# Patient Record
Sex: Female | Born: 1950 | Race: White | Hispanic: No | Smoking: Current every day smoker
Health system: Southern US, Community
[De-identification: ages and names within clinical notes are randomized; demographics above are authoritative.]

## PROBLEM LIST (undated history)

## (undated) DIAGNOSIS — H269 Unspecified cataract: Secondary | ICD-10-CM

## (undated) DIAGNOSIS — I10 Essential (primary) hypertension: Secondary | ICD-10-CM

## (undated) DIAGNOSIS — J45909 Unspecified asthma, uncomplicated: Secondary | ICD-10-CM

## (undated) DIAGNOSIS — E079 Disorder of thyroid, unspecified: Secondary | ICD-10-CM

## (undated) DIAGNOSIS — E785 Hyperlipidemia, unspecified: Secondary | ICD-10-CM

## (undated) DIAGNOSIS — M8580 Other specified disorders of bone density and structure, unspecified site: Secondary | ICD-10-CM

## (undated) DIAGNOSIS — M1990 Unspecified osteoarthritis, unspecified site: Secondary | ICD-10-CM

## (undated) DIAGNOSIS — J449 Chronic obstructive pulmonary disease, unspecified: Secondary | ICD-10-CM

## (undated) DIAGNOSIS — K219 Gastro-esophageal reflux disease without esophagitis: Secondary | ICD-10-CM

## (undated) DIAGNOSIS — C801 Malignant (primary) neoplasm, unspecified: Secondary | ICD-10-CM

## (undated) HISTORY — PX: POLYPECTOMY: SHX149

## (undated) HISTORY — PX: BREAST RECONSTRUCTION: SHX9

## (undated) HISTORY — DX: Unspecified osteoarthritis, unspecified site: M19.90

## (undated) HISTORY — PX: COLONOSCOPY: SHX174

## (undated) HISTORY — DX: Unspecified cataract: H26.9

## (undated) HISTORY — DX: Hyperlipidemia, unspecified: E78.5

## (undated) HISTORY — PX: BILATERAL TOTAL MASTECTOMY WITH AXILLARY LYMPH NODE DISSECTION: SHX6364

## (undated) HISTORY — DX: Other specified disorders of bone density and structure, unspecified site: M85.80

## (undated) HISTORY — DX: Essential (primary) hypertension: I10

## (undated) HISTORY — DX: Gastro-esophageal reflux disease without esophagitis: K21.9

## (undated) HISTORY — DX: Disorder of thyroid, unspecified: E07.9

## (undated) HISTORY — DX: Chronic obstructive pulmonary disease, unspecified: J44.9

## (undated) HISTORY — DX: Malignant (primary) neoplasm, unspecified: C80.1

---

## 1999-07-16 ENCOUNTER — Encounter: Admission: RE | Admit: 1999-07-16 | Discharge: 1999-10-14 | Payer: Self-pay | Admitting: Neurology

## 1999-09-14 ENCOUNTER — Other Ambulatory Visit: Admission: RE | Admit: 1999-09-14 | Discharge: 1999-09-14 | Payer: Self-pay | Admitting: *Deleted

## 1999-11-27 ENCOUNTER — Ambulatory Visit (HOSPITAL_BASED_OUTPATIENT_CLINIC_OR_DEPARTMENT_OTHER): Admission: RE | Admit: 1999-11-27 | Discharge: 1999-11-27 | Payer: Self-pay | Admitting: *Deleted

## 2000-04-01 ENCOUNTER — Ambulatory Visit (HOSPITAL_COMMUNITY): Admission: RE | Admit: 2000-04-01 | Discharge: 2000-04-01 | Payer: Self-pay | Admitting: *Deleted

## 2001-05-25 ENCOUNTER — Other Ambulatory Visit: Admission: RE | Admit: 2001-05-25 | Discharge: 2001-05-25 | Payer: Self-pay | Admitting: Family Medicine

## 2002-10-01 ENCOUNTER — Other Ambulatory Visit: Admission: RE | Admit: 2002-10-01 | Discharge: 2002-10-01 | Payer: Self-pay | Admitting: Family Medicine

## 2005-01-29 ENCOUNTER — Other Ambulatory Visit: Admission: RE | Admit: 2005-01-29 | Discharge: 2005-01-29 | Payer: Self-pay | Admitting: Family Medicine

## 2006-10-07 ENCOUNTER — Other Ambulatory Visit: Admission: RE | Admit: 2006-10-07 | Discharge: 2006-10-07 | Payer: Self-pay | Admitting: Family Medicine

## 2007-03-19 ENCOUNTER — Ambulatory Visit (HOSPITAL_COMMUNITY): Admission: RE | Admit: 2007-03-19 | Discharge: 2007-03-19 | Payer: Self-pay | Admitting: Family Medicine

## 2007-07-06 ENCOUNTER — Encounter: Admission: RE | Admit: 2007-07-06 | Discharge: 2007-08-03 | Payer: Self-pay | Admitting: Orthopedic Surgery

## 2011-02-23 ENCOUNTER — Emergency Department (HOSPITAL_COMMUNITY)
Admission: EM | Admit: 2011-02-23 | Discharge: 2011-02-23 | Disposition: A | Discharge status: Home / Self Care | Payer: Worker's Compensation | Attending: Emergency Medicine | Admitting: Emergency Medicine

## 2011-02-23 DIAGNOSIS — W268XXA Contact with other sharp object(s), not elsewhere classified, initial encounter: Secondary | ICD-10-CM | POA: Insufficient documentation

## 2011-02-23 DIAGNOSIS — S61209A Unspecified open wound of unspecified finger without damage to nail, initial encounter: Secondary | ICD-10-CM | POA: Insufficient documentation

## 2011-02-23 DIAGNOSIS — M79609 Pain in unspecified limb: Secondary | ICD-10-CM | POA: Insufficient documentation

## 2011-02-23 DIAGNOSIS — Y9289 Other specified places as the place of occurrence of the external cause: Secondary | ICD-10-CM | POA: Insufficient documentation

## 2011-12-13 ENCOUNTER — Encounter (HOSPITAL_COMMUNITY): Payer: Self-pay | Admitting: Emergency Medicine

## 2011-12-13 DIAGNOSIS — J45909 Unspecified asthma, uncomplicated: Secondary | ICD-10-CM | POA: Insufficient documentation

## 2011-12-13 DIAGNOSIS — M26629 Arthralgia of temporomandibular joint, unspecified side: Secondary | ICD-10-CM | POA: Insufficient documentation

## 2011-12-13 DIAGNOSIS — F17200 Nicotine dependence, unspecified, uncomplicated: Secondary | ICD-10-CM | POA: Insufficient documentation

## 2011-12-13 DIAGNOSIS — M542 Cervicalgia: Secondary | ICD-10-CM | POA: Insufficient documentation

## 2011-12-13 NOTE — ED Notes (Signed)
Patient states that all of a sudden today her neck started swelling on the left side. Complaining of pain on neck. Also states she has pain with swallowing and is beginning to get a sore throat. Denies shortness of breath.

## 2011-12-14 ENCOUNTER — Emergency Department (HOSPITAL_COMMUNITY)
Admission: EM | Admit: 2011-12-14 | Discharge: 2011-12-14 | Disposition: A | Discharge status: Home / Self Care | Payer: Managed Care, Other (non HMO) | Attending: Emergency Medicine | Admitting: Emergency Medicine

## 2011-12-14 ENCOUNTER — Emergency Department (HOSPITAL_COMMUNITY): Payer: Managed Care, Other (non HMO)

## 2011-12-14 DIAGNOSIS — R6884 Jaw pain: Secondary | ICD-10-CM

## 2011-12-14 HISTORY — DX: Unspecified asthma, uncomplicated: J45.909

## 2011-12-14 LAB — CBC WITH DIFFERENTIAL/PLATELET
Basophils Absolute: 0.1 10*3/uL (ref 0.0–0.1)
Basophils Relative: 0 % (ref 0–1)
Eosinophils Absolute: 0.2 10*3/uL (ref 0.0–0.7)
Eosinophils Relative: 2 % (ref 0–5)
HCT: 42.5 % (ref 36.0–46.0)
Hemoglobin: 14.5 g/dL (ref 12.0–15.0)
Lymphocytes Relative: 25 % (ref 12–46)
Lymphs Abs: 2.8 10*3/uL (ref 0.7–4.0)
MCH: 33.1 pg (ref 26.0–34.0)
MCHC: 34.1 g/dL (ref 30.0–36.0)
MCV: 97 fL (ref 78.0–100.0)
Monocytes Absolute: 1 10*3/uL (ref 0.1–1.0)
Monocytes Relative: 9 % (ref 3–12)
Neutro Abs: 7.4 10*3/uL (ref 1.7–7.7)
Neutrophils Relative %: 64 % (ref 43–77)
Platelets: 253 10*3/uL (ref 150–400)
RBC: 4.38 MIL/uL (ref 3.87–5.11)
RDW: 12.7 % (ref 11.5–15.5)
WBC: 11.5 10*3/uL — ABNORMAL HIGH (ref 4.0–10.5)

## 2011-12-14 LAB — BASIC METABOLIC PANEL
Chloride: 101 mEq/L (ref 96–112)
GFR calc Af Amer: >90 mL/min (ref >90)
GFR calc non Af Amer: >90 mL/min (ref >90)
Potassium: 4 mEq/L (ref 3.5–5.1)
Sodium: 138 mEq/L (ref 135–145)

## 2011-12-14 MED ORDER — ONDANSETRON HCL 4 MG/2ML IJ SOLN
4.0000 mg | Freq: Once | INTRAMUSCULAR | Status: AC
  Administered 2011-12-14: 4 mg via INTRAVENOUS
  Filled 2011-12-14: qty 2

## 2011-12-14 MED ORDER — HYDROMORPHONE HCL PF 1 MG/ML IJ SOLN
1.0000 mg | Freq: Once | INTRAMUSCULAR | Status: AC
  Administered 2011-12-14: 1 mg via INTRAVENOUS
  Filled 2011-12-14: qty 1

## 2011-12-14 MED ORDER — SODIUM CHLORIDE 0.9 % IV SOLN
INTRAVENOUS | Status: DC
  Administered 2011-12-14: 02:00:00 via INTRAVENOUS

## 2011-12-14 MED ORDER — IOHEXOL 300 MG/ML  SOLN
80.0000 mL | Freq: Once | INTRAMUSCULAR | Status: AC | PRN
  Administered 2011-12-14: 80 mL via INTRAVENOUS

## 2011-12-14 MED ORDER — PENICILLIN V POTASSIUM 500 MG PO TABS: 500.0000 mg | ORAL_TABLET | Freq: Three times a day (TID) | ORAL | Status: AC

## 2011-12-14 MED ORDER — HYDROCODONE-ACETAMINOPHEN 5-325 MG PO TABS: 1.0000 | ORAL_TABLET | Freq: Four times a day (QID) | ORAL | Status: AC | PRN

## 2011-12-14 NOTE — ED Notes (Signed)
Pt discharged. Pt stable at time of discharge. Medications reviewed pt has no questions regarding discharge at this time. Pt voiced understanding of discharge instructions.  

## 2011-12-14 NOTE — ED Notes (Signed)
States that she started having pain and swelling in her left neck yesterday at 530 pm.

## 2011-12-14 NOTE — ED Provider Notes (Signed)
History     CSN: [id number]  Arrival date & time 12/13/11  2253   First MD Initiated Contact with Patient 12/14/11 [phone]      Chief Complaint  Patient presents with  . Neck Pain    (Consider location/radiation/quality/duration/timing/severity/associated sxs/prior treatment) Patient is a 61 y.o. female presenting with neck pain. The history is provided by the patient (pt complains of left facial pan and swelling). No language interpreter was used.  Neck Pain  This is a new problem. The current episode started 12 to 24 hours ago. The problem occurs constantly. The problem has not changed since onset.The pain is associated with nothing. There has been no fever. Pain location: left tmj. The quality of the pain is described as aching. The pain is at a severity of 4/10. The pain is moderate. The symptoms are aggravated by position. Pertinent negatives include no chest pain and no headaches.    Past Medical History  Diagnosis Date  . Asthma     History reviewed. No pertinent past surgical history.  History reviewed. No pertinent family history.  History  Substance Use Topics  . Smoking status: Current Everyday Smoker  . Smokeless tobacco: Not on file  . Alcohol Use: No    OB History    Grav Para Term Preterm Abortions TAB SAB Ect Mult Living                  Review of Systems  Constitutional: Negative for fatigue.  HENT: Positive for neck pain. Negative for congestion, sinus pressure and ear discharge.   Eyes: Negative for discharge.  Respiratory: Negative for cough.   Cardiovascular: Negative for chest pain.  Gastrointestinal: Negative for abdominal pain and diarrhea.  Genitourinary: Negative for frequency and hematuria.  Musculoskeletal: Negative for back pain.  Skin: Negative for rash.  Neurological: Negative for seizures and headaches.  Hematological: Negative.   Psychiatric/Behavioral: Negative for hallucinations.    Allergies  Aspirin  Home Medications    Current Outpatient Rx  Name Route Sig Dispense Refill  . HYDROCODONE-ACETAMINOPHEN 5-325 MG PO TABS Oral Take 1 tablet by mouth every 6 (six) hours as needed for pain. 20 tablet 0  . PENICILLIN V POTASSIUM 500 MG PO TABS Oral Take 1 tablet (500 mg total) by mouth 3 (three) times daily. 30 tablet 0    BP 139/66  Pulse 70  Temp 97.6 F (36.4 C) (Oral)  Resp 14  Ht 5\' 2"  (1.575 m)  Wt 165 lb (74.844 kg)  BMI 30.18 kg/m2  SpO2 100%  Physical Exam  Constitutional: She is oriented to person, place, and time. She appears well-developed.  HENT:  Head: Normocephalic and atraumatic.       Tender left tmj  Eyes: Conjunctivae and EOM are normal. No scleral icterus.  Neck: Neck supple. No thyromegaly present.  Cardiovascular: Normal rate and regular rhythm.  Exam reveals no gallop and no friction rub.   No murmur heard. Pulmonary/Chest: No stridor. She has no wheezes. She has no rales. She exhibits no tenderness.  Abdominal: She exhibits no distension. There is no tenderness. There is no rebound.  Musculoskeletal: Normal range of motion. She exhibits no edema.  Lymphadenopathy:    She has no cervical adenopathy.  Neurological: She is oriented to person, place, and time. Coordination normal.  Skin: No rash noted. No erythema.  Psychiatric: She has a normal mood and affect. Her behavior is normal.    ED Course  Procedures (including critical care time)  Labs Reviewed  CBC WITH DIFFERENTIAL - Abnormal; Notable for the following:    WBC 11.5 (*)     All other components within normal limits  BASIC METABOLIC PANEL   Ct Soft Tissue Neck W Contrast  12/14/2011  *RADIOLOGY REPORT*  Clinical Data: Left neck pain and swelling.  CT NECK WITH CONTRAST  Technique:  Multidetector CT imaging of the neck was performed with intravenous contrast.  Contrast: 80mL OMNIPAQUE IOHEXOL 300 MG/ML  SOLN  Comparison: None.  Findings: Visualized intracranial contents are within normal limits.  Visualized  sinuses are clear.  Lung apices are clear.  There is a significant amount of streak artifact from the patient's dental amalgam, resulting in limited visualization of the oral cavity and oropharynx.  Unremarkable nasopharynx and nasal cavity. No definite peritonsillar abscess.  There is mild left palatine tonsillar fullness however the airway remains patent.  Unremarkable epiglottis and hypopharynx.  Unremarkable larynx.  No dominant thyroid nodule.  Patent vasculature. The left internal carotid artery appears to course posteriorly at the upper cervical level. Scattered atherosclerosis.  Symmetric parotid and submandibular glands.  No lymphadenopathy.  No acute osseous finding.  IMPRESSION: Streak artifact from dental amalgam limits oropharyngeal evaluation.  Within this limitation, there is mild left tonsillar fullness however no peritonsillar abscess or significant airway narrowing identified. Consider correlation with direct inspection.  Original Report Authenticated By: Waneta Martins, M.D.     1. Jaw pain       MDM          Benny Lennert, MD 12/14/11 [phone]

## 2012-03-04 DIAGNOSIS — E785 Hyperlipidemia, unspecified: Secondary | ICD-10-CM | POA: Insufficient documentation

## 2012-08-10 ENCOUNTER — Other Ambulatory Visit: Payer: Self-pay | Admitting: *Deleted

## 2012-08-10 ENCOUNTER — Other Ambulatory Visit: Payer: Self-pay

## 2012-08-10 MED ORDER — MELOXICAM 15 MG PO TABS: 15.0000 mg | ORAL_TABLET | Freq: Every day | ORAL | Status: DC

## 2012-08-10 NOTE — Telephone Encounter (Signed)
Not on med list

## 2012-08-10 NOTE — Telephone Encounter (Signed)
Opened in error

## 2012-10-09 ENCOUNTER — Ambulatory Visit: Payer: Self-pay | Admitting: Physician Assistant

## 2012-10-18 ENCOUNTER — Other Ambulatory Visit: Payer: Self-pay | Admitting: Nurse Practitioner

## 2012-10-22 ENCOUNTER — Telehealth: Payer: Self-pay | Admitting: Nurse Practitioner

## 2012-10-22 NOTE — Telephone Encounter (Signed)
appt given  

## 2012-10-23 ENCOUNTER — Ambulatory Visit (INDEPENDENT_AMBULATORY_CARE_PROVIDER_SITE_OTHER): Payer: 59 | Admitting: Family Medicine

## 2012-10-23 ENCOUNTER — Encounter: Payer: Self-pay | Admitting: Family Medicine

## 2012-10-23 VITALS — BP 123/69 | HR 74 | Temp 98.2°F | Wt 169.2 lb

## 2012-10-23 DIAGNOSIS — D492 Neoplasm of unspecified behavior of bone, soft tissue, and skin: Secondary | ICD-10-CM | POA: Insufficient documentation

## 2012-10-23 DIAGNOSIS — M1990 Unspecified osteoarthritis, unspecified site: Secondary | ICD-10-CM

## 2012-10-23 DIAGNOSIS — E0500 Thyrotoxicosis with diffuse goiter without thyrotoxic crisis or storm: Secondary | ICD-10-CM | POA: Insufficient documentation

## 2012-10-23 DIAGNOSIS — G43909 Migraine, unspecified, not intractable, without status migrainosus: Secondary | ICD-10-CM

## 2012-10-23 DIAGNOSIS — F4389 Other reactions to severe stress: Secondary | ICD-10-CM

## 2012-10-23 DIAGNOSIS — M129 Arthropathy, unspecified: Secondary | ICD-10-CM

## 2012-10-23 DIAGNOSIS — J449 Chronic obstructive pulmonary disease, unspecified: Secondary | ICD-10-CM | POA: Insufficient documentation

## 2012-10-23 DIAGNOSIS — F438 Other reactions to severe stress: Secondary | ICD-10-CM

## 2012-10-23 DIAGNOSIS — F4329 Adjustment disorder with other symptoms: Secondary | ICD-10-CM

## 2012-10-23 DIAGNOSIS — J4489 Other specified chronic obstructive pulmonary disease: Secondary | ICD-10-CM | POA: Insufficient documentation

## 2012-10-23 DIAGNOSIS — J441 Chronic obstructive pulmonary disease with (acute) exacerbation: Secondary | ICD-10-CM

## 2012-10-23 DIAGNOSIS — R059 Cough, unspecified: Secondary | ICD-10-CM

## 2012-10-23 DIAGNOSIS — E785 Hyperlipidemia, unspecified: Secondary | ICD-10-CM

## 2012-10-23 DIAGNOSIS — R05 Cough: Secondary | ICD-10-CM

## 2012-10-23 MED ORDER — METHIMAZOLE 5 MG PO TABS: ORAL_TABLET | ORAL | Status: DC

## 2012-10-23 MED ORDER — SUMATRIPTAN SUCCINATE 100 MG PO TABS: 100.0000 mg | ORAL_TABLET | ORAL | Status: DC | PRN

## 2012-10-23 MED ORDER — ESCITALOPRAM OXALATE 10 MG PO TABS: 10.0000 mg | ORAL_TABLET | Freq: Every day | ORAL | Status: DC

## 2012-10-23 MED ORDER — SIMVASTATIN 40 MG PO TABS: 20.0000 mg | ORAL_TABLET | Freq: Every day | ORAL | Status: DC

## 2012-10-23 MED ORDER — FLUTICASONE-SALMETEROL 250-50 MCG/DOSE IN AEPB: 1.0000 | INHALATION_SPRAY | Freq: Two times a day (BID) | RESPIRATORY_TRACT | Status: DC

## 2012-10-23 MED ORDER — MELOXICAM 15 MG PO TABS: 15.0000 mg | ORAL_TABLET | Freq: Every day | ORAL | Status: DC

## 2012-10-23 MED ORDER — TIOTROPIUM BROMIDE MONOHYDRATE 18 MCG IN CAPS: 18.0000 ug | ORAL_CAPSULE | Freq: Every day | RESPIRATORY_TRACT | Status: DC

## 2012-10-23 NOTE — Progress Notes (Signed)
Patient ID: Barbara Boyd, female   DOB: [birth year], 62 y.o.   MRN: [id number] SUBJECTIVE: Chief Complaint  Patient presents with  . Follow-up    refill meds wants labs ck place on rt hand   HPI: Patient is here for follow up of hyperlipidemia:  denies Headache;denies Chest Pain;denies weakness;denies Shortness of Breath and orthopnea;denies Visual changes;denies palpitations;denies cough;denies pedal edema;denies symptoms of TIA or stroke;deniesClaudication symptoms. admits to Compliance with medications; denies Problems with medications.  Has history of migraines , which has been stable.  Has a skin lesion on the back of the right hand. Has  A history of skin cancer.  History of Grave's disease. Stable on present medication.  Copd stable with inhalers.    PMH/PSH: reviewed/updated in Epic  SH/FH: reviewed/updated in Epic  Allergies: reviewed/updated in Epic  Medications: reviewed/updated in Epic  Immunizations: reviewed/updated in Epic  ROS: As above in the HPI. All other systems are stable or negative.  OBJECTIVE: APPEARANCE:  Patient in no acute distress.The patient appeared well nourished and normally developed. Acyanotic. Waist: VITAL SIGNS:BP 123/69  Pulse 74  Temp(Src) 98.2 F (36.8 C)  Wt 169 lb 3.2 oz (76.749 kg)  BMI 30.94 kg/m2  WF NAD overweight  SKIN: warm and  Dry without overt rashes, tattoos and scars  HEAD and Neck: without JVD, Head and scalp: normal Eyes:No scleral icterus. Fundi normal, eye movements normal. Ears: Auricle normal, canal normal, Tympanic membranes normal, insufflation normal. Nose: normal Throat: normal Neck & thyroid: normal  CHEST & LUNGS: Chest wall: normal Lungs: Coarse bs.  CVS: Reveals the PMI to be normally located. Regular rhythm, First and Second Heart sounds are normal,  absence of murmurs, rubs or gallops. Peripheral vasculature: Radial pulses: normal Dorsal pedis pulses: normal Posterior pulses:  normal  ABDOMEN:  Appearance: normal Benign, no organomegaly, no masses, no Abdominal Aortic enlargement. No Guarding , no rebound. No Bruits. Bowel sounds: normal  RECTAL: N/A GU: N/A  EXTREMETIES: nonedematous. Both Femoral and Pedal pulses are normal.  MUSCULOSKELETAL:  Spine: normal Joints: intact  NEUROLOGIC: oriented to time,place and person; nonfocal. Strength is normal Sensory is normal Reflexes are normal Cranial Nerves are normal.  ASSESSMENT: Graves disease - Plan: Thyroid Panel With TSH, COMPLETE METABOLIC PANEL WITH GFR, methimazole (TAPAZOLE) 5 MG tablet  HLD (hyperlipidemia) - Plan: COMPLETE METABOLIC PANEL WITH GFR, NMR Lipoprofile with Lipids, simvastatin (ZOCOR) 40 MG tablet  Neoplasm of skin of hand - right  COPD exacerbation - Plan: tiotropium (SPIRIVA HANDIHALER) 18 MCG inhalation capsule, Fluticasone-Salmeterol (ADVAIR) 250-50 MCG/DOSE AEPB  Migraine - Plan: SUMAtriptan (IMITREX) 100 MG tablet  Cough - Plan: DG Chest 2 View  Arthritis - Plan: meloxicam (MOBIC) 15 MG tablet  Stress and adjustment reaction - Plan: escitalopram (LEXAPRO) 10 MG tablet tobacco use PLAN: [facility] reading (PRIMARY) by  Dr.Fergie Sherbert:film not seen. Await official reading.  Orders Placed This Encounter  Procedures  . DG Chest 2 View    Standing Status: Future     Number of Occurrences:      Standing Expiration Date: 12/23/2013    Order Specific Question:  Reason for Exam (SYMPTOM  OR DIAGNOSIS REQUIRED)    Answer:  smoker, cough    Order Specific Question:  Preferred imaging location?    Answer:  Internal  . Thyroid Panel With TSH  . COMPLETE METABOLIC PANEL WITH GFR  . NMR Lipoprofile with Lipids   Meds ordered this encounter  Medications  . DISCONTD: diclofenac (VOLTAREN) 75 MG EC tablet  Sig: Take 75 mg by mouth 2 (two) times daily.   Marland Kitchen DISCONTD: escitalopram (LEXAPRO) 10 MG tablet    Sig: Take 10 mg by mouth daily.   Marland Kitchen DISCONTD: simvastatin (ZOCOR) 40 MG tablet     Sig: Take 20 mg by mouth at bedtime.   Marland Kitchen DISCONTD: SUMAtriptan (IMITREX) 100 MG tablet    Sig: Take 100 mg by mouth every 2 (two) hours as needed.   Marland Kitchen DISCONTD: SPIRIVA HANDIHALER 18 MCG inhalation capsule    Sig: Place 18 mcg into inhaler and inhale daily.   Marland Kitchen DISCONTD: Fluticasone-Salmeterol (ADVAIR) 250-50 MCG/DOSE AEPB    Sig: Inhale 1 puff into the lungs every 12 (twelve) hours.  . methimazole (TAPAZOLE) 5 MG tablet    Sig: TAKE 1/2 TABLET BY MOUTH DAILY    Dispense:  30 tablet    Refill:  5    Must be seen before next refill  . meloxicam (MOBIC) 15 MG tablet    Sig: Take 1 tablet (15 mg total) by mouth daily.    Dispense:  30 tablet    Refill:  2  . escitalopram (LEXAPRO) 10 MG tablet    Sig: Take 1 tablet (10 mg total) by mouth daily.    Dispense:  30 tablet    Refill:  5  . simvastatin (ZOCOR) 40 MG tablet    Sig: Take 0.5 tablets (20 mg total) by mouth at bedtime.    Dispense:  30 tablet    Refill:  5  . SUMAtriptan (IMITREX) 100 MG tablet    Sig: Take 1 tablet (100 mg total) by mouth every 2 (two) hours as needed. Only take 2 doses    Dispense:  10 tablet    Refill:  3  . tiotropium (SPIRIVA HANDIHALER) 18 MCG inhalation capsule    Sig: Place 1 capsule (18 mcg total) into inhaler and inhale daily.    Dispense:  30 capsule    Refill:  5  . Fluticasone-Salmeterol (ADVAIR) 250-50 MCG/DOSE AEPB    Sig: Inhale 1 puff into the lungs every 12 (twelve) hours.    Dispense:  60 each    Refill:  5  smoking cessation counseling and handout in the [facility].      Dr Woodroe Mode Recommendations  Diet and Exercise discussed with patient.  For nutrition information, I recommend books:  1).Eat to Live by Dr Monico Hoar. 2).Prevent and Reverse Heart Disease by Dr Suzzette Righter. 3) Dr Katherina Right Book: Reversing Diabetes  Exercise recommendations are:  If unable to walk, then the patient can exercise in a chair 3 times a day. By flapping arms like a bird gently and  raising legs outwards to the front.  If ambulatory, the patient can go for walks for 30 minutes 3 times a week. Then increase the intensity and duration as tolerated.  Goal is to try to attain exercise frequency to 5 times a week.  If applicable: Best to perform resistance exercises (machines or weights) 2 days a week and cardio type exercises 3 days per week.  Patient to self  Refer to her Dermatologist in [place] Patient to self  Refer to her endocrinologist at [facility]. She will let us know if she needs new referrals. espeially since she is not sure if her specialists are stil in the area practicing.  Return in about 3 months (around 01/23/2013) for Recheck medical problems.   Alessa Mazur P. [place] Charon, M.D.

## 2012-10-23 NOTE — Patient Instructions (Addendum)
Dr Woodroe Mode Recommendations  Diet and Exercise discussed with patient.  For nutrition information, I recommend books:  1).Eat to Live by Dr Monico Hoar. 2).Prevent and Reverse Heart Disease by Dr Suzzette Righter. 3) Dr Katherina Right Book: Reversing Diabetes  Exercise recommendations are:  If unable to walk, then the patient can exercise in a chair 3 times a day. By flapping arms like a bird gently and raising legs outwards to the front.  If ambulatory, the patient can go for walks for 30 minutes 3 times a week. Then increase the intensity and duration as tolerated.  Goal is to try to attain exercise frequency to 5 times a week.  If applicable: Best to perform resistance exercises (machines or weights) 2 days a week and cardio type exercises 3 days per week.   Smoking Cessation Quitting smoking is important to your health and has many advantages. However, it is not always easy to quit since nicotine is a very addictive drug. Often times, people try 3 times or more before being able to quit. This document explains the best ways for you to prepare to quit smoking. Quitting takes hard work and a lot of effort, but you can do it. ADVANTAGES OF QUITTING SMOKING  You will live longer, feel better, and live better.  Your body will feel the impact of quitting smoking almost immediately.  Within 20 minutes, blood pressure decreases. Your pulse returns to its normal level.  After 8 hours, carbon monoxide levels in the blood return to normal. Your oxygen level increases.  After 24 hours, the chance of having a heart attack starts to decrease. Your breath, hair, and body stop smelling like smoke.  After 48 hours, damaged nerve endings begin to recover. Your sense of taste and smell improve.  After 72 hours, the body is virtually free of nicotine. Your bronchial tubes relax and breathing becomes easier.  After 2 to 12 weeks, lungs can hold more air. Exercise becomes easier  and circulation improves.  The risk of having a heart attack, stroke, cancer, or lung disease is greatly reduced.  After 1 year, the risk of coronary heart disease is cut in half.  After 5 years, the risk of stroke falls to the same as a nonsmoker.  After 10 years, the risk of lung cancer is cut in half and the risk of other cancers decreases significantly.  After 15 years, the risk of coronary heart disease drops, usually to the level of a nonsmoker.  If you are pregnant, quitting smoking will improve your chances of having a healthy baby.  The people you live with, especially any children, will be healthier.  You will have extra money to spend on things other than cigarettes. QUESTIONS TO THINK ABOUT BEFORE ATTEMPTING TO QUIT You may want to talk about your answers with your caregiver.  Why do you want to quit?  If you tried to quit in the past, what helped and what did not?  What will be the most difficult situations for you after you quit? How will you plan to handle them?  Who can help you through the tough times? Your family? Friends? A caregiver?  What pleasures do you get from smoking? What ways can you still get pleasure if you quit? Here are some questions to ask your caregiver:  How can you help me to be successful at quitting?  What medicine do you think would be best for me and how should I take it?  What should I do if I need more help?  What is smoking withdrawal like? How can I get information on withdrawal? GET READY  Set a quit date.  Change your environment by getting rid of all cigarettes, ashtrays, matches, and lighters in your home, car, or work. Do not let people smoke in your home.  Review your past attempts to quit. Think about what worked and what did not. GET SUPPORT AND ENCOURAGEMENT You have a better chance of being successful if you have help. You can get support in many ways.  Tell your family, friends, and co-workers that you are going  to quit and need their support. Ask them not to smoke around you.  Get individual, group, or telephone counseling and support. Programs are available at [employer] and health centers. Call your local health department for information about programs in your area.  Spiritual beliefs and practices may help some smokers quit.  Download a "quit meter" on your computer to keep track of quit statistics, such as how long you have gone without smoking, cigarettes not smoked, and money saved.  Get a self-help book about quitting smoking and staying off of tobacco. LEARN NEW SKILLS AND BEHAVIORS  Distract yourself from urges to smoke. Talk to someone, go for a walk, or occupy your time with a task.  Change your normal routine. Take a different route to work. Drink tea instead of coffee. Eat breakfast in a different place.  Reduce your stress. Take a hot bath, exercise, or read a book.  Plan something enjoyable to do every day. Reward yourself for not smoking.  Explore interactive web-based programs that specialize in helping you quit. GET MEDICINE AND USE IT CORRECTLY Medicines can help you stop smoking and decrease the urge to smoke. Combining medicine with the above behavioral methods and support can greatly increase your chances of successfully quitting smoking.  Nicotine replacement therapy helps deliver nicotine to your body without the negative effects and risks of smoking. Nicotine replacement therapy includes nicotine gum, lozenges, inhalers, nasal sprays, and skin patches. Some may be available over-the-counter and others require a prescription.  Antidepressant medicine helps people abstain from smoking, but how this works is unknown. This medicine is available by prescription.  Nicotinic receptor partial agonist medicine simulates the effect of nicotine in your brain. This medicine is available by prescription. Ask your caregiver for advice about which medicines to use and how to use  them based on your health history. Your caregiver will tell you what side effects to look out for if you choose to be on a medicine or therapy. Carefully read the information on the package. Do not use any other product containing nicotine while using a nicotine replacement product.  RELAPSE OR DIFFICULT SITUATIONS Most relapses occur within the first 3 months after quitting. Do not be discouraged if you start smoking again. Remember, most people try several times before finally quitting. You may have symptoms of withdrawal because your body is used to nicotine. You may crave cigarettes, be irritable, feel very hungry, cough often, get headaches, or have difficulty concentrating. The withdrawal symptoms are only temporary. They are strongest when you first quit, but they will go away within 10 14 days. To reduce the chances of relapse, try to:  Avoid drinking alcohol. Drinking lowers your chances of successfully quitting.  Reduce the amount of caffeine you consume. Once you quit smoking, the amount of caffeine in your body increases and can give you symptoms, such as a  rapid heartbeat, sweating, and anxiety.  Avoid smokers because they can make you want to smoke.  Do not let weight gain distract you. Many smokers will gain weight when they quit, usually less than 10 pounds. Eat a healthy diet and stay active. You can always lose the weight gained after you quit.  Find ways to improve your mood other than smoking. FOR MORE INFORMATION  www.smokefree.gov  Document Released: 04/30/2001 Document Revised: 11/05/2011 Document Reviewed: 08/15/2011 [facility] Patient Information 2014 [place], [place].   See your dermatologist.

## 2012-10-24 LAB — COMPLETE METABOLIC PANEL WITH GFR
ALT: 15 U/L (ref 0–35)
AST: 18 U/L (ref 0–37)
Albumin: 3.8 g/dL (ref 3.5–5.2)
Alkaline Phosphatase: 66 U/L (ref 39–117)
BUN: 11 mg/dL (ref 6–23)
CO2: 28 mEq/L (ref 19–32)
Calcium: 9.3 mg/dL (ref 8.4–10.5)
Chloride: 102 mEq/L (ref 96–112)
Creat: 0.65 mg/dL (ref 0.50–1.10)
GFR, Est African American: >89 mL/min
GFR, Est Non African American: >89 mL/min
Glucose, Bld: 83 mg/dL (ref 70–99)
Potassium: 4 mEq/L (ref 3.5–5.3)
Sodium: 138 mEq/L (ref 135–145)
Total Bilirubin: 0.3 mg/dL (ref 0.3–1.2)
Total Protein: 6.5 g/dL (ref 6.0–8.3)

## 2012-10-24 LAB — THYROID PANEL WITH TSH
Free Thyroxine Index: 3.2 (ref 1.0–3.9)
T3 Uptake: 34.2 % (ref 22.5–37.0)
T4, Total: 9.3 ug/dL (ref 5.0–12.5)
TSH: 1.037 u[IU]/mL (ref 0.350–4.500)

## 2012-10-26 ENCOUNTER — Ambulatory Visit: Payer: Self-pay | Admitting: Nurse Practitioner

## 2012-10-27 LAB — NMR LIPOPROFILE WITH LIPIDS
Cholesterol, Total: 152 mg/dL (ref <200)
HDL Particle Number: 41.6 umol/L (ref >=30.5)
HDL Size: 9.9 nm (ref >=9.2)
HDL-C: 59 mg/dL (ref >=40)
LDL (calc): 80 mg/dL (ref <100)
LDL Particle Number: 1074 nmol/L — ABNORMAL HIGH (ref <1000)
LDL Size: 20.9 nm (ref >20.5)
LP-IR Score: 29 (ref <=45)
Large HDL-P: 9.3 umol/L (ref >=4.8)
Large VLDL-P: 1.5 nmol/L (ref <=2.7)
Small LDL Particle Number: 527 nmol/L (ref <=527)
Triglycerides: 67 mg/dL (ref <150)
VLDL Size: 46.7 nm — ABNORMAL HIGH (ref <=46.6)

## 2012-10-27 NOTE — Progress Notes (Signed)
Quick Note:  Lab result at goal or close to goal No change in Medications for now. No Change in plans and follow up. ______

## 2012-10-28 ENCOUNTER — Other Ambulatory Visit (INDEPENDENT_AMBULATORY_CARE_PROVIDER_SITE_OTHER): Payer: 59

## 2012-10-28 DIAGNOSIS — R059 Cough, unspecified: Secondary | ICD-10-CM

## 2012-10-28 DIAGNOSIS — R05 Cough: Secondary | ICD-10-CM

## 2012-10-28 NOTE — Progress Notes (Signed)
Quick Note:  Call patient. Chest Xray normal No change in plan. ______

## 2012-10-28 NOTE — Progress Notes (Signed)
Patient ID: Barbara Boyd, female   DOB: [birth year], 62 y.o.   MRN: [id number] [facility] reading (PRIMARY) by  Dr.Halyn Flaugher Preliminary reading: No acute findings. Await over read.           [name] P. [place] Charon, M.D.

## 2012-11-16 ENCOUNTER — Telehealth: Payer: Self-pay | Admitting: Nurse Practitioner

## 2012-11-16 NOTE — Telephone Encounter (Signed)
Sore has been there for a couple of weeks offered patient appt for Tues and she said she had another appt and would call us back tomorrow to try and set something up

## 2012-12-07 ENCOUNTER — Encounter: Payer: Self-pay | Admitting: Gastroenterology

## 2013-01-28 ENCOUNTER — Encounter: Payer: Self-pay | Admitting: Family Medicine

## 2013-01-28 ENCOUNTER — Ambulatory Visit (INDEPENDENT_AMBULATORY_CARE_PROVIDER_SITE_OTHER): Payer: 59 | Admitting: Family Medicine

## 2013-01-28 VITALS — BP 120/67 | HR 71 | Temp 97.3°F | Ht 63.0 in | Wt 172.6 lb

## 2013-01-28 DIAGNOSIS — E785 Hyperlipidemia, unspecified: Secondary | ICD-10-CM

## 2013-01-28 DIAGNOSIS — M1990 Unspecified osteoarthritis, unspecified site: Secondary | ICD-10-CM | POA: Insufficient documentation

## 2013-01-28 DIAGNOSIS — Z720 Tobacco use: Secondary | ICD-10-CM

## 2013-01-28 DIAGNOSIS — E0500 Thyrotoxicosis with diffuse goiter without thyrotoxic crisis or storm: Secondary | ICD-10-CM

## 2013-01-28 DIAGNOSIS — M129 Arthropathy, unspecified: Secondary | ICD-10-CM

## 2013-01-28 DIAGNOSIS — G43909 Migraine, unspecified, not intractable, without status migrainosus: Secondary | ICD-10-CM

## 2013-01-28 DIAGNOSIS — F17200 Nicotine dependence, unspecified, uncomplicated: Secondary | ICD-10-CM

## 2013-01-28 DIAGNOSIS — J4489 Other specified chronic obstructive pulmonary disease: Secondary | ICD-10-CM

## 2013-01-28 DIAGNOSIS — J45909 Unspecified asthma, uncomplicated: Secondary | ICD-10-CM

## 2013-01-28 DIAGNOSIS — J449 Chronic obstructive pulmonary disease, unspecified: Secondary | ICD-10-CM

## 2013-01-28 MED ORDER — SIMVASTATIN 20 MG PO TABS: 20.0000 mg | ORAL_TABLET | Freq: Every day | ORAL | Status: DC

## 2013-01-28 NOTE — Progress Notes (Signed)
Patient ID: Barbara Boyd, female   DOB: 1950/10/15, 62 y.o.   MRN: [id number] SUBJECTIVE: CC: Chief Complaint  Patient presents with  . Follow-up    3 month follow uip had coffee with added lmilk. ck spot on left uppper thigh has appt in noiv with dermatalogist    HPI: Patient is here for follow up of hyperlipidemia/copd/Graves/: denies Headache;denies Chest Pain;denies weakness;denies Shortness of Breath and orthopnea;denies Visual changes;denies palpitations;denies cough;denies pedal edema;denies symptoms of TIA or stroke;deniesClaudication symptoms. admits to Compliance with medications; denies Problems with medications.  Sees an endocrinologist in [place] He told her a year ago everything was fine(per patient)  Saw Dermatology in [place].lesion on the left thigh is a chronic skin problem and the cream Rx did not work.  Continues to smoke due to stress. Smokes 2 PPD. Smoked 1PPD for 40 years.  Past Medical History  Diagnosis Date  . Arthritis   . Asthma   . COPD (chronic obstructive pulmonary disease)   . Hyperlipidemia    No past surgical history on file. History   Social History  . Marital Status: Married    Spouse Name: N/A    Number of Children: N/A  . Years of Education: N/A   Occupational History  . Not on file.   Social History Main Topics  . Smoking status: Current Every Day Smoker -- 1.00 packs/day    Types: Cigarettes    Start date: 01/28/1973  . Smokeless tobacco: Not on file  . Alcohol Use: No  . Drug Use: Not on file  . Sexual Activity: Not on file   Other Topics Concern  . Not on file   Social History Narrative  . No narrative on file   No family history on file. Current Outpatient Prescriptions on File Prior to Visit  Medication Sig Dispense Refill  . escitalopram (LEXAPRO) 10 MG tablet Take 1 tablet (10 mg total) by mouth daily.  30 tablet  5  . Fluticasone-Salmeterol (ADVAIR) 250-50 MCG/DOSE AEPB Inhale 1 puff into the lungs every 12 (twelve)  hours.  60 each  5  . meloxicam (MOBIC) 15 MG tablet Take 1 tablet (15 mg total) by mouth daily.  30 tablet  2  . methimazole (TAPAZOLE) 5 MG tablet TAKE 1/2 TABLET BY MOUTH DAILY  30 tablet  5  . simvastatin (ZOCOR) 40 MG tablet Take 0.5 tablets (20 mg total) by mouth at bedtime.  30 tablet  5  . SUMAtriptan (IMITREX) 100 MG tablet Take 1 tablet (100 mg total) by mouth every 2 (two) hours as needed. Only take 2 doses  10 tablet  3  . tiotropium (SPIRIVA HANDIHALER) 18 MCG inhalation capsule Place 1 capsule (18 mcg total) into inhaler and inhale daily.  30 capsule  5   No current facility-administered medications on file prior to visit.   Allergies  Allergen Reactions  . Aspirin Swelling and Rash    There is no immunization history on file for this patient. Prior to Admission medications   Medication Sig Start Date End Date Taking? Authorizing Provider  escitalopram (LEXAPRO) 10 MG tablet Take 1 tablet (10 mg total) by mouth daily. 10/23/12  Yes Ileana Ladd, MD  Fluticasone-Salmeterol (ADVAIR) 250-50 MCG/DOSE AEPB Inhale 1 puff into the lungs every 12 (twelve) hours. 10/23/12  Yes Ileana Ladd, MD  meloxicam (MOBIC) 15 MG tablet Take 1 tablet (15 mg total) by mouth daily. 10/23/12  Yes Ileana Ladd, MD  methimazole (TAPAZOLE) 5 MG tablet TAKE 1/2  TABLET BY MOUTH DAILY 10/23/12  Yes Ileana Ladd, MD  simvastatin (ZOCOR) 40 MG tablet Take 0.5 tablets (20 mg total) by mouth at bedtime. 10/23/12  Yes Ileana Ladd, MD  SUMAtriptan (IMITREX) 100 MG tablet Take 1 tablet (100 mg total) by mouth every 2 (two) hours as needed. Only take 2 doses 10/23/12  Yes Ileana Ladd, MD  tiotropium (SPIRIVA HANDIHALER) 18 MCG inhalation capsule Place 1 capsule (18 mcg total) into inhaler and inhale daily. 10/23/12  Yes Ileana Ladd, MD     ROS: As above in the HPI. All other systems are stable or negative.  OBJECTIVE: APPEARANCE:  Patient in no acute distress.The patient appeared well nourished and  normally developed. Acyanotic. Waist: VITAL SIGNS:BP 120/67  Pulse 71  Temp(Src) 97.3 F (36.3 C) (Oral)  Ht 5\' 3"  (1.6 m)  Wt 172 lb 9.6 oz (78.291 kg)  BMI 30.58 kg/m2 WF Smells of tobacco  SKIN: warm and  Dry without overt rashes, tattoos and scars.left red papule 7 mm diameter with superficial scale on the left thigh  HEAD and Neck: without JVD, Head and scalp: normal Eyes:No scleral icterus. Fundi normal, eye movements normal. Ears: Auricle normal, canal normal, Tympanic membranes normal, insufflation normal. Nose: normal Throat: normal Neck & thyroid: normal  CHEST & LUNGS: Chest wall: normal Lungs: Clear  CVS: Reveals the PMI to be normally located. Regular rhythm, First and Second Heart sounds are normal,  absence of murmurs, rubs or gallops. Peripheral vasculature: Radial pulses: normal Dorsal pedis pulses: normal Posterior pulses: normal  ABDOMEN:  Appearance: obese Benign, no organomegaly, no masses, no Abdominal Aortic enlargement. No Guarding , no rebound. No Bruits. Bowel sounds: normal  RECTAL: N/A GU: N/A  EXTREMETIES: nonedematous.  MUSCULOSKELETAL:  Spine: normal Joints: intact  NEUROLOGIC: oriented to time,place and person; nonfocal. Strength is normal Sensory is normal Reflexes are normal Cranial Nerves are normal.  Results for orders placed in visit on 10/23/12  THYROID PANEL WITH TSH      Result Value Range   T4, Total 9.3  5.0 - 12.5 ug/dL   T3 Uptake 16.1  09.6 - 37.0 %   Free Thyroxine Index 3.2  1.0 - 3.9   TSH 1.037  0.350 - 4.500 uIU/mL  COMPLETE METABOLIC PANEL WITH GFR      Result Value Range   Sodium 138  135 - 145 mEq/L   Potassium 4.0  3.5 - 5.3 mEq/L   Chloride 102  96 - 112 mEq/L   CO2 28  19 - 32 mEq/L   Glucose, Bld 83  70 - 99 mg/dL   BUN 11  6 - 23 mg/dL   Creat 0.45  4.09 - 8.11 mg/dL   Total Bilirubin 0.3  0.3 - 1.2 mg/dL   Alkaline Phosphatase 66  39 - 117 U/L   AST 18  0 - 37 U/L   ALT 15  0 - 35 U/L    Total Protein 6.5  6.0 - 8.3 g/dL   Albumin 3.8  3.5 - 5.2 g/dL   Calcium 9.3  8.4 - 91.4 mg/dL   GFR, Est African American >89     GFR, Est Non African American >89    NMR LIPOPROFILE WITH LIPIDS      Result Value Range   LDL Particle Number [id number] (*) <1000 nmol/L   LDL (calc) 80  <100 mg/dL   HDL-C 59  >=78 mg/dL   Triglycerides 67  <295 mg/dL   Cholesterol,  Total 152  <200 mg/dL   HDL Particle Number 45.4  >=09.8 umol/L   Large HDL-P 9.3  >=4.8 umol/L   Large VLDL-P 1.5  <=2.7 nmol/L   Small LDL Particle Number [id number]  <=527 nmol/L   LDL Size 20.9  >20.5 nm   HDL Size 9.9  >=9.2 nm   VLDL Size 46.7 (*) <=46.6 nm   LP-IR Score 29  <=45    ASSESSMENT: Hyperlipidemia - Plan: CMP14+EGFR, NMR, lipoprofile  COPD (chronic obstructive pulmonary disease)  Graves disease - Plan: Thyroid Panel With TSH  Migraine  Asthma  Arthritis  HLD (hyperlipidemia) - Plan: simvastatin (ZOCOR) 20 MG tablet  Tobacco user - Plan: CT Chest Wo Contrast   PLAN:      Dr Woodroe Mode Recommendations  For nutrition information, I recommend books:  1).Eat to Live by Dr Monico Hoar. 2).Prevent and Reverse Heart Disease by Dr Suzzette Righter. 3) Dr Katherina Right Book:  Program to Reverse Diabetes  Exercise recommendations are:  If unable to walk, then the patient can exercise in a chair 3 times a day. By flapping arms like a bird gently and raising legs outwards to the front.  If ambulatory, the patient can go for walks for 30 minutes 3 times a week. Then increase the intensity and duration as tolerated.  Goal is to try to attain exercise frequency to 5 times a week.  If applicable: Best to perform resistance exercises (machines or weights) 2 days a week and cardio type exercises 3 days per week.   Smoking cessation counseled. Stress management.. Handout in AVS.  Patient to follow up with endocrinologist and Dermatology.  Orders Placed This Encounter  Procedures  . CT Chest Wo  Contrast    Standing Status: Future     Number of Occurrences:      Standing Expiration Date: 03/30/2014    Order Specific Question:  Reason for Exam (SYMPTOM  OR DIAGNOSIS REQUIRED)    Answer:  40-50 pack year smoker for LD CT chest    Order Specific Question:  Preferred imaging location?    Answer:  [facility]  . CMP14+EGFR  . NMR, lipoprofile  . Thyroid Panel With TSH    Meds ordered this encounter  Medications  . simvastatin (ZOCOR) 20 MG tablet    Sig: Take 1 tablet (20 mg total) by mouth at bedtime.    Dispense:  30 tablet    Refill:  11    Return in about 3 months (around 04/29/2013) for Recheck medical problems.  Oralee Rapaport P. Modesto Charon, M.D.

## 2013-01-28 NOTE — Patient Instructions (Addendum)
Smoking Cessation Quitting smoking is important to your health and has many advantages. However, it is not always easy to quit since nicotine is a very addictive drug. Often times, people try 3 times or more before being able to quit. This document explains the best ways for you to prepare to quit smoking. Quitting takes hard work and a lot of effort, but you can do it. ADVANTAGES OF QUITTING SMOKING  You will live longer, feel better, and live better.  Your body will feel the impact of quitting smoking almost immediately.  Within 20 minutes, blood pressure decreases. Your pulse returns to its normal level.  After 8 hours, carbon monoxide levels in the blood return to normal. Your oxygen level increases.  After 24 hours, the chance of having a heart attack starts to decrease. Your breath, hair, and body stop smelling like smoke.  After 48 hours, damaged nerve endings begin to recover. Your sense of taste and smell improve.  After 72 hours, the body is virtually free of nicotine. Your bronchial tubes relax and breathing becomes easier.  After 2 to 12 weeks, lungs can hold more air. Exercise becomes easier and circulation improves.  The risk of having a heart attack, stroke, cancer, or lung disease is greatly reduced.  After 1 year, the risk of coronary heart disease is cut in half.  After 5 years, the risk of stroke falls to the same as a nonsmoker.  After 10 years, the risk of lung cancer is cut in half and the risk of other cancers decreases significantly.  After 15 years, the risk of coronary heart disease drops, usually to the level of a nonsmoker.  If you are pregnant, quitting smoking will improve your chances of having a healthy baby.  The people you live with, especially any children, will be healthier.  You will have extra money to spend on things other than cigarettes. QUESTIONS TO THINK ABOUT BEFORE ATTEMPTING TO QUIT You may want to talk about your answers with your  caregiver.  Why do you want to quit?  If you tried to quit in the past, what helped and what did not?  What will be the most difficult situations for you after you quit? How will you plan to handle them?  Who can help you through the tough times? Your family? Friends? A caregiver?  What pleasures do you get from smoking? What ways can you still get pleasure if you quit? Here are some questions to ask your caregiver:  How can you help me to be successful at quitting?  What medicine do you think would be best for me and how should I take it?  What should I do if I need more help?  What is smoking withdrawal like? How can I get information on withdrawal? GET READY  Set a quit date.  Change your environment by getting rid of all cigarettes, ashtrays, matches, and lighters in your home, car, or work. Do not let people smoke in your home.  Review your past attempts to quit. Think about what worked and what did not. GET SUPPORT AND ENCOURAGEMENT You have a better chance of being successful if you have help. You can get support in many ways.  Tell your family, friends, and co-workers that you are going to quit and need their support. Ask them not to smoke around you.  Get individual, group, or telephone counseling and support. Programs are available at local hospitals and health centers. Call your local health department for   information about programs in your area.  Spiritual beliefs and practices may help some smokers quit.  Download a "quit meter" on your computer to keep track of quit statistics, such as how long you have gone without smoking, cigarettes not smoked, and money saved.  Get a self-help book about quitting smoking and staying off of tobacco. LEARN NEW SKILLS AND BEHAVIORS  Distract yourself from urges to smoke. Talk to someone, go for a walk, or occupy your time with a task.  Change your normal routine. Take a different route to work. Drink tea instead of coffee.  Eat breakfast in a different place.  Reduce your stress. Take a hot bath, exercise, or read a book.  Plan something enjoyable to do every day. Reward yourself for not smoking.  Explore interactive web-based programs that specialize in helping you quit. GET MEDICINE AND USE IT CORRECTLY Medicines can help you stop smoking and decrease the urge to smoke. Combining medicine with the above behavioral methods and support can greatly increase your chances of successfully quitting smoking.  Nicotine replacement therapy helps deliver nicotine to your body without the negative effects and risks of smoking. Nicotine replacement therapy includes nicotine gum, lozenges, inhalers, nasal sprays, and skin patches. Some may be available over-the-counter and others require a prescription.  Antidepressant medicine helps people abstain from smoking, but how this works is unknown. This medicine is available by prescription.  Nicotinic receptor partial agonist medicine simulates the effect of nicotine in your brain. This medicine is available by prescription. Ask your caregiver for advice about which medicines to use and how to use them based on your health history. Your caregiver will tell you what side effects to look out for if you choose to be on a medicine or therapy. Carefully read the information on the package. Do not use any other product containing nicotine while using a nicotine replacement product.  RELAPSE OR DIFFICULT SITUATIONS Most relapses occur within the first 3 months after quitting. Do not be discouraged if you start smoking again. Remember, most people try several times before finally quitting. You may have symptoms of withdrawal because your body is used to nicotine. You may crave cigarettes, be irritable, feel very hungry, cough often, get headaches, or have difficulty concentrating. The withdrawal symptoms are only temporary. They are strongest when you first quit, but they will go away within  10 14 days. To reduce the chances of relapse, try to:  Avoid drinking alcohol. Drinking lowers your chances of successfully quitting.  Reduce the amount of caffeine you consume. Once you quit smoking, the amount of caffeine in your body increases and can give you symptoms, such as a rapid heartbeat, sweating, and anxiety.  Avoid smokers because they can make you want to smoke.  Do not let weight gain distract you. Many smokers will gain weight when they quit, usually less than 10 pounds. Eat a healthy diet and stay active. You can always lose the weight gained after you quit.  Find ways to improve your mood other than smoking. FOR MORE INFORMATION  www.smokefree.gov  Document Released: 04/30/2001 Document Revised: 11/05/2011 Document Reviewed: 08/15/2011 [employer] Patient Information 2014 [employer].        Dr Buck Mcaffee's Recommendations  For nutrition information, I recommend books:  1).Eat to Live by Dr Joel Fuhrman. 2).Prevent and Reverse Heart Disease by Dr Caldwell Esselstyn. 3) Dr Neal Barnard's Book:  Program to Reverse Diabetes  Exercise recommendations are:  If unable to walk, then the patient can   exercise in a chair 3 times a day. By flapping arms like a bird gently and raising legs outwards to the front.  If ambulatory, the patient can go for walks for 30 minutes 3 times a week. Then increase the intensity and duration as tolerated.  Goal is to try to attain exercise frequency to 5 times a week.  If applicable: Best to perform resistance exercises (machines or weights) 2 days a week and cardio type exercises 3 days per week.   Stress Management Stress is a state of physical or mental tension that often results from changes in your life or normal routine. Some common causes of stress are:  Death of a loved one.  Injuries or severe illnesses.  Getting fired or changing jobs.  Moving into a new home. Other causes may be:  Sexual problems.  Business or  financial losses.  Taking on a large debt.  Regular conflict with someone at home or at work.  Constant tiredness from lack of sleep. It is not just bad things that are stressful. It may be stressful to:  Win the lottery.  Get married.  Buy a new car. The amount of stress that can be easily tolerated varies from person to person. Changes generally cause stress, regardless of the types of change. Too much stress can affect your health. It may lead to physical or emotional problems. Too little stress (boredom) may also become stressful. SUGGESTIONS TO REDUCE STRESS:  Talk things over with your family and friends. It often is helpful to share your concerns and worries. If you feel your problem is serious, you may want to get help from a professional counselor.  Consider your problems one at a time instead of lumping them all together. Trying to take care of everything at once may seem impossible. List all the things you need to do and then start with the most important one. Set a goal to accomplish 2 or 3 things each day. If you expect to do too many in a single day you will naturally fail, causing you to feel even more stressed.  Do not use alcohol or drugs to relieve stress. Although you may feel better for a short time, they do not remove the problems that caused the stress. They can also be habit forming.  Exercise regularly - at least 3 times per week. Physical exercise can help to relieve that "uptight" feeling and will relax you.  The shortest distance between despair and hope is often a good night's sleep.  Go to bed and get up on time allowing yourself time for appointments without being rushed.  Take a short "time-out" period from any stressful situation that occurs during the day. Close your eyes and take some deep breaths. Starting with the muscles in your face, tense them, hold it for a few seconds, then relax. Repeat this with the muscles in your neck, shoulders, hand,  stomach, back and legs.  Take good care of yourself. Eat a balanced diet and get plenty of rest.  Schedule time for having fun. Take a break from your daily routine to relax. HOME CARE INSTRUCTIONS   Call if you feel overwhelmed by your problems and feel you can no longer manage them on your own.  Return immediately if you feel like hurting yourself or someone else. Document Released: 10/30/2000 Document Revised: 07/29/2011 Document Reviewed: 06/22/2007 [facility] Patient Information 2014 [place], [place].

## 2013-01-30 LAB — CMP14+EGFR
ALT: 11 IU/L (ref 0–32)
AST: 16 IU/L (ref 0–40)
Albumin/Globulin Ratio: 2 (ref 1.1–2.5)
Albumin: 4.2 g/dL (ref 3.6–4.8)
Alkaline Phosphatase: 70 IU/L (ref 39–117)
BUN/Creatinine Ratio: 14 (ref 11–26)
BUN: 9 mg/dL (ref 8–27)
CO2: 25 mmol/L (ref 18–29)
Calcium: 9.2 mg/dL (ref 8.6–10.2)
Chloride: 98 mmol/L (ref 97–108)
Creatinine, Ser: 0.63 mg/dL (ref 0.57–1.00)
GFR calc Af Amer: 111 mL/min/{1.73_m2} (ref >59)
GFR calc non Af Amer: 96 mL/min/{1.73_m2} (ref >59)
Globulin, Total: 2.1 g/dL (ref 1.5–4.5)
Glucose: 98 mg/dL (ref 65–99)
Potassium: 4.7 mmol/L (ref 3.5–5.2)
Sodium: 137 mmol/L (ref 134–144)
Total Bilirubin: 0.3 mg/dL (ref 0.0–1.2)
Total Protein: 6.3 g/dL (ref 6.0–8.5)

## 2013-01-30 LAB — NMR, LIPOPROFILE
Cholesterol: 180 mg/dL (ref <200)
HDL Cholesterol by NMR: 61 mg/dL (ref >=40)
HDL Particle Number: 42.7 umol/L (ref >=30.5)
LDL Particle Number: 1511 nmol/L — ABNORMAL HIGH (ref <1000)
LDL Size: 21 nm (ref >20.5)
LDLC SERPL CALC-MCNC: 105 mg/dL — ABNORMAL HIGH (ref <100)
LP-IR Score: 29 (ref <=45)
Small LDL Particle Number: 535 nmol/L — ABNORMAL HIGH (ref <=527)
Triglycerides by NMR: 68 mg/dL (ref <150)

## 2013-01-30 LAB — THYROID PANEL WITH TSH
Free Thyroxine Index: 2.1 (ref 1.2–4.9)
T3 Uptake Ratio: 27 % (ref 24–39)
T4, Total: 7.8 ug/dL (ref 4.5–12.0)
TSH: 1.41 u[IU]/mL (ref 0.450–4.500)

## 2013-02-08 ENCOUNTER — Other Ambulatory Visit (HOSPITAL_COMMUNITY): Payer: Self-pay

## 2013-02-09 ENCOUNTER — Ambulatory Visit (HOSPITAL_COMMUNITY)
Admission: RE | Admit: 2013-02-09 | Discharge: 2013-02-09 | Disposition: A | Discharge status: Home / Self Care | Payer: 59 | Source: Ambulatory Visit | Attending: Family Medicine | Admitting: Family Medicine

## 2013-02-09 DIAGNOSIS — Z720 Tobacco use: Secondary | ICD-10-CM

## 2013-02-09 DIAGNOSIS — F17200 Nicotine dependence, unspecified, uncomplicated: Secondary | ICD-10-CM | POA: Insufficient documentation

## 2013-02-09 DIAGNOSIS — Z1389 Encounter for screening for other disorder: Secondary | ICD-10-CM | POA: Insufficient documentation

## 2013-02-10 ENCOUNTER — Other Ambulatory Visit: Payer: Self-pay | Admitting: Family Medicine

## 2013-02-10 DIAGNOSIS — Z720 Tobacco use: Secondary | ICD-10-CM

## 2013-02-19 ENCOUNTER — Other Ambulatory Visit: Payer: Self-pay

## 2013-02-19 DIAGNOSIS — F17200 Nicotine dependence, unspecified, uncomplicated: Secondary | ICD-10-CM

## 2013-02-25 ENCOUNTER — Ambulatory Visit (HOSPITAL_COMMUNITY): Payer: 59

## 2013-04-06 ENCOUNTER — Other Ambulatory Visit: Payer: Self-pay | Admitting: Nurse Practitioner

## 2013-04-08 ENCOUNTER — Other Ambulatory Visit: Payer: Self-pay | Admitting: Family Medicine

## 2013-06-02 ENCOUNTER — Other Ambulatory Visit: Payer: Self-pay | Admitting: Family Medicine

## 2013-06-19 ENCOUNTER — Encounter: Payer: Self-pay | Admitting: Nurse Practitioner

## 2013-06-19 ENCOUNTER — Ambulatory Visit (INDEPENDENT_AMBULATORY_CARE_PROVIDER_SITE_OTHER): Payer: 59 | Admitting: Nurse Practitioner

## 2013-06-19 VITALS — BP 105/60 | HR 80 | Temp 98.1°F | Ht 63.0 in | Wt 174.0 lb

## 2013-06-19 DIAGNOSIS — K5732 Diverticulitis of large intestine without perforation or abscess without bleeding: Secondary | ICD-10-CM

## 2013-06-19 MED ORDER — CIPROFLOXACIN HCL 500 MG PO TABS: 500.0000 mg | ORAL_TABLET | Freq: Two times a day (BID) | ORAL | Status: DC

## 2013-06-19 MED ORDER — METRONIDAZOLE 500 MG PO TABS: 500.0000 mg | ORAL_TABLET | Freq: Three times a day (TID) | ORAL | Status: DC

## 2013-06-19 NOTE — Patient Instructions (Signed)
Diverticulitis °A diverticulum is a small pouch or sac on the colon. Diverticulosis is the presence of these diverticula on the colon. Diverticulitis is the irritation (inflammation) or infection of diverticula. °CAUSES  °The colon and its diverticula contain bacteria. If food particles block the tiny opening to a diverticulum, the bacteria inside can grow and cause an increase in pressure. This leads to infection and inflammation and is called diverticulitis. °SYMPTOMS  °· Abdominal pain and tenderness. Usually, the pain is located on the left side of your abdomen. However, it could be located elsewhere. °· Fever. °· Bloating. °· Feeling sick to your stomach (nausea). °· Throwing up (vomiting). °· Abnormal stools. °DIAGNOSIS  °Your caregiver will take a history and perform a physical exam. Since many things can cause abdominal pain, other tests may be necessary. Tests may include: °· Blood tests. °· Urine tests. °· X-ray of the abdomen. °· CT scan of the abdomen. °Sometimes, surgery is needed to determine if diverticulitis or other conditions are causing your symptoms. °TREATMENT  °Most of the time, you can be treated without surgery. Treatment includes: °· Resting the bowels by only having liquids for a few days. As you improve, you will need to eat a low-fiber diet. °· Intravenous (IV) fluids if you are losing body fluids (dehydrated). °· Antibiotic medicines that treat infections may be given. °· Pain and nausea medicine, if needed. °· Surgery if the inflamed diverticulum has burst. °HOME CARE INSTRUCTIONS  °· Try a clear liquid diet (broth, tea, or water for as long as directed by your caregiver). You may then gradually begin a low-fiber diet as tolerated.  °A low-fiber diet is a diet with less than 10 grams of fiber. Choose the foods below to reduce fiber in the diet: °· White breads, cereals, rice, and pasta. °· Cooked fruits and vegetables or soft fresh fruits and vegetables without the skin. °· Ground or  well-cooked tender beef, ham, veal, lamb, pork, or poultry. °· Eggs and seafood. °· After your diverticulitis symptoms have improved, your caregiver may put you on a high-fiber diet. A high-fiber diet includes 14 grams of fiber for every 1000 calories consumed. For a standard 2000 calorie diet, you would need 28 grams of fiber. Follow these diet guidelines to help you increase the fiber in your diet. It is important to slowly increase the amount fiber in your diet to avoid gas, constipation, and bloating. °· Choose whole-grain breads, cereals, pasta, and brown rice. °· Choose fresh fruits and vegetables with the skin on. Do not overcook vegetables because the more vegetables are cooked, the more fiber is lost. °· Choose more nuts, seeds, legumes, dried peas, beans, and lentils. °· Look for food products that have greater than 3 grams of fiber per serving on the Nutrition Facts label. °· Take all medicine as directed by your caregiver. °· If your caregiver has given you a follow-up appointment, it is very important that you go. Not going could result in lasting (chronic) or permanent injury, pain, and disability. If there is any problem keeping the appointment, call to reschedule. °SEEK MEDICAL CARE IF:  °· Your pain does not improve. °· You have a hard time advancing your diet beyond clear liquids. °· Your bowel movements do not return to normal. °SEEK IMMEDIATE MEDICAL CARE IF:  °· Your pain becomes worse. °· You have an oral temperature above 102° F (38.9° C), not controlled by medicine. °· You have repeated vomiting. °· You have bloody or black, tarry stools. °·   Symptoms that brought you to your caregiver become worse or are not getting better. °MAKE SURE YOU:  °· Understand these instructions. °· Will watch your condition. °· Will get help right away if you are not doing well or get worse. °Document Released: 02/13/2005 Document Revised: 07/29/2011 Document Reviewed: 06/11/2010 °ExitCare® Patient Information  ©2014 [employer]. ° °

## 2013-06-19 NOTE — Progress Notes (Signed)
   Subjective:    Patient ID: Barbara Boyd, female    DOB: [birth year], 63 y.o.   MRN: [id number]  HPI Patient in c/o abdominal pain- had diarrhea that started late Sunday night- the diarrhea resolved by Wednesday bt patient has been having severs abdominal pain , feels like a spasm- lasts for several minutes and gradually eases up. Has had diverticulitis in the past but says that was years ago and she doesn't remember how she felt.    Review of Systems  Constitutional: Negative.   HENT: Negative.   Respiratory: Negative.   Gastrointestinal: Positive for abdominal pain.  Genitourinary: Negative.   All other systems reviewed and are negative.       Objective:   Physical Exam  Constitutional: She is oriented to person, place, and time. She appears well-developed and well-nourished.  Cardiovascular: Normal rate, regular rhythm and normal heart sounds.   Pulmonary/Chest: Effort normal and breath sounds normal.  Abdominal: Soft. Bowel sounds are normal. Tenderness: left upper and lower quadrant.   Neurological: She is alert and oriented to person, place, and time.  Skin: Skin is warm and dry.  Psychiatric: She has a normal mood and affect. Her behavior is normal. Judgment and thought content normal.    BP 105/60  Pulse 80  Temp(Src) 98.1 F (36.7 C) (Oral)  Ht 5\' 3"  (1.6 m)  Wt 174 lb (78.926 kg)  BMI 30.83 kg/m2       Assessment & Plan:  Diverticulitis of colon (without mention of hemorrhage) - Plan: ciprofloxacin (CIPRO) 500 MG tablet, metroNIDAZOLE (FLAGYL) 500 MG tablet watch diet- nothing with small seeds Force fluids rto prn  [name], FNP

## 2013-08-04 ENCOUNTER — Other Ambulatory Visit: Payer: Self-pay | Admitting: Family Medicine

## 2013-08-05 ENCOUNTER — Other Ambulatory Visit: Payer: Self-pay | Admitting: Family Medicine

## 2013-09-17 DIAGNOSIS — C801 Malignant (primary) neoplasm, unspecified: Secondary | ICD-10-CM

## 2013-09-17 HISTORY — DX: Malignant (primary) neoplasm, unspecified: C80.1

## 2013-09-24 DIAGNOSIS — C50412 Malignant neoplasm of upper-outer quadrant of left female breast: Secondary | ICD-10-CM | POA: Insufficient documentation

## 2013-09-30 ENCOUNTER — Other Ambulatory Visit: Payer: Self-pay

## 2013-09-30 MED ORDER — ESCITALOPRAM OXALATE 10 MG PO TABS: ORAL_TABLET | ORAL | Status: DC

## 2013-09-30 NOTE — Telephone Encounter (Signed)
Last seen 06/19/13 MMM 

## 2013-10-28 ENCOUNTER — Other Ambulatory Visit: Payer: Self-pay | Admitting: Nurse Practitioner

## 2013-12-16 ENCOUNTER — Encounter: Payer: Self-pay | Admitting: Gastroenterology

## 2014-01-13 ENCOUNTER — Other Ambulatory Visit: Payer: Self-pay | Admitting: Nurse Practitioner

## 2014-01-14 NOTE — Telephone Encounter (Signed)
Last seen 06/19/13 MMM

## 2014-01-17 NOTE — Telephone Encounter (Signed)
Patient NTBS for follow up and lab work  

## 2014-01-31 ENCOUNTER — Encounter: Payer: Self-pay | Admitting: Nurse Practitioner

## 2014-01-31 ENCOUNTER — Ambulatory Visit (INDEPENDENT_AMBULATORY_CARE_PROVIDER_SITE_OTHER): Payer: 59 | Admitting: Nurse Practitioner

## 2014-01-31 VITALS — BP 135/86 | HR 80 | Temp 98.1°F | Ht 63.0 in | Wt 174.2 lb

## 2014-01-31 DIAGNOSIS — Z713 Dietary counseling and surveillance: Secondary | ICD-10-CM

## 2014-01-31 DIAGNOSIS — Z0000 Encounter for general adult medical examination without abnormal findings: Secondary | ICD-10-CM

## 2014-01-31 DIAGNOSIS — M1990 Unspecified osteoarthritis, unspecified site: Secondary | ICD-10-CM

## 2014-01-31 DIAGNOSIS — C50912 Malignant neoplasm of unspecified site of left female breast: Secondary | ICD-10-CM

## 2014-01-31 DIAGNOSIS — E785 Hyperlipidemia, unspecified: Secondary | ICD-10-CM

## 2014-01-31 DIAGNOSIS — Z13820 Encounter for screening for osteoporosis: Secondary | ICD-10-CM

## 2014-01-31 DIAGNOSIS — E0500 Thyrotoxicosis with diffuse goiter without thyrotoxic crisis or storm: Secondary | ICD-10-CM

## 2014-01-31 DIAGNOSIS — J441 Chronic obstructive pulmonary disease with (acute) exacerbation: Secondary | ICD-10-CM

## 2014-01-31 DIAGNOSIS — F329 Major depressive disorder, single episode, unspecified: Secondary | ICD-10-CM

## 2014-01-31 DIAGNOSIS — Z124 Encounter for screening for malignant neoplasm of cervix: Secondary | ICD-10-CM

## 2014-01-31 DIAGNOSIS — Z6825 Body mass index (BMI) 25.0-25.9, adult: Secondary | ICD-10-CM

## 2014-01-31 DIAGNOSIS — J410 Simple chronic bronchitis: Secondary | ICD-10-CM

## 2014-01-31 DIAGNOSIS — Z01419 Encounter for gynecological examination (general) (routine) without abnormal findings: Secondary | ICD-10-CM

## 2014-01-31 DIAGNOSIS — C50919 Malignant neoplasm of unspecified site of unspecified female breast: Secondary | ICD-10-CM | POA: Insufficient documentation

## 2014-01-31 DIAGNOSIS — F32A Depression, unspecified: Secondary | ICD-10-CM

## 2014-01-31 LAB — POCT CBC
GRANULOCYTE PERCENT: 56.4 % (ref 37–80)
HCT, POC: 42.3 % (ref 37.7–47.9)
Hemoglobin: 14.5 g/dL (ref 12.2–16.2)
LYMPH, POC: 2.1 (ref 0.6–3.4)
MCH, POC: 33.3 pg — AB (ref 27–31.2)
MCHC: 34.3 g/dL (ref 31.8–35.4)
MCV: 97.1 fL — AB (ref 80–97)
MPV: 7.3 fL (ref 0–99.8)
PLATELET COUNT, POC: 289 10*3/uL (ref 142–424)
POC GRANULOCYTE: 3.4 (ref 2–6.9)
POC LYMPH %: 35.2 % (ref 10–50)
RBC: 4.4 M/uL (ref 4.04–5.48)
RDW, POC: 13.4 %
WBC: 6 10*3/uL (ref 4.6–10.2)

## 2014-01-31 LAB — POCT URINALYSIS DIPSTICK
BILIRUBIN UA: NEGATIVE
GLUCOSE UA: NEGATIVE
Ketones, UA: NEGATIVE
Leukocytes, UA: NEGATIVE
NITRITE UA: NEGATIVE
Protein, UA: NEGATIVE
Spec Grav, UA: <=1.005
Urobilinogen, UA: NEGATIVE
pH, UA: 8

## 2014-01-31 LAB — POCT UA - MICROSCOPIC ONLY
Bacteria, U Microscopic: NEGATIVE
Casts, Ur, LPF, POC: NEGATIVE
Crystals, Ur, HPF, POC: NEGATIVE
Mucus, UA: NEGATIVE
WBC, UR, HPF, POC: NEGATIVE
Yeast, UA: NEGATIVE

## 2014-01-31 MED ORDER — METHIMAZOLE 5 MG PO TABS: ORAL_TABLET | ORAL | Status: DC

## 2014-01-31 MED ORDER — SIMVASTATIN 20 MG PO TABS: 20.0000 mg | ORAL_TABLET | Freq: Every day | ORAL | Status: DC

## 2014-01-31 MED ORDER — TIOTROPIUM BROMIDE MONOHYDRATE 18 MCG IN CAPS: 18.0000 ug | ORAL_CAPSULE | Freq: Every day | RESPIRATORY_TRACT | Status: DC

## 2014-01-31 MED ORDER — ESCITALOPRAM OXALATE 10 MG PO TABS: ORAL_TABLET | ORAL | Status: DC

## 2014-01-31 MED ORDER — FLUTICASONE-SALMETEROL 250-50 MCG/DOSE IN AEPB: 1.0000 | INHALATION_SPRAY | Freq: Two times a day (BID) | RESPIRATORY_TRACT | Status: DC

## 2014-01-31 NOTE — Patient Instructions (Signed)

## 2014-01-31 NOTE — Progress Notes (Signed)
Subjective:    Patient ID: Barbara Boyd, female    DOB: 31-Oct-1950, 63 y.o.   MRN: [id number]\  Patient here today for annnual physical exam and PAP- She is doing well without compalints.  Hyperlipidemia This is a chronic problem. The current episode started more than 1 year ago. The problem is controlled. Recent lipid tests were reviewed and are normal. Current antihyperlipidemic treatment includes statins. The current treatment provides moderate improvement of lipids. Compliance problems include adherence to diet and adherence to exercise.  Risk factors for coronary artery disease include obesity and stress.  Breast cancer/ bil masectomy Still oin pain meds from surgery but os doing well- had real implants put in 2 weeks ago. Graves disease Currently on methimazole- only sees [place] now for this. COPd advair bid and spirivia and is doing well- no need for albuterol in quite sometime- denies SOB and /or wheezing. Depression Lexapro- working well considering the stress she is under form cancer.   Review of Systems  Constitutional: Negative.   HENT: Negative.   Respiratory: Negative.   Cardiovascular: Negative.   Genitourinary: Negative.   Neurological: Negative.   Psychiatric/Behavioral: Negative.   All other systems reviewed and are negative.      Objective:   Physical Exam  Constitutional: She is oriented to person, place, and time. She appears well-developed and well-nourished.  HENT:  Head: Normocephalic.  Right Ear: Hearing, tympanic membrane, external ear and ear canal normal.  Left Ear: Hearing, tympanic membrane, external ear and ear canal normal.  Nose: Nose normal.  Mouth/Throat: Uvula is midline and oropharynx is clear and moist.  Eyes: Conjunctivae and EOM are normal. Pupils are equal, round, and reactive to light.  Neck: Normal range of motion and full passive range of motion without pain. Neck supple. No JVD present. Carotid bruit is not present. No mass and no  thyromegaly present.  Cardiovascular: Normal rate, normal heart sounds and intact distal pulses.   No murmur heard. Pulmonary/Chest: Effort normal and breath sounds normal.  Bilateral breast implants- no breast exam done today.  Abdominal: Soft. Bowel sounds are normal. She exhibits no mass. There is no tenderness.  Genitourinary: Vagina normal and uterus normal. No breast swelling, tenderness, discharge or bleeding.  bimanual exam-No adnexal masses or tenderness Cervix parous and pink no discharge.  Musculoskeletal: Normal range of motion.  Lymphadenopathy:    She has no cervical adenopathy.  Neurological: She is alert and oriented to person, place, and time.  Skin: Skin is warm and dry.  Psychiatric: She has a normal mood and affect. Her behavior is normal. Judgment and thought content normal.  BP 135/86  Pulse 80  Temp(Src) 98.1 F (36.7 C) (Oral)  Ht _0  (1.6 m)  Wt 174 lb 3.2 oz (79.017 kg)  BMI 30.87 kg/m2  Results for orders placed in visit on 01/31/14  POCT URINALYSIS DIPSTICK      Result Value Ref Range   Color, UA straw     Clarity, UA clear     Glucose, UA neg     Bilirubin, UA neg     Ketones, UA neg     Spec Grav, UA <=1.005     Blood, UA mod     pH, UA 8.0     Protein, UA neg     Urobilinogen, UA negative     Nitrite, UA neg     Leukocytes, UA Negative    POCT UA - MICROSCOPIC ONLY      Result  Value Ref Range   WBC, Ur, HPF, POC neg     RBC, urine, microscopic 5-10     Bacteria, U Microscopic neg     Mucus, UA neg     Epithelial cells, urine per micros occ     Crystals, Ur, HPF, POC neg     Casts, Ur, LPF, POC neg     Yeast, UA neg            Assessment & Plan:   1. Encounter for routine gynecological examination   2. Hyperlipidemia   3. Graves disease   4. Simple chronic bronchitis   5. Arthritis   6. Invasive ductal carcinoma of breast, stage 1, left   7. Annual physical exam   8. COPD exacerbation   9. HLD (hyperlipidemia)   10.  Depression   11. BMI 25.0-25.9,adult   12. Weight loss counseling, encounter for    Orders Placed This Encounter  Procedures  . CMP14+EGFR  . NMR, lipoprofile  . Thyroid Panel With TSH  . Ambulatory referral to Gastroenterology    Referral Priority:  Routine    Referral Type:  Consultation    Referral Reason:  Specialty Services Required    Requested Specialty:  Gastroenterology    Number of Visits Requested:  1  . POCT urinalysis dipstick  . POCT UA - Microscopic Only  . POCT CBC   Meds ordered this encounter  Medications  . diazepam (VALIUM) 5 MG tablet    Sig: Take 5 mg by mouth.  Marland Kitchen HYDROcodone-acetaminophen (NORCO/VICODIN) 5-325 MG per tablet    Sig: Take 1 tablet by mouth.  . tiotropium (SPIRIVA HANDIHALER) 18 MCG inhalation capsule    Sig: Place 1 capsule (18 mcg total) into inhaler and inhale daily.    Dispense:  30 capsule    Refill:  5    Order Specific Question:  Supervising Provider    Answer:  Chipper Herb [[id number]]  . Fluticasone-Salmeterol (ADVAIR) 250-50 MCG/DOSE AEPB    Sig: Inhale 1 puff into the lungs every 12 (twelve) hours.    Dispense:  60 each    Refill:  5    Order Specific Question:  Supervising Provider    Answer:  Chipper Herb [[id number]]  . simvastatin (ZOCOR) 20 MG tablet    Sig: Take 1 tablet (20 mg total) by mouth at bedtime.    Dispense:  30 tablet    Refill:  5    Order Specific Question:  Supervising Provider    Answer:  Chipper Herb [[id number]]  . methimazole (TAPAZOLE) 5 MG tablet    Sig: TAKE 1/2 TABLET BY MOUTH DAILY    Dispense:  30 tablet    Refill:  5    Order Specific Question:  Supervising Provider    Answer:  Chipper Herb [[id number]]  . escitalopram (LEXAPRO) 10 MG tablet    Sig: TAKE 1 TABLET (10 MG TOTAL) BY MOUTH DAILY.    Dispense:  30 tablet    Refill:  5    Order Specific Question:  Supervising Provider    Answer:  Chipper Herb [[id number]]   Referral made to GI for colonoscopy No zostavax today- due to recovering fro  breast cancer treatment Discussed weight management for patient with BMI> 25 Labs pending Health maintenance reviewed Diet and exercise encouraged Continue all meds Follow up  In 6 months   [place], FNP

## 2014-02-01 LAB — NMR, LIPOPROFILE
Cholesterol: 191 mg/dL (ref 100–199)
HDL Cholesterol by NMR: 61 mg/dL (ref >39)
HDL PARTICLE NUMBER: 39.8 umol/L (ref >=30.5)
LDL PARTICLE NUMBER: 1488 nmol/L — AB (ref <1000)
LDL Size: 21 nm (ref >20.5)
LDLC SERPL CALC-MCNC: 114 mg/dL — ABNORMAL HIGH (ref 0–99)
LP-IR SCORE: 27 (ref <=45)
SMALL LDL PARTICLE NUMBER: 749 nmol/L — AB (ref <=527)
TRIGLYCERIDES BY NMR: 81 mg/dL (ref 0–149)

## 2014-02-01 LAB — CMP14+EGFR
A/G RATIO: 1.8 (ref 1.1–2.5)
ALBUMIN: 4.1 g/dL (ref 3.6–4.8)
ALK PHOS: 77 IU/L (ref 39–117)
ALT: 18 IU/L (ref 0–32)
AST: 20 IU/L (ref 0–40)
BILIRUBIN TOTAL: 0.4 mg/dL (ref 0.0–1.2)
BUN / CREAT RATIO: 12 (ref 11–26)
BUN: 8 mg/dL (ref 8–27)
CHLORIDE: 96 mmol/L — AB (ref 97–108)
CO2: 24 mmol/L (ref 18–29)
Calcium: 9.3 mg/dL (ref 8.7–10.3)
Creatinine, Ser: 0.68 mg/dL (ref 0.57–1.00)
GFR, EST AFRICAN AMERICAN: 108 mL/min/{1.73_m2} (ref >59)
GFR, EST NON AFRICAN AMERICAN: 93 mL/min/{1.73_m2} (ref >59)
GLOBULIN, TOTAL: 2.3 g/dL (ref 1.5–4.5)
Glucose: 87 mg/dL (ref 65–99)
Potassium: 4.3 mmol/L (ref 3.5–5.2)
Sodium: 139 mmol/L (ref 134–144)
TOTAL PROTEIN: 6.4 g/dL (ref 6.0–8.5)

## 2014-02-01 LAB — PAP IG W/ RFLX HPV ASCU: PAP SMEAR COMMENT: 0

## 2014-02-01 LAB — THYROID PANEL WITH TSH
Free Thyroxine Index: 2.2 (ref 1.2–4.9)
T3 Uptake Ratio: 27 % (ref 24–39)
T4, Total: 8 ug/dL (ref 4.5–12.0)
TSH: 1.89 u[IU]/mL (ref 0.450–4.500)

## 2014-02-02 ENCOUNTER — Other Ambulatory Visit: Payer: Self-pay | Admitting: Nurse Practitioner

## 2014-02-02 MED ORDER — SIMVASTATIN 40 MG PO TABS: 40.0000 mg | ORAL_TABLET | Freq: Every day | ORAL | Status: DC

## 2014-02-11 ENCOUNTER — Encounter: Payer: Self-pay | Admitting: Gastroenterology

## 2014-02-13 ENCOUNTER — Other Ambulatory Visit: Payer: Self-pay | Admitting: Nurse Practitioner

## 2014-04-18 ENCOUNTER — Ambulatory Visit (AMBULATORY_SURGERY_CENTER): Payer: Self-pay | Admitting: *Deleted

## 2014-04-18 VITALS — Ht 62.0 in | Wt 170.6 lb

## 2014-04-18 DIAGNOSIS — Z1211 Encounter for screening for malignant neoplasm of colon: Secondary | ICD-10-CM

## 2014-04-18 MED ORDER — NA SULFATE-K SULFATE-MG SULF 17.5-3.13-1.6 GM/177ML PO SOLN: 1.0000 | Freq: Once | ORAL | Status: DC

## 2014-04-18 NOTE — Progress Notes (Signed)
No egg or soy sllergy. ewm No issues with past sedation. ewm No diet pills. ewm No home o2 use. ewm

## 2014-04-26 ENCOUNTER — Encounter: Payer: Self-pay | Admitting: Gastroenterology

## 2014-04-27 ENCOUNTER — Encounter: Payer: Self-pay | Admitting: Pharmacist

## 2014-04-27 ENCOUNTER — Ambulatory Visit (INDEPENDENT_AMBULATORY_CARE_PROVIDER_SITE_OTHER): Payer: 59 | Admitting: Pharmacist

## 2014-04-27 ENCOUNTER — Ambulatory Visit (INDEPENDENT_AMBULATORY_CARE_PROVIDER_SITE_OTHER): Payer: 59

## 2014-04-27 VITALS — Ht 62.0 in | Wt 170.0 lb

## 2014-04-27 DIAGNOSIS — M8580 Other specified disorders of bone density and structure, unspecified site: Secondary | ICD-10-CM

## 2014-04-27 DIAGNOSIS — E559 Vitamin D deficiency, unspecified: Secondary | ICD-10-CM

## 2014-04-27 DIAGNOSIS — Z13820 Encounter for screening for osteoporosis: Secondary | ICD-10-CM

## 2014-04-27 LAB — HM DEXA SCAN

## 2014-04-27 NOTE — Patient Instructions (Signed)

## 2014-04-27 NOTE — Progress Notes (Signed)
Patient ID: [name], female   DOB: 05-15-1951, 63 y.o.   MRN: [id number]  [facility] Current Height: Height: 5\' 2"  (157.5 cm)      Max Lifetime Height:  5' 2.5" Current Weight: Weight: 170 lb (77.111 kg)       Ethnicity:Caucasian    HPI: Does pt already have a diagnosis of:  Osteopenia?  Yes Osteoporosis?  No  Back Pain?  Yes       Kyphosis?  No Prior fracture?  No Med(s) for Osteoporosis/Osteopenia:  none Med(s) previously tried for Osteoporosis/Osteopenia:  Fosamax and boniva tried in past - patient stopped on own due to concerns about side effects                                                             PMH: Age at menopause:  63yo Hysterectomy?  No Oophorectomy?  No HRT? No Steroid Use?  No Thyroid med?  Yes History of cancer?  Yes - breast cancer - double masectomy early 2015 - no radiation or chemo History of digestive disorders (ie Crohn's)?  No Current or previous eating disorders?  No Last Vitamin D Result:  24 (2011) Last GFR Result:  93 (01/31/2014)   FH/SH: Family history of osteoporosis?  Yes  - mother and maternal grandmother Parent with history of hip fracture?  No Family history of breast cancer?  Yes - 2 maternal aunts and 1 paternal aunts Exercise?  No Smoking?  Yes   Alcohol?  No    Calcium Assessment Calcium Intake  # of servings/day  Calcium mg  Milk (8 oz) 0  x  300  = 0  Yogurt (4 oz) 0.5 x  200 = 100mg   Cheese (1 oz) 1 x  200 = 200mg   Other Calcium sources   250mg   Ca supplement 0 = 0   Estimated calcium intake per day 550mg     DEXA Results Date of Test T-Score for AP Spine L1-L4 T-Score for Total Left Hip T-Score for Total Right Hip  04/27/2014 -1.3 -1.5 -1.5  10/05/2008 -1.2 -1.2 -1.5  09/06/2002 -1.3 -1.6 --  07/15/2000 -1.2 -1.2 --   FRAX 10 year estimate: Total FX risk:  9.1%  (consider medication if >/= 20%) Hip FX risk:  1.7%  (consider medication if >/= 3%)  Assessment: Osteopenia Low serum vitamin  D  Recommendations: 1.  Discussed fracture risk and BMD results 2.  recommend calcium 1200mg  daily through supplementation or diet.  3.  recommend weight bearing exercise - 30 minutes at least 4 days per week.   4.  Counseled and educated about fall risk and prevention. 5.  Discussed benefits of smoking ceaastion on bone and overall health patient declined at this time 6.  Checking vitamin D level today - pending  Recheck DEXA:  2 years  Time spent counseling patient:  30 minutes   Cherre Robins, PharmD, CPP

## 2014-04-28 LAB — VITAMIN D 25 HYDROXY (VIT D DEFICIENCY, FRACTURES): Vit D, 25-Hydroxy: 16.7 ng/mL — ABNORMAL LOW (ref 30.0–100.0)

## 2014-04-29 ENCOUNTER — Telehealth: Payer: Self-pay | Admitting: Pharmacist

## 2014-04-29 ENCOUNTER — Other Ambulatory Visit: Payer: Self-pay | Admitting: Pharmacist

## 2014-04-29 ENCOUNTER — Ambulatory Visit (AMBULATORY_SURGERY_CENTER): Payer: 59 | Admitting: Gastroenterology

## 2014-04-29 ENCOUNTER — Encounter: Payer: Self-pay | Admitting: Gastroenterology

## 2014-04-29 VITALS — BP 106/61 | HR 63 | Temp 97.6°F | Resp 20 | Ht 62.0 in | Wt 171.0 lb

## 2014-04-29 DIAGNOSIS — R7989 Other specified abnormal findings of blood chemistry: Secondary | ICD-10-CM

## 2014-04-29 DIAGNOSIS — Z1211 Encounter for screening for malignant neoplasm of colon: Secondary | ICD-10-CM

## 2014-04-29 DIAGNOSIS — D120 Benign neoplasm of cecum: Secondary | ICD-10-CM

## 2014-04-29 DIAGNOSIS — K5730 Diverticulosis of large intestine without perforation or abscess without bleeding: Secondary | ICD-10-CM

## 2014-04-29 MED ORDER — SODIUM CHLORIDE 0.9 % IV SOLN: 500.0000 mL | INTRAVENOUS | Status: DC

## 2014-04-29 MED ORDER — VITAMIN D (ERGOCALCIFEROL) 1.25 MG (50000 UNIT) PO CAPS
50000.0000 [IU] | ORAL_CAPSULE | ORAL | Status: DC
Start: 2014-04-29 — End: 2014-07-17

## 2014-04-29 NOTE — Op Note (Signed)
[facility]  [address] [place], [address]   COLONOSCOPY PROCEDURE REPORT  PATIENT: Barbara, Boyd  MR#: [id number] BIRTHDATE: 10/13/1950 , 63  yrs. old GENDER: female ENDOSCOPIST: Inda Castle, MD REFERRED XK:GYJEHU Laurance Flatten, M.D. PROCEDURE DATE:  04/29/2014 PROCEDURE:   Colonoscopy with snare polypectomy and Colonoscopy with cold biopsy polypectomy First Screening Colonoscopy - Avg.  risk and is 50 yrs.  old or older - No.  Prior Negative Screening - Now for repeat screening. 10 or more years since last screening  History of Adenoma - Now for follow-up colonoscopy & has been > or = to 3 yrs.  N/A  Polyps Removed Today? Yes. ASA CLASS:   Class II INDICATIONS:average risk for colon cancer. MEDICATIONS: Monitored anesthesia care and Propofol 160 mg IV  DESCRIPTION OF PROCEDURE:   After the risks benefits and alternatives of the procedure were thoroughly explained, informed consent was obtained.  The digital rectal exam revealed no abnormalities of the rectum.   The [id number]  endoscope was introduced through the anus and advanced to the cecum, which was identified by both the appendix and ileocecal valve. No adverse events experienced.   The quality of the prep was excellent using Suprep  The instrument was then slowly withdrawn as the colon was fully examined.      COLON FINDINGS: A flat polyp measuring 4 mm in size was found at the cecum.  A polypectomy was performed using snare cautery.   There was mild diverticulosis noted in the transverse colon, descending colon, and sigmoid colon.   The examination was otherwise normal. Retroflexed views revealed no abnormalities. The time to cecum=2 minutes 12 seconds.  Withdrawal time=11 minutes 52 seconds.  The scope was withdrawn and the procedure completed. COMPLICATIONS: There were no immediate complications.  ENDOSCOPIC IMPRESSION: 1.   Flat polyp was found at the cecum; polypectomy was  performed using snare cautery and cold biopsy forceps 2.   There was mild diverticulosis noted in the transverse colon, descending colon, and sigmoid colon 3.   The examination was otherwise normal  RECOMMENDATIONS: If the polyp(s) removed today are proven to be adenomatous (pre-cancerous) polyps, you will need a repeat colonoscopy in 5 years.  Otherwise you should continue to follow colorectal cancer screening guidelines for "routine risk" patients with colonoscopy in 10 years.  You will receive a letter within 1-2 weeks with the results of your biopsy as well as final recommendations.  Please call my office if you have not received a letter after 3 weeks.  eSigned:  Inda Castle, MD 04/29/2014 9:40 AM   cc:   PATIENT NAME:  Barbara, Boyd MR#: [id number]

## 2014-04-29 NOTE — Telephone Encounter (Signed)
Patient notified of low vitamin D results - start vitamin D 50000IU weekly - Rx sent to pharmacy

## 2014-04-29 NOTE — Patient Instructions (Signed)

## 2014-04-29 NOTE — Progress Notes (Signed)
Called to room to assist during endoscopic procedure.  Patient ID and intended procedure confirmed with present staff. Received instructions for my participation in the procedure from the performing physician.  

## 2014-04-29 NOTE — Progress Notes (Signed)
Procedure ends, to recovery, report given and VSS. 

## 2014-04-30 ENCOUNTER — Other Ambulatory Visit: Payer: Self-pay | Admitting: Nurse Practitioner

## 2014-05-02 ENCOUNTER — Telehealth: Payer: Self-pay

## 2014-05-02 NOTE — Telephone Encounter (Signed)
  Follow up Call-  Call back number 04/29/2014  Post procedure Call Back phone  # [phone]  Permission to leave phone message Yes     Patient questions:  Do you have a fever, pain , or abdominal swelling? No. Pain Score  0 *  Have you tolerated food without any problems? Yes.    Have you been able to return to your normal activities? Yes.    Do you have any questions about your discharge instructions: Diet   No. Medications  No. Follow up visit  No.  Do you have questions or concerns about your Care? No.  Actions: * If pain score is 4 or above: No action needed, pain <4.

## 2014-05-03 ENCOUNTER — Encounter: Payer: Self-pay | Admitting: Gastroenterology

## 2014-07-17 ENCOUNTER — Other Ambulatory Visit: Payer: Self-pay | Admitting: Pharmacist

## 2014-07-18 NOTE — Telephone Encounter (Signed)
Last seen 01/31/14  MMM  Last Vit D 04/27/14  16.7 low

## 2014-07-20 ENCOUNTER — Other Ambulatory Visit: Payer: Self-pay | Admitting: Pharmacist

## 2014-08-01 ENCOUNTER — Ambulatory Visit (INDEPENDENT_AMBULATORY_CARE_PROVIDER_SITE_OTHER): Payer: 59 | Admitting: Nurse Practitioner

## 2014-08-01 ENCOUNTER — Encounter: Payer: Self-pay | Admitting: Nurse Practitioner

## 2014-08-01 VITALS — BP 109/64 | HR 81 | Temp 97.4°F | Ht 62.0 in | Wt 170.0 lb

## 2014-08-01 DIAGNOSIS — C50912 Malignant neoplasm of unspecified site of left female breast: Secondary | ICD-10-CM | POA: Diagnosis not present

## 2014-08-01 DIAGNOSIS — E785 Hyperlipidemia, unspecified: Secondary | ICD-10-CM

## 2014-08-01 DIAGNOSIS — E0500 Thyrotoxicosis with diffuse goiter without thyrotoxic crisis or storm: Secondary | ICD-10-CM | POA: Diagnosis not present

## 2014-08-01 DIAGNOSIS — F329 Major depressive disorder, single episode, unspecified: Secondary | ICD-10-CM

## 2014-08-01 DIAGNOSIS — J441 Chronic obstructive pulmonary disease with (acute) exacerbation: Secondary | ICD-10-CM | POA: Diagnosis not present

## 2014-08-01 DIAGNOSIS — F32A Depression, unspecified: Secondary | ICD-10-CM

## 2014-08-01 DIAGNOSIS — E559 Vitamin D deficiency, unspecified: Secondary | ICD-10-CM

## 2014-08-01 MED ORDER — SIMVASTATIN 40 MG PO TABS: 40.0000 mg | ORAL_TABLET | Freq: Every day | ORAL | Status: DC

## 2014-08-01 MED ORDER — TIOTROPIUM BROMIDE MONOHYDRATE 18 MCG IN CAPS: 18.0000 ug | ORAL_CAPSULE | Freq: Every day | RESPIRATORY_TRACT | Status: DC

## 2014-08-01 MED ORDER — METHIMAZOLE 5 MG PO TABS: ORAL_TABLET | ORAL | Status: DC

## 2014-08-01 MED ORDER — FLUTICASONE-SALMETEROL 250-50 MCG/DOSE IN AEPB
1.0000 | INHALATION_SPRAY | Freq: Two times a day (BID) | RESPIRATORY_TRACT | Status: DC
Start: 2014-08-01 — End: 2014-11-18

## 2014-08-01 MED ORDER — ESCITALOPRAM OXALATE 10 MG PO TABS: ORAL_TABLET | ORAL | Status: DC

## 2014-08-01 NOTE — Patient Instructions (Signed)
Exercise to Stay Healthy Exercise helps you become and stay healthy. EXERCISE IDEAS AND TIPS Choose exercises that:  You enjoy.  Fit into your day. You do not need to exercise really hard to be healthy. You can do exercises at a slow or medium level and stay healthy. You can:  Stretch before and after working out.  Try yoga, Pilates, or tai chi.  Lift weights.  Walk fast, swim, jog, run, climb stairs, bicycle, dance, or rollerskate.  Take aerobic classes. Exercises that burn about 150 calories:  Running 1  miles in 15 minutes.  Playing volleyball for 45 to 60 minutes.  Washing and waxing a car for 45 to 60 minutes.  Playing touch football for 45 minutes.  Walking 1  miles in 35 minutes.  Pushing a stroller 1  miles in 30 minutes.  Playing basketball for 30 minutes.  Raking leaves for 30 minutes.  Bicycling 5 miles in 30 minutes.  Walking 2 miles in 30 minutes.  Dancing for 30 minutes.  Shoveling snow for 15 minutes.  Swimming laps for 20 minutes.  Walking up stairs for 15 minutes.  Bicycling 4 miles in 15 minutes.  Gardening for 30 to 45 minutes.  Jumping rope for 15 minutes.  Washing windows or floors for 45 to 60 minutes. Document Released: 06/08/2010 Document Revised: 07/29/2011 Document Reviewed: 06/08/2010 Exit[employer] Patient Information 2015 [employer]. This information is not intended to replace advice given to you by your health care provider. Make sure you discuss any questions you have with your health care provider.  

## 2014-08-01 NOTE — Progress Notes (Addendum)
Subjective:    Patient ID: Barbara Boyd, female    DOB: Jan 01, 1951, 64 y.o.   MRN: [id number]\  Patient here today for follow up of chronic medical problems.   Hyperlipidemia This is a chronic problem. The current episode started more than 1 year ago. The problem is controlled. Recent lipid tests were reviewed and are normal. Exacerbating diseases include obesity. She has no history of diabetes or hypothyroidism. Current antihyperlipidemic treatment includes statins. The current treatment provides moderate improvement of lipids. Compliance problems include adherence to exercise and adherence to diet.   Breast cancer/ bil masectomy Still oin pain meds from surgery but os doing well- had real implants put in 2 weeks ago. Graves disease Currently on methimazole- only sees [place] now for this. COPd advair bid and spirivia and is doing well- no need for albuterol in quite sometime- denies SOB and /or wheezing. Depression Lexapro- working well considering the stress she is under form cancer.   Review of Systems  Constitutional: Negative.   HENT: Negative.   Respiratory: Negative.   Cardiovascular: Negative.   Genitourinary: Negative.   Neurological: Negative.   Psychiatric/Behavioral: Negative.   All other systems reviewed and are negative.      Objective:   Physical Exam  Constitutional: She is oriented to person, place, and time. She appears well-developed and well-nourished.  HENT:  Head: Normocephalic.  Right Ear: Hearing, tympanic membrane, external ear and ear canal normal.  Left Ear: Hearing, tympanic membrane, external ear and ear canal normal.  Nose: Nose normal.  Mouth/Throat: Uvula is midline and oropharynx is clear and moist.  Eyes: Conjunctivae and EOM are normal. Pupils are equal, round, and reactive to light.  Neck: Normal range of motion and full passive range of motion without pain. Neck supple. No JVD present. Carotid bruit is not present. No thyroid mass and no  thyromegaly present.  Cardiovascular: Normal rate, normal heart sounds and intact distal pulses.   No murmur heard. Pulmonary/Chest: Effort normal and breath sounds normal.  Bilateral breast implants- no breast exam done today.  Abdominal: Soft. Bowel sounds are normal. She exhibits no mass. There is no tenderness.  Genitourinary: No breast swelling, tenderness, discharge or bleeding.  bimanual exam-No adnexal masses or tenderness Cervix parous and pink no discharge.  Musculoskeletal: Normal range of motion.  Lymphadenopathy:    She has no cervical adenopathy.  Neurological: She is alert and oriented to person, place, and time.  Skin: Skin is warm and dry.  Psychiatric: She has a normal mood and affect. Her behavior is normal. Judgment and thought content normal.    BP 109/64 mmHg  Pulse 81  Temp(Src) 97.4 F (36.3 C) (Oral)  Ht $R'5\' 2"'FV$  (1.575 m)  Wt 170 lb (77.111 kg)  BMI 31.09 kg/m2         Assessment & Plan:   1. COPD exacerbation Avoid allergens - Fluticasone-Salmeterol (ADVAIR) 250-50 MCG/DOSE AEPB; Inhale 1 puff into the lungs every 12 (twelve) hours.  Dispense: 60 each; Refill: 5 - tiotropium (SPIRIVA HANDIHALER) 18 MCG inhalation capsule; Place 1 capsule (18 mcg total) into inhaler and inhale daily.  Dispense: 30 capsule; Refill: 5  2. Depression Stress management - escitalopram (LEXAPRO) 10 MG tablet; TAKE 1 TABLET (10 MG TOTAL) BY MOUTH DAILY.  Dispense: 30 tablet; Refill: 5  3. Graves disease - Thyroid Panel With TSH - methimazole (TAPAZOLE) 5 MG tablet; TAKE 1/2 TABLET BY MOUTH DAILY  Dispense: 30 tablet; Refill: 5  4. Vitamin D deficiency Continue  vitamin d daily  5. Hyperlipidemia Low fat diet - CMP14+EGFR - NMR, lipoprofile - simvastatin (ZOCOR) 40 MG tablet; Take 1 tablet (40 mg total) by mouth daily.  Dispense: 30 tablet; Refill: 5  6. Invasive ductal carcinoma of breast, stage 1, left Completed all treatment   Labs pending Health maintenance  reviewed Diet and exercise encouraged Continue all meds Follow up  In 3 month   [place], FNP

## 2014-08-02 LAB — CMP14+EGFR
ALK PHOS: 78 IU/L (ref 39–117)
ALT: 12 IU/L (ref 0–32)
AST: 15 IU/L (ref 0–40)
Albumin/Globulin Ratio: 1.8 (ref 1.1–2.5)
Albumin: 4.1 g/dL (ref 3.6–4.8)
BUN/Creatinine Ratio: 16 (ref 11–26)
BUN: 10 mg/dL (ref 8–27)
Bilirubin Total: <0.2 mg/dL (ref 0.0–1.2)
CALCIUM: 9.4 mg/dL (ref 8.7–10.3)
CO2: 26 mmol/L (ref 18–29)
CREATININE: 0.62 mg/dL (ref 0.57–1.00)
Chloride: 101 mmol/L (ref 97–108)
GFR calc Af Amer: 111 mL/min/{1.73_m2} (ref >59)
GFR, EST NON AFRICAN AMERICAN: 96 mL/min/{1.73_m2} (ref >59)
GLUCOSE: 93 mg/dL (ref 65–99)
Globulin, Total: 2.3 g/dL (ref 1.5–4.5)
Potassium: 4.6 mmol/L (ref 3.5–5.2)
Sodium: 140 mmol/L (ref 134–144)
TOTAL PROTEIN: 6.4 g/dL (ref 6.0–8.5)

## 2014-08-02 LAB — NMR, LIPOPROFILE
Cholesterol: 197 mg/dL (ref 100–199)
HDL CHOLESTEROL BY NMR: 66 mg/dL (ref >39)
HDL Particle Number: 39.5 umol/L (ref >=30.5)
LDL Particle Number: 1380 nmol/L — ABNORMAL HIGH (ref <1000)
LDL Size: 21.5 nm (ref >20.5)
LDL-C: 120 mg/dL — ABNORMAL HIGH (ref 0–99)
LP-IR Score: <25 (ref <=45)
SMALL LDL PARTICLE NUMBER: 348 nmol/L (ref <=527)
Triglycerides by NMR: 57 mg/dL (ref 0–149)

## 2014-08-02 LAB — THYROID PANEL WITH TSH
Free Thyroxine Index: 2.5 (ref 1.2–4.9)
T3 Uptake Ratio: 27 % (ref 24–39)
T4 TOTAL: 9.4 ug/dL (ref 4.5–12.0)
TSH: 2 u[IU]/mL (ref 0.450–4.500)

## 2014-08-11 ENCOUNTER — Telehealth: Payer: Self-pay | Admitting: Nurse Practitioner

## 2014-08-15 NOTE — Telephone Encounter (Signed)
Pt aware of lab results from 3/14. Vit D was not done, she had been on medication for this, explained that we would have to redraw her blood since the original was done 2wks ago.

## 2014-09-21 ENCOUNTER — Other Ambulatory Visit (INDEPENDENT_AMBULATORY_CARE_PROVIDER_SITE_OTHER): Payer: 59

## 2014-09-21 DIAGNOSIS — R7989 Other specified abnormal findings of blood chemistry: Secondary | ICD-10-CM | POA: Diagnosis not present

## 2014-09-22 LAB — VITAMIN D 25 HYDROXY (VIT D DEFICIENCY, FRACTURES): Vit D, 25-Hydroxy: 30 ng/mL (ref 30.0–100.0)

## 2014-09-26 ENCOUNTER — Telehealth: Payer: Self-pay | Admitting: *Deleted

## 2014-09-26 MED ORDER — VITAMIN D (ERGOCALCIFEROL) 1.25 MG (50000 UNIT) PO CAPS: 50000.0000 [IU] | ORAL_CAPSULE | ORAL | Status: DC

## 2014-09-26 NOTE — Telephone Encounter (Signed)
Left message for patient to return call. Vit D script sent to pharmacy for patient to continue 50,000 once weekly for 3 months.

## 2014-09-26 NOTE — Telephone Encounter (Signed)
-----   Message from [place], [place] sent at 09/25/2014  9:25 PM EDT ----- Vitamin D is not normal of 3 months of once weekly vitamin D 50,000IU.  Recommend patient may not switch to OTC vitamin D 1000IU daily.   Recheck vitamin D level in 3 to 6 months.  Please notify patient.

## 2014-09-26 NOTE — Telephone Encounter (Signed)
Patient aware.

## 2014-10-12 ENCOUNTER — Other Ambulatory Visit: Payer: Self-pay | Admitting: Nurse Practitioner

## 2014-11-18 ENCOUNTER — Ambulatory Visit (INDEPENDENT_AMBULATORY_CARE_PROVIDER_SITE_OTHER): Payer: 59

## 2014-11-18 ENCOUNTER — Ambulatory Visit (INDEPENDENT_AMBULATORY_CARE_PROVIDER_SITE_OTHER): Payer: 59 | Admitting: Nurse Practitioner

## 2014-11-18 ENCOUNTER — Encounter: Payer: Self-pay | Admitting: Nurse Practitioner

## 2014-11-18 VITALS — BP 116/72 | HR 71 | Temp 97.3°F | Ht 62.0 in | Wt 167.6 lb

## 2014-11-18 DIAGNOSIS — E785 Hyperlipidemia, unspecified: Secondary | ICD-10-CM | POA: Diagnosis not present

## 2014-11-18 DIAGNOSIS — F329 Major depressive disorder, single episode, unspecified: Secondary | ICD-10-CM | POA: Insufficient documentation

## 2014-11-18 DIAGNOSIS — J441 Chronic obstructive pulmonary disease with (acute) exacerbation: Secondary | ICD-10-CM | POA: Insufficient documentation

## 2014-11-18 DIAGNOSIS — F17200 Nicotine dependence, unspecified, uncomplicated: Secondary | ICD-10-CM

## 2014-11-18 DIAGNOSIS — G4700 Insomnia, unspecified: Secondary | ICD-10-CM | POA: Insufficient documentation

## 2014-11-18 DIAGNOSIS — M8580 Other specified disorders of bone density and structure, unspecified site: Secondary | ICD-10-CM | POA: Diagnosis not present

## 2014-11-18 DIAGNOSIS — Z720 Tobacco use: Secondary | ICD-10-CM

## 2014-11-18 DIAGNOSIS — E559 Vitamin D deficiency, unspecified: Secondary | ICD-10-CM

## 2014-11-18 DIAGNOSIS — J410 Simple chronic bronchitis: Secondary | ICD-10-CM | POA: Diagnosis not present

## 2014-11-18 DIAGNOSIS — G43001 Migraine without aura, not intractable, with status migrainosus: Secondary | ICD-10-CM | POA: Diagnosis not present

## 2014-11-18 DIAGNOSIS — E0500 Thyrotoxicosis with diffuse goiter without thyrotoxic crisis or storm: Secondary | ICD-10-CM

## 2014-11-18 DIAGNOSIS — F32A Depression, unspecified: Secondary | ICD-10-CM

## 2014-11-18 MED ORDER — ESCITALOPRAM OXALATE 10 MG PO TABS: ORAL_TABLET | ORAL | Status: DC

## 2014-11-18 MED ORDER — METHIMAZOLE 5 MG PO TABS: ORAL_TABLET | ORAL | Status: DC

## 2014-11-18 MED ORDER — SIMVASTATIN 40 MG PO TABS
40.0000 mg | ORAL_TABLET | Freq: Every day | ORAL | Status: DC
Start: 2014-11-18 — End: 2015-03-07

## 2014-11-18 MED ORDER — FLUTICASONE-SALMETEROL 250-50 MCG/DOSE IN AEPB: 1.0000 | INHALATION_SPRAY | Freq: Two times a day (BID) | RESPIRATORY_TRACT | Status: DC

## 2014-11-18 MED ORDER — ZOLPIDEM TARTRATE 5 MG PO TABS: 5.0000 mg | ORAL_TABLET | Freq: Every evening | ORAL | Status: DC | PRN

## 2014-11-18 MED ORDER — TIOTROPIUM BROMIDE MONOHYDRATE 18 MCG IN CAPS: 18.0000 ug | ORAL_CAPSULE | Freq: Every day | RESPIRATORY_TRACT | Status: DC

## 2014-11-18 MED ORDER — SUMATRIPTAN SUCCINATE 100 MG PO TABS: 100.0000 mg | ORAL_TABLET | ORAL | Status: DC | PRN

## 2014-11-18 NOTE — Progress Notes (Signed)
Subjective:    Patient ID: Barbara Boyd, female    DOB: 05-27-1950, 64 y.o.   MRN: [id number]   Patient here today for follow up of chronic medical problems.   * her only complaint today is trouble sleeping- trouble falling asleep and staying asleep  Hyperlipidemia This is a chronic problem. Recent lipid tests were reviewed and are variable. Current antihyperlipidemic treatment includes statins. The current treatment provides moderate improvement of lipids. Compliance problems include adherence to diet and adherence to exercise.  Risk factors for coronary artery disease include dyslipidemia and post-menopausal.  COPD Currently on spiriva daily- no recent flare ups- no c/o SOB migraines Uses imitrex as needed- has about 1-2 a month. Imitrex works well. Graves disease  No longer sees endocrinologist- is currently on methimazole- no complaints depression lexapro daily- working well- no side effects  Review of Systems  Constitutional: Negative.   HENT: Negative.   Respiratory: Negative.   Cardiovascular: Negative.   Genitourinary: Negative.   Neurological: Negative.   Psychiatric/Behavioral: Negative.   All other systems reviewed and are negative.      Objective:   Physical Exam  Constitutional: She is oriented to person, place, and time. She appears well-developed and well-nourished.  HENT:  Nose: Nose normal.  Mouth/Throat: Oropharynx is clear and moist.  Eyes: EOM are normal.  Neck: Trachea normal, normal range of motion and full passive range of motion without pain. Neck supple. No JVD present. Carotid bruit is not present. No thyromegaly present.  Cardiovascular: Normal rate, regular rhythm, normal heart sounds and intact distal pulses.  Exam reveals no gallop and no friction rub.   No murmur heard. Pulmonary/Chest: Effort normal and breath sounds normal.  Abdominal: Soft. Bowel sounds are normal. She exhibits no distension and no mass. There is no tenderness.    Musculoskeletal: Normal range of motion.  Lymphadenopathy:    She has no cervical adenopathy.  Neurological: She is alert and oriented to person, place, and time. She has normal reflexes.  Skin: Skin is warm and dry.  Psychiatric: She has a normal mood and affect. Her behavior is normal. Judgment and thought content normal.    BP 116/72 mmHg  Pulse 71  Temp(Src) 97.3 F (36.3 C) (Oral)  Ht 5' 2"  (1.575 m)  Wt 167 lb 9.6 oz (76.023 kg)  BMI 30.65 kg/m2  Chest x ray- mild bronchitic changes-Preliminary reading by Ronnald Collum, FNP  [facility]   EKG-NSR- [name], FNP      Assessment & Plan:  1. Migraine without aura and with status migrainosus, not intractable Avoid caffeine - SUMAtriptan (IMITREX) 100 MG tablet; Take 1 tablet (100 mg total) by mouth as needed. May repeat in 2 hours if headache persists or recurs.  Dispense: 9 tablet; Refill: 5  2. Simple chronic bronchitis STOP smoking - tiotropium (SPIRIVA HANDIHALER) 18 MCG inhalation capsule; Place 1 capsule (18 mcg total) into inhaler and inhale daily.  Dispense: 30 capsule; Refill: 5 - Fluticasone-Salmeterol (ADVAIR) 250-50 MCG/DOSE AEPB; Inhale 1 puff into the lungs every 12 (twelve) hours.  Dispense: 60 each; Refill: 5  3. Graves disease - Thyroid Panel With TSH - methimazole (TAPAZOLE) 5 MG tablet; TAKE 1/2 TABLET BY MOUTH DAILY  Dispense: 30 tablet; Refill: 5  4. Osteopenia Weight bearing exercises  5. Vitamin D deficiency  6. Hyperlipidemia Low fat diet - EKG 12-Lead - CMP14+EGFR - Lipid panel - simvastatin (ZOCOR) 40 MG tablet; Take 1 tablet (40 mg total) by mouth daily.  Dispense: 30  tablet; Refill: 5  7. Depression Stress management - escitalopram (LEXAPRO) 10 MG tablet; TAKE 1 TABLET (10 MG TOTAL) BY MOUTH DAILY.  Dispense: 30 tablet; Refill: 5   8. Smoker Encouraged to stop smoking - DG Chest 2 View; Future  9. Insomnia Bedtime ritual - zolpidem (AMBIEN) 5 MG tablet; Take 1 tablet (5 mg  total) by mouth at bedtime as needed for sleep.  Dispense: 30 tablet; Refill: 1    Labs pending Health maintenance reviewed Diet and exercise encouraged Continue all meds Follow up  In 3 month  [place], FNP

## 2014-11-18 NOTE — Patient Instructions (Signed)
Fat and Cholesterol Control Diet Fat and cholesterol levels in your blood and organs are influenced by your diet. High levels of fat and cholesterol may lead to diseases of the heart, small and large blood vessels, gallbladder, liver, and pancreas. CONTROLLING FAT AND CHOLESTEROL WITH DIET Although exercise and lifestyle factors are important, your diet is key. That is because certain foods are known to raise cholesterol and others to lower it. The goal is to balance foods for their effect on cholesterol and more importantly, to replace saturated and trans fat with other types of fat, such as monounsaturated fat, polyunsaturated fat, and omega-3 fatty acids. On average, a person should consume no more than 15 to 17 g of saturated fat daily. Saturated and trans fats are considered "bad" fats, and they will raise LDL cholesterol. Saturated fats are primarily found in animal products such as meats, butter, and cream. However, that does not mean you need to give up all your favorite foods. Today, there are good tasting, low-fat, low-cholesterol substitutes for most of the things you like to eat. Choose low-fat or nonfat alternatives. Choose round or loin cuts of red meat. These types of cuts are lowest in fat and cholesterol. Chicken (without the skin), fish, veal, and ground turkey breast are great choices. Eliminate fatty meats, such as hot dogs and salami. Even shellfish have little or no saturated fat. Have a 3 oz (85 g) portion when you eat lean meat, poultry, or fish. Trans fats are also called "partially hydrogenated oils." They are oils that have been scientifically manipulated so that they are solid at room temperature resulting in a longer shelf life and improved taste and texture of foods in which they are added. Trans fats are found in stick margarine, some tub margarines, cookies, crackers, and baked goods.  When baking and cooking, oils are a great substitute for butter. The monounsaturated oils are  especially beneficial since it is believed they lower LDL and raise HDL. The oils you should avoid entirely are saturated tropical oils, such as coconut and palm.  Remember to eat a lot from food groups that are naturally free of saturated and trans fat, including fish, fruit, vegetables, beans, grains (barley, rice, couscous, bulgur wheat), and pasta (without cream sauces).  IDENTIFYING FOODS THAT LOWER FAT AND CHOLESTEROL  Soluble fiber may lower your cholesterol. This type of fiber is found in fruits such as apples, vegetables such as broccoli, potatoes, and carrots, legumes such as beans, peas, and lentils, and grains such as barley. Foods fortified with plant sterols (phytosterol) may also lower cholesterol. You should eat at least 2 g per day of these foods for a cholesterol lowering effect.  Read package labels to identify low-saturated fats, trans fat free, and low-fat foods at the supermarket. Select cheeses that have only 2 to 3 g saturated fat per ounce. Use a heart-healthy tub margarine that is free of trans fats or partially hydrogenated oil. When buying baked goods (cookies, crackers), avoid partially hydrogenated oils. Breads and muffins should be made from whole grains (whole-wheat or whole oat flour, instead of "flour" or "enriched flour"). Buy non-creamy canned soups with reduced salt and no added fats.  FOOD PREPARATION TECHNIQUES  Never deep-fry. If you must fry, either stir-fry, which uses very little fat, or use non-stick cooking sprays. When possible, broil, bake, or roast meats, and steam vegetables. Instead of putting butter or margarine on vegetables, use lemon and herbs, applesauce, and cinnamon (for squash and sweet potatoes). Use nonfat   yogurt, salsa, and low-fat dressings for salads.  LOW-SATURATED FAT / LOW-FAT FOOD SUBSTITUTES Meats / Saturated Fat (g)  Avoid: Steak, marbled (3 oz/85 g) / 11 g  Choose: Steak, lean (3 oz/85 g) / 4 g  Avoid: Hamburger (3 oz/85 g) / 7  g  Choose: Hamburger, lean (3 oz/85 g) / 5 g  Avoid: Ham (3 oz/85 g) / 6 g  Choose: Ham, lean cut (3 oz/85 g) / 2.4 g  Avoid: Chicken, with skin, dark meat (3 oz/85 g) / 4 g  Choose: Chicken, skin removed, dark meat (3 oz/85 g) / 2 g  Avoid: Chicken, with skin, light meat (3 oz/85 g) / 2.5 g  Choose: Chicken, skin removed, light meat (3 oz/85 g) / 1 g Dairy / Saturated Fat (g)  Avoid: Whole milk (1 cup) / 5 g  Choose: Low-fat milk, 2% (1 cup) / 3 g  Choose: Low-fat milk, 1% (1 cup) / 1.5 g  Choose: Skim milk (1 cup) / 0.3 g  Avoid: Hard cheese (1 oz/28 g) / 6 g  Choose: Skim milk cheese (1 oz/28 g) / 2 to 3 g  Avoid: Cottage cheese, 4% fat (1 cup) / 6.5 g  Choose: Low-fat cottage cheese, 1% fat (1 cup) / 1.5 g  Avoid: Ice cream (1 cup) / 9 g  Choose: Sherbet (1 cup) / 2.5 g  Choose: Nonfat frozen yogurt (1 cup) / 0.3 g  Choose: Frozen fruit bar / trace  Avoid: Whipped cream (1 tbs) / 3.5 g  Choose: Nondairy whipped topping (1 tbs) / 1 g Condiments / Saturated Fat (g)  Avoid: Mayonnaise (1 tbs) / 2 g  Choose: Low-fat mayonnaise (1 tbs) / 1 g  Avoid: Butter (1 tbs) / 7 g  Choose: Extra light margarine (1 tbs) / 1 g  Avoid: Coconut oil (1 tbs) / 11.8 g  Choose: Olive oil (1 tbs) / 1.8 g  Choose: Corn oil (1 tbs) / 1.7 g  Choose: Safflower oil (1 tbs) / 1.2 g  Choose: Sunflower oil (1 tbs) / 1.4 g  Choose: Soybean oil (1 tbs) / 2.4 g  Choose: Canola oil (1 tbs) / 1 g Document Released: 05/06/2005 Document Revised: 08/31/2012 Document Reviewed: 08/04/2013 [employer] Patient Information 2015 [employer]. This information is not intended to replace advice given to you by your health care provider. Make sure you discuss any questions you have with your health care provider.  

## 2014-11-19 LAB — LIPID PANEL
CHOLESTEROL TOTAL: 174 mg/dL (ref 100–199)
Chol/HDL Ratio: 2.7 ratio units (ref 0.0–4.4)
HDL: 64 mg/dL (ref >39)
LDL Calculated: 97 mg/dL (ref 0–99)
Triglycerides: 63 mg/dL (ref 0–149)
VLDL Cholesterol Cal: 13 mg/dL (ref 5–40)

## 2014-11-19 LAB — CMP14+EGFR
A/G RATIO: 1.7 (ref 1.1–2.5)
ALBUMIN: 4.5 g/dL (ref 3.6–4.8)
ALT: 12 IU/L (ref 0–32)
AST: 17 IU/L (ref 0–40)
Alkaline Phosphatase: 72 IU/L (ref 39–117)
BILIRUBIN TOTAL: 0.3 mg/dL (ref 0.0–1.2)
BUN / CREAT RATIO: 25 (ref 11–26)
BUN: 14 mg/dL (ref 8–27)
CO2: 24 mmol/L (ref 18–29)
CREATININE: 0.55 mg/dL — AB (ref 0.57–1.00)
Calcium: 9.3 mg/dL (ref 8.7–10.3)
Chloride: 97 mmol/L (ref 97–108)
GFR calc non Af Amer: 99 mL/min/{1.73_m2} (ref >59)
GFR, EST AFRICAN AMERICAN: 115 mL/min/{1.73_m2} (ref >59)
Globulin, Total: 2.6 g/dL (ref 1.5–4.5)
Glucose: 86 mg/dL (ref 65–99)
Potassium: 4.4 mmol/L (ref 3.5–5.2)
SODIUM: 137 mmol/L (ref 134–144)
TOTAL PROTEIN: 7.1 g/dL (ref 6.0–8.5)

## 2014-11-19 LAB — THYROID PANEL WITH TSH
Free Thyroxine Index: 2.3 (ref 1.2–4.9)
T3 Uptake Ratio: 25 % (ref 24–39)
T4 TOTAL: 9.2 ug/dL (ref 4.5–12.0)
TSH: 2.01 u[IU]/mL (ref 0.450–4.500)

## 2015-01-23 ENCOUNTER — Other Ambulatory Visit: Payer: Self-pay | Admitting: Nurse Practitioner

## 2015-03-07 ENCOUNTER — Ambulatory Visit (INDEPENDENT_AMBULATORY_CARE_PROVIDER_SITE_OTHER): Payer: 59 | Admitting: Family

## 2015-03-07 ENCOUNTER — Encounter: Payer: Self-pay | Admitting: Family

## 2015-03-07 VITALS — BP 119/69 | HR 71 | Temp 97.0°F | Ht 62.0 in | Wt 167.6 lb

## 2015-03-07 DIAGNOSIS — Z8742 Personal history of other diseases of the female genital tract: Secondary | ICD-10-CM | POA: Diagnosis not present

## 2015-03-07 DIAGNOSIS — R1032 Left lower quadrant pain: Secondary | ICD-10-CM

## 2015-03-07 DIAGNOSIS — Z86018 Personal history of other benign neoplasm: Secondary | ICD-10-CM

## 2015-03-07 LAB — POCT URINALYSIS DIPSTICK
Bilirubin, UA: NEGATIVE
Glucose, UA: NEGATIVE
Ketones, UA: NEGATIVE
Leukocytes, UA: NEGATIVE
NITRITE UA: NEGATIVE
PH UA: 7
Protein, UA: NEGATIVE
Spec Grav, UA: 1.01
UROBILINOGEN UA: NEGATIVE

## 2015-03-07 LAB — POCT UA - MICROSCOPIC ONLY
BACTERIA, U MICROSCOPIC: NEGATIVE
CASTS, UR, LPF, POC: NEGATIVE
Crystals, Ur, HPF, POC: NEGATIVE
Mucus, UA: NEGATIVE
WBC, Ur, HPF, POC: NEGATIVE
YEAST UA: NEGATIVE

## 2015-03-07 MED ORDER — TRAMADOL HCL 50 MG PO TABS: 100.0000 mg | ORAL_TABLET | Freq: Three times a day (TID) | ORAL | Status: DC | PRN

## 2015-03-07 NOTE — Progress Notes (Signed)
   Subjective:    Patient ID: Barbara Boyd, female    DOB: Apr 22, 1951, 64 y.o.   MRN: [id number]  Abdominal Pain This is a new problem. The current episode started in the past 7 days. The onset quality is sudden. The problem occurs constantly. The problem has been waxing and waning. The pain is located in the LLQ. The pain is at a severity of 6/10. The pain is moderate. Quality: throbbing. Pertinent negatives include no constipation, diarrhea, dysuria, fever, frequency, headaches, hematuria, nausea or vomiting. The pain is aggravated by certain positions. She has tried acetaminophen for the symptoms. The treatment provided moderate relief. Pt states she had an ultrasound in the past and was told she had a fibroid tumor     Review of Systems  Constitutional: Negative.  Negative for fever.  HENT: Negative.   Eyes: Negative.   Respiratory: Negative.  Negative for shortness of breath.   Cardiovascular: Negative.  Negative for palpitations.  Gastrointestinal: Positive for abdominal pain. Negative for nausea, vomiting, diarrhea and constipation.  Endocrine: Negative.   Genitourinary: Negative.  Negative for dysuria, frequency and hematuria.  Musculoskeletal: Negative.   Neurological: Negative.  Negative for headaches.  Hematological: Negative.   Psychiatric/Behavioral: Negative.   All other systems reviewed and are negative.      Objective:   Physical Exam  Constitutional: She is oriented to person, place, and time. She appears well-developed and well-nourished. No distress.  HENT:  Head: Normocephalic and atraumatic.  Right Ear: External ear normal.  Mouth/Throat: Oropharynx is clear and moist.  Eyes: Pupils are equal, round, and reactive to light.  Neck: Normal range of motion. Neck supple. No thyromegaly present.  Cardiovascular: Normal rate, regular rhythm, normal heart sounds and intact distal pulses.   No murmur heard. Pulmonary/Chest: Effort normal and breath sounds normal. No  respiratory distress. She has no wheezes.  Abdominal: Soft. Bowel sounds are normal. She exhibits no distension. There is no tenderness.  Musculoskeletal: Normal range of motion. She exhibits no edema or tenderness.  Neurological: She is alert and oriented to person, place, and time. She has normal reflexes. No cranial nerve deficit.  Skin: Skin is warm and dry.  Psychiatric: She has a normal mood and affect. Her behavior is normal. Judgment and thought content normal.  Vitals reviewed.     BP 119/69 mmHg  Pulse 71  Temp(Src) 97 F (36.1 C) (Oral)  Ht $R'5\' 2"'Wm$  (1.575 m)  Wt 167 lb 9.6 oz (76.023 kg)  BMI 30.65 kg/m2     Assessment & Plan:  1. LLQ abdominal pain - POCT UA - Microscopic Only - POCT urinalysis dipstick - CMP14+EGFR - CBC with Differential/Platelet  2. History of uterine fibroid - US Transvaginal Non-OB; Future - US Pelvis Complete; Future   Will do ultrasound to rule out any change with her fibroid tumor. Pt denies any fever, nausea, of mucus stool to rule out diverticulitis Labs pending Ultram 50 mg #60 given to pt RTO 1 week  Evelina Dun, FNP

## 2015-03-07 NOTE — Patient Instructions (Addendum)
Ovarian Cyst An ovarian cyst is a fluid-filled sac that forms on an ovary. The ovaries are small organs that produce eggs in women. Various types of cysts can form on the ovaries. Most are not cancerous. Many do not cause problems, and they often go away on their own. Some may cause symptoms and require treatment. Common types of ovarian cysts include:  Functional cysts--These cysts may occur every month during the menstrual cycle. This is normal. The cysts usually go away with the next menstrual cycle if the woman does not get pregnant. Usually, there are no symptoms with a functional cyst.  Endometrioma cysts--These cysts form from the tissue that lines the uterus. They are also called "chocolate cysts" because they become filled with blood that turns brown. This type of cyst can cause pain in the lower abdomen during intercourse and with your menstrual period.  Cystadenoma cysts--This type develops from the cells on the outside of the ovary. These cysts can get very big and cause lower abdomen pain and pain with intercourse. This type of cyst can twist on itself, cut off its blood supply, and cause severe pain. It can also easily rupture and cause a lot of pain.  Dermoid cysts--This type of cyst is sometimes found in both ovaries. These cysts may contain different kinds of body tissue, such as skin, teeth, hair, or cartilage. They usually do not cause symptoms unless they get very big.  Theca lutein cysts--These cysts occur when too much of a certain hormone (human chorionic gonadotropin) is produced and overstimulates the ovaries to produce an egg. This is most common after procedures used to assist with the conception of a baby (in vitro fertilization). CAUSES   Fertility drugs can cause a condition in which multiple large cysts are formed on the ovaries. This is called ovarian hyperstimulation syndrome.  A condition called polycystic ovary syndrome can cause hormonal imbalances that can lead to  nonfunctional ovarian cysts. SIGNS AND SYMPTOMS  Many ovarian cysts do not cause symptoms. If symptoms are present, they may include:  Pelvic pain or pressure.  Pain in the lower abdomen.  Pain during sexual intercourse.  Increasing girth (swelling) of the abdomen.  Abnormal menstrual periods.  Increasing pain with menstrual periods.  Stopping having menstrual periods without being pregnant. DIAGNOSIS  These cysts are commonly found during a routine or annual pelvic exam. Tests may be ordered to find out more about the cyst. These tests may include:  Ultrasound.  X-ray of the pelvis.  CT scan.  MRI.  Blood tests. TREATMENT  Many ovarian cysts go away on their own without treatment. Your health care provider may want to check your cyst regularly for 2-3 months to see if it changes. For women in menopause, it is particularly important to monitor a cyst closely because of the higher rate of ovarian cancer in menopausal women. When treatment is needed, it may include any of the following:  A procedure to drain the cyst (aspiration). This may be done using a long needle and ultrasound. It can also be done through a laparoscopic procedure. This involves using a thin, lighted tube with a tiny camera on the end (laparoscope) inserted through a small incision.  Surgery to remove the whole cyst. This may be done using laparoscopic surgery or an open surgery involving a larger incision in the lower abdomen.  Hormone treatment or birth control pills. These methods are sometimes used to help dissolve a cyst. HOME CARE INSTRUCTIONS   Only take over-the-counter   or prescription medicines as directed by your health care provider.  Follow up with your health care provider as directed.  Get regular pelvic exams and Pap tests. SEEK MEDICAL CARE IF:   Your periods are late, irregular, or painful, or they stop.  Your pelvic pain or abdominal pain does not go away.  Your abdomen becomes  larger or swollen.  You have pressure on your bladder or trouble emptying your bladder completely.  You have pain during sexual intercourse.  You have feelings of fullness, pressure, or discomfort in your stomach.  You lose weight for no apparent reason.  You feel generally ill.  You become constipated.  You lose your appetite.  You develop acne.  You have an increase in body and facial hair.  You are gaining weight, without changing your exercise and eating habits.  You think you are pregnant. SEEK IMMEDIATE MEDICAL CARE IF:   You have increasing abdominal pain.  You feel sick to your stomach (nauseous), and you throw up (vomit).  You develop a fever that comes on suddenly.  You have abdominal pain during a bowel movement.  Your menstrual periods become heavier than usual. MAKE SURE YOU:  Understand these instructions.  Will watch your condition.  Will get help right away if you are not doing well or get worse.   This information is not intended to replace advice given to you by your health care provider. Make sure you discuss any questions you have with your health care provider.   Document Released: 05/06/2005 Document Revised: 05/11/2013 Document Reviewed: 01/11/2013 Elsevier Interactive Patient Education 2016 [employer] Uterine Fibroids Uterine fibroids are tissue masses (tumors) that can develop in the womb (uterus). They are also called leiomyomas. This type of tumor is not cancerous (benign) and does not spread to other parts of the body outside of the pelvic area, which is between the hip bones. Occasionally, fibroids may develop in the fallopian tubes, in the cervix, or on the support structures (ligaments) that surround the uterus. You can have one or many fibroids. Fibroids can vary in size, weight, and where they grow in the uterus. Some can become quite large. Most fibroids do not require medical treatment. CAUSES A fibroid can develop when a single  uterine cell keeps growing (replicating). Most cells in the human body have a control mechanism that keeps them from replicating without control. SIGNS AND SYMPTOMS Symptoms may include:   Heavy bleeding during your period.  Bleeding or spotting between periods.  Pelvic pain and pressure.  Bladder problems, such as needing to urinate more often (urinary frequency) or urgently.  Inability to reproduce offspring (infertility).  Miscarriages. DIAGNOSIS Uterine fibroids are diagnosed through a physical exam. Your health care provider may feel the lumpy tumors during a pelvic exam. Ultrasonography and an MRI may be done to determine the size, location, and number of fibroids. TREATMENT Treatment may include:  Watchful waiting. This involves getting the fibroid checked by your health care provider to see if it grows or shrinks. Follow your health care provider's recommendations for how often to have this checked.  Hormone medicines. These can be taken by mouth or given through an intrauterine device (IUD).  Surgery.  Removing the fibroids (myomectomy) or the uterus (hysterectomy).  Removing blood supply to the fibroids (uterine artery embolization). If fibroids interfere with your fertility and you want to become pregnant, your health care provider may recommend having the fibroids removed.  HOME CARE INSTRUCTIONS  Keep all follow-up visits as  directed by your health care provider. This is important.  Take medicines only as directed by your health care provider.  If you were prescribed a hormone treatment, take the hormone medicines exactly as directed.  Do not take aspirin, because it can cause bleeding.  Ask your health care provider about taking iron pills and increasing the amount of dark green, leafy vegetables in your diet. These actions can help to boost your blood iron levels, which may be affected by heavy menstrual bleeding.  Pay close attention to your period and tell  your health care provider about any changes, such as:  Increased blood flow that requires you to use more pads or tampons than usual per month.  A change in the number of days that your period lasts per month.  A change in symptoms that are associated with your period, such as abdominal cramping or back pain. SEEK MEDICAL CARE IF:  You have pelvic pain, back pain, or abdominal cramps that cannot be controlled with medicines.  You have an increase in bleeding between and during periods.  You soak tampons or pads in a half hour or less.  You feel lightheaded, extra tired, or weak. SEEK IMMEDIATE MEDICAL CARE IF:  You faint.  You have a sudden increase in pelvic pain.   This information is not intended to replace advice given to you by your health care provider. Make sure you discuss any questions you have with your health care provider.   Document Released: 05/03/2000 Document Revised: 05/27/2014 Document Reviewed: 11/02/2013 Elsevier Interactive Patient Education [employer].

## 2015-03-08 LAB — CBC WITH DIFFERENTIAL/PLATELET
Basophils Absolute: 0 10*3/uL (ref 0.0–0.2)
Basos: 1 %
EOS (ABSOLUTE): 0.2 10*3/uL (ref 0.0–0.4)
Eos: 4 %
HEMOGLOBIN: 14.8 g/dL (ref 11.1–15.9)
Hematocrit: 43.7 % (ref 34.0–46.6)
Immature Grans (Abs): 0 10*3/uL (ref 0.0–0.1)
Immature Granulocytes: 0 %
Lymphocytes Absolute: 2.1 10*3/uL (ref 0.7–3.1)
Lymphs: 36 %
MCH: 33.6 pg — ABNORMAL HIGH (ref 26.6–33.0)
MCHC: 33.9 g/dL (ref 31.5–35.7)
MCV: 99 fL — ABNORMAL HIGH (ref 79–97)
MONOCYTES: 11 %
Monocytes Absolute: 0.6 10*3/uL (ref 0.1–0.9)
NEUTROS PCT: 48 %
Neutrophils Absolute: 2.8 10*3/uL (ref 1.4–7.0)
Platelets: 325 10*3/uL (ref 150–379)
RBC: 4.4 x10E6/uL (ref 3.77–5.28)
RDW: 13.1 % (ref 12.3–15.4)
WBC: 5.8 10*3/uL (ref 3.4–10.8)

## 2015-03-08 LAB — CMP14+EGFR
ALT: 15 IU/L (ref 0–32)
AST: 21 IU/L (ref 0–40)
Albumin/Globulin Ratio: 1.8 (ref 1.1–2.5)
Albumin: 4.2 g/dL (ref 3.6–4.8)
Alkaline Phosphatase: 68 IU/L (ref 39–117)
BILIRUBIN TOTAL: 0.3 mg/dL (ref 0.0–1.2)
BUN/Creatinine Ratio: 15 (ref 11–26)
BUN: 10 mg/dL (ref 8–27)
CHLORIDE: 97 mmol/L (ref 97–106)
CO2: 27 mmol/L (ref 18–29)
Calcium: 9.5 mg/dL (ref 8.7–10.3)
Creatinine, Ser: 0.65 mg/dL (ref 0.57–1.00)
GFR calc Af Amer: 109 mL/min/{1.73_m2} (ref >59)
GFR calc non Af Amer: 94 mL/min/{1.73_m2} (ref >59)
Globulin, Total: 2.4 g/dL (ref 1.5–4.5)
Glucose: 88 mg/dL (ref 65–99)
Potassium: 4.1 mmol/L (ref 3.5–5.2)
Sodium: 139 mmol/L (ref 136–144)
Total Protein: 6.6 g/dL (ref 6.0–8.5)

## 2015-03-14 ENCOUNTER — Ambulatory Visit: Payer: 59 | Admitting: Family

## 2015-03-15 ENCOUNTER — Ambulatory Visit (HOSPITAL_COMMUNITY)
Admission: RE | Admit: 2015-03-15 | Discharge: 2015-03-15 | Disposition: A | Discharge status: Home / Self Care | Payer: 59 | Source: Ambulatory Visit | Attending: Family | Admitting: Family

## 2015-03-15 DIAGNOSIS — D259 Leiomyoma of uterus, unspecified: Secondary | ICD-10-CM | POA: Insufficient documentation

## 2015-03-15 DIAGNOSIS — R1032 Left lower quadrant pain: Secondary | ICD-10-CM | POA: Diagnosis present

## 2015-03-15 DIAGNOSIS — Z8742 Personal history of other diseases of the female genital tract: Secondary | ICD-10-CM | POA: Diagnosis not present

## 2015-03-15 DIAGNOSIS — Z86018 Personal history of other benign neoplasm: Secondary | ICD-10-CM

## 2015-03-17 ENCOUNTER — Ambulatory Visit (INDEPENDENT_AMBULATORY_CARE_PROVIDER_SITE_OTHER): Payer: 59

## 2015-03-17 ENCOUNTER — Encounter: Payer: Self-pay | Admitting: Family

## 2015-03-17 ENCOUNTER — Ambulatory Visit (INDEPENDENT_AMBULATORY_CARE_PROVIDER_SITE_OTHER): Payer: 59 | Admitting: Family

## 2015-03-17 VITALS — BP 114/67 | HR 70 | Temp 96.9°F | Ht 62.0 in | Wt 169.8 lb

## 2015-03-17 DIAGNOSIS — R1032 Left lower quadrant pain: Secondary | ICD-10-CM | POA: Diagnosis not present

## 2015-03-17 DIAGNOSIS — K5732 Diverticulitis of large intestine without perforation or abscess without bleeding: Secondary | ICD-10-CM

## 2015-03-17 DIAGNOSIS — Z23 Encounter for immunization: Secondary | ICD-10-CM

## 2015-03-17 MED ORDER — CIPROFLOXACIN HCL 500 MG PO TABS: 500.0000 mg | ORAL_TABLET | Freq: Two times a day (BID) | ORAL | Status: DC

## 2015-03-17 MED ORDER — METRONIDAZOLE 500 MG PO TABS: 500.0000 mg | ORAL_TABLET | Freq: Two times a day (BID) | ORAL | Status: DC

## 2015-03-17 NOTE — Patient Instructions (Signed)
Diverticulitis °Diverticulitis is inflammation or infection of small pouches in your colon that form when you have a condition called diverticulosis. The pouches in your colon are called diverticula. Your colon, or large intestine, is where water is absorbed and stool is formed. °Complications of diverticulitis can include: °· Bleeding. °· Severe infection. °· Severe pain. °· Perforation of your colon. °· Obstruction of your colon. °CAUSES  °Diverticulitis is caused by bacteria. °Diverticulitis happens when stool becomes trapped in diverticula. This allows bacteria to grow in the diverticula, which can lead to inflammation and infection. °RISK FACTORS °People with diverticulosis are at risk for diverticulitis. Eating a diet that does not include enough fiber from fruits and vegetables may make diverticulitis more likely to develop. °SYMPTOMS  °Symptoms of diverticulitis may include: °· Abdominal pain and tenderness. The pain is normally located on the left side of the abdomen, but may occur in other areas. °· Fever and chills. °· Bloating. °· Cramping. °· Nausea. °· Vomiting. °· Constipation. °· Diarrhea. °· Blood in your stool. °DIAGNOSIS  °Your health care provider will ask you about your medical history and do a physical exam. You may need to have tests done because many medical conditions can cause the same symptoms as diverticulitis. Tests may include: °· Blood tests. °· Urine tests. °· Imaging tests of the abdomen, including X-rays and CT scans. °When your condition is under control, your health care provider may recommend that you have a colonoscopy. A colonoscopy can show how severe your diverticula are and whether something else is causing your symptoms. °TREATMENT  °Most cases of diverticulitis are mild and can be treated at home. Treatment may include: °· Taking over-the-counter pain medicines. °· Following a clear liquid diet. °· Taking antibiotic medicines by mouth for 7-10 days. °More severe cases may  be treated at a hospital. Treatment may include: °· Not eating or drinking. °· Taking prescription pain medicine. °· Receiving antibiotic medicines through an IV tube. °· Receiving fluids and nutrition through an IV tube. °· Surgery. °HOME CARE INSTRUCTIONS  °· Follow your health care provider's instructions carefully. °· Follow a full liquid diet or other diet as directed by your health care provider. After your symptoms improve, your health care provider may tell you to change your diet. He or she may recommend you eat a high-fiber diet. Fruits and vegetables are good sources of fiber. Fiber makes it easier to pass stool. °· Take fiber supplements or probiotics as directed by your health care provider. °· Only take medicines as directed by your health care provider. °· Keep all your follow-up appointments. °SEEK MEDICAL CARE IF:  °· Your pain does not improve. °· You have a hard time eating food. °· Your bowel movements do not return to normal. °SEEK IMMEDIATE MEDICAL CARE IF:  °· Your pain becomes worse. °· Your symptoms do not get better. °· Your symptoms suddenly get worse. °· You have a fever. °· You have repeated vomiting. °· You have bloody or black, tarry stools. °MAKE SURE YOU:  °· Understand these instructions. °· Will watch your condition. °· Will get help right away if you are not doing well or get worse. °  °This information is not intended to replace advice given to you by your health care provider. Make sure you discuss any questions you have with your health care provider. °  °Document Released: 02/13/2005 Document Revised: 05/11/2013 Document Reviewed: 03/31/2013 °Elsevier Interactive Patient Education ©2016 [employer] ° °

## 2015-03-17 NOTE — Progress Notes (Signed)
   Subjective:    Patient ID: Barbara Boyd, female    DOB: December 06, 1950, 64 y.o.   MRN: [id number]    HPI Pt presents to the office to recheck LLQ abd pain. Pt had a transvaginal ultrasound that was negative. PT states she continues to have this constant LLQ pain of a 5 out 10. Pt states she had a colonoscopy in December and was told she had diverticulosis . Pt denies any nausea, vomiting, diarrhea, or constipation.    Review of Systems  Constitutional: Negative.   HENT: Negative.   Eyes: Negative.   Respiratory: Negative.  Negative for shortness of breath.   Cardiovascular: Negative.  Negative for palpitations.  Gastrointestinal: Negative.   Endocrine: Negative.   Genitourinary: Negative.   Musculoskeletal: Negative.   Neurological: Negative.  Negative for headaches.  Hematological: Negative.   Psychiatric/Behavioral: Negative.   All other systems reviewed and are negative.      Objective:   Physical Exam  Constitutional: She is oriented to person, place, and time. She appears well-developed and well-nourished. No distress.  HENT:  Head: Normocephalic and atraumatic.  Eyes: Pupils are equal, round, and reactive to light.  Neck: Normal range of motion. Neck supple. No thyromegaly present.  Cardiovascular: Normal rate, regular rhythm, normal heart sounds and intact distal pulses.   No murmur heard. Pulmonary/Chest: Effort normal and breath sounds normal. No respiratory distress. She has no wheezes.  Abdominal: Soft. Bowel sounds are normal. She exhibits no distension. There is no tenderness.  Musculoskeletal: Normal range of motion. She exhibits no edema or tenderness.  Neurological: She is alert and oriented to person, place, and time. She has normal reflexes. No cranial nerve deficit.  Skin: Skin is warm and dry.  Psychiatric: She has a normal mood and affect. Her behavior is normal. Judgment and thought content normal.  Vitals reviewed.     BP 114/67 mmHg  Pulse 70   Temp(Src) 96.9 F (36.1 C) (Oral)  Ht 5\' 2"  (1.575 m)  Wt 169 lb 12.8 oz (77.021 kg)  BMI 31.05 kg/m2     Assessment & Plan:  1. LLQ abdominal pain - DG Abd 1 View; Future  2. Diverticulitis of colon -Force fluids -Take antibiotic as prescribed -RTO in 1 weeks  - ciprofloxacin (CIPRO) 500 MG tablet; Take 1 tablet (500 mg total) by mouth 2 (two) times daily.  Dispense: 14 tablet; Refill: 0 - metroNIDAZOLE (FLAGYL) 500 MG tablet; Take 1 tablet (500 mg total) by mouth 2 (two) times daily.  Dispense: 14 tablet; Refill: 0   Evelina Dun, FNP

## 2015-03-20 ENCOUNTER — Other Ambulatory Visit: Payer: Self-pay | Admitting: *Deleted

## 2015-03-20 DIAGNOSIS — R9389 Abnormal findings on diagnostic imaging of other specified body structures: Secondary | ICD-10-CM

## 2015-03-21 ENCOUNTER — Other Ambulatory Visit (INDEPENDENT_AMBULATORY_CARE_PROVIDER_SITE_OTHER): Payer: 59

## 2015-03-21 DIAGNOSIS — R938 Abnormal findings on diagnostic imaging of other specified body structures: Secondary | ICD-10-CM

## 2015-03-21 DIAGNOSIS — R9389 Abnormal findings on diagnostic imaging of other specified body structures: Secondary | ICD-10-CM

## 2015-05-23 ENCOUNTER — Encounter: Payer: Self-pay | Admitting: Nurse Practitioner

## 2015-05-23 ENCOUNTER — Ambulatory Visit (INDEPENDENT_AMBULATORY_CARE_PROVIDER_SITE_OTHER): Payer: 59 | Admitting: Nurse Practitioner

## 2015-05-23 VITALS — BP 115/74 | HR 91 | Temp 97.5°F | Ht 62.0 in | Wt 170.2 lb

## 2015-05-23 DIAGNOSIS — G4700 Insomnia, unspecified: Secondary | ICD-10-CM | POA: Diagnosis not present

## 2015-05-23 DIAGNOSIS — Z6831 Body mass index (BMI) 31.0-31.9, adult: Secondary | ICD-10-CM | POA: Diagnosis not present

## 2015-05-23 DIAGNOSIS — F32A Depression, unspecified: Secondary | ICD-10-CM

## 2015-05-23 DIAGNOSIS — E0500 Thyrotoxicosis with diffuse goiter without thyrotoxic crisis or storm: Secondary | ICD-10-CM | POA: Diagnosis not present

## 2015-05-23 DIAGNOSIS — C50912 Malignant neoplasm of unspecified site of left female breast: Secondary | ICD-10-CM | POA: Diagnosis not present

## 2015-05-23 DIAGNOSIS — G43001 Migraine without aura, not intractable, with status migrainosus: Secondary | ICD-10-CM

## 2015-05-23 DIAGNOSIS — E785 Hyperlipidemia, unspecified: Secondary | ICD-10-CM | POA: Diagnosis not present

## 2015-05-23 DIAGNOSIS — B085 Enteroviral vesicular pharyngitis: Secondary | ICD-10-CM | POA: Diagnosis not present

## 2015-05-23 DIAGNOSIS — F17200 Nicotine dependence, unspecified, uncomplicated: Secondary | ICD-10-CM

## 2015-05-23 DIAGNOSIS — J410 Simple chronic bronchitis: Secondary | ICD-10-CM | POA: Diagnosis not present

## 2015-05-23 DIAGNOSIS — Z6830 Body mass index (BMI) 30.0-30.9, adult: Secondary | ICD-10-CM | POA: Insufficient documentation

## 2015-05-23 DIAGNOSIS — Z720 Tobacco use: Secondary | ICD-10-CM | POA: Diagnosis not present

## 2015-05-23 DIAGNOSIS — F329 Major depressive disorder, single episode, unspecified: Secondary | ICD-10-CM | POA: Diagnosis not present

## 2015-05-23 MED ORDER — SIMVASTATIN 40 MG PO TABS: ORAL_TABLET | ORAL | Status: DC

## 2015-05-23 MED ORDER — ESCITALOPRAM OXALATE 10 MG PO TABS: ORAL_TABLET | ORAL | Status: DC

## 2015-05-23 MED ORDER — DOXYCYCLINE HYCLATE 100 MG PO TABS
100.0000 mg | ORAL_TABLET | Freq: Two times a day (BID) | ORAL | Status: DC
Start: 2015-05-23 — End: 2015-07-24

## 2015-05-23 MED ORDER — METHIMAZOLE 5 MG PO TABS: ORAL_TABLET | ORAL | Status: DC

## 2015-05-23 MED ORDER — SUMATRIPTAN SUCCINATE 100 MG PO TABS: 100.0000 mg | ORAL_TABLET | ORAL | Status: DC | PRN

## 2015-05-23 NOTE — Progress Notes (Signed)
Subjective:    Patient ID: Barbara Boyd, female    DOB: [birth year], 65 y.o.   MRN: [id number]\  Patient here today for follow up of chronic medical problems.  C/O cough and sore throat for 4 days- also has headache with slight nausea- OTC meds no help.  Marland KitchenHyperlipidemia This is a chronic problem. The current episode started more than 1 year ago. The problem is controlled. Recent lipid tests were reviewed and are normal. Current antihyperlipidemic treatment includes statins. The current treatment provides moderate improvement of lipids. Compliance problems include adherence to diet and adherence to exercise.  Risk factors for coronary artery disease include obesity and stress.  Breast cancer/ bil masectomy Still oin pain meds from surgery but os doing well- Has implants Graves disease Currently on methimazole- only sees [place] now for this. COPd advair bid and spirivia and is doing well- no need for albuterol in quite sometime- denies SOB and /or wheezing. Depression Lexapro- working well considering the stress she is under form cancer.   Review of Systems  Constitutional: Negative.   HENT: Negative.   Respiratory: Negative.   Cardiovascular: Negative.   Genitourinary: Negative.   Neurological: Negative.   Psychiatric/Behavioral: Negative.   All other systems reviewed and are negative.      Objective:   Physical Exam  Constitutional: She is oriented to person, place, and time. She appears well-developed and well-nourished.  HENT:  Head: Normocephalic.  Right Ear: Hearing, tympanic membrane, external ear and ear canal normal.  Left Ear: Hearing, tympanic membrane, external ear and ear canal normal.  Nose: Mucosal edema and rhinorrhea present. Right sinus exhibits no maxillary sinus tenderness and no frontal sinus tenderness. Left sinus exhibits no maxillary sinus tenderness and no frontal sinus tenderness.  Mouth/Throat: Uvula is midline. Posterior oropharyngeal erythema (mild)  present.  Eyes: Conjunctivae and EOM are normal. Pupils are equal, round, and reactive to light.  Neck: Normal range of motion and full passive range of motion without pain. Neck supple. No JVD present. Carotid bruit is not present. No thyroid mass and no thyromegaly present.  Cardiovascular: Normal rate, normal heart sounds and intact distal pulses.   No murmur heard. Pulmonary/Chest: Effort normal and breath sounds normal.  Bilateral breast implants- no breast exam done today.  Abdominal: Soft. Bowel sounds are normal. She exhibits no mass. There is no tenderness.  Genitourinary: No breast swelling, tenderness, discharge or bleeding.  Musculoskeletal: Normal range of motion.  Lymphadenopathy:    She has no cervical adenopathy.  Neurological: She is alert and oriented to person, place, and time.  Skin: Skin is warm and dry.  Psychiatric: She has a normal mood and affect. Her behavior is normal. Judgment and thought content normal.    BP 115/74 mmHg  Pulse 91  Temp(Src) 97.5 F (36.4 C) (Oral)  Ht 5' 2"  (1.575 m)  Wt 170 lb 3.2 oz (77.202 kg)  BMI 31.12 kg/m2        Assessment & Plan:   1. Simple chronic bronchitis (HCC) Avoid allergens  2. Graves disease - methimazole (TAPAZOLE) 5 MG tablet; TAKE 1/2 TABLET BY MOUTH DAILY  Dispense: 30 tablet; Refill: 5  3. Hyperlipidemia Low fat diet - simvastatin (ZOCOR) 40 MG tablet; TAKE 1 TABLET (40 MG TOTAL) BY MOUTH DAILY.  Dispense: 30 tablet; Refill: 5 - CMP14+EGFR - Lipid panel  4. Depression Stress management - escitalopram (LEXAPRO) 10 MG tablet; TAKE 1 TABLET (10 MG TOTAL) BY MOUTH DAILY.  Dispense: 30 tablet; Refill: 5  5. Insomnia Bedtime ritual  6. Smoker Smoking cessation encoiuraged  7. Invasive ductal carcinoma of breast, stage 1, left ([place])  8. BMI 31.0-31.9,adult Discussed diet and exercise for person with BMI >25 Will recheck weight in 3-6 months  9. Migraine without aura and with status migrainosus,  not intractable - SUMAtriptan (IMITREX) 100 MG tablet; Take 1 tablet (100 mg total) by mouth as needed. May repeat in 2 hours if headache persists or recurs.  Dispense: 9 tablet; Refill: 5  10. Acute enteroviral vesicular pharyngitis 1. Take meds as prescribed 2. Use a cool mist humidifier especially during the winter months and when heat has been humid. 3. Use saline nose sprays frequently 4. Saline irrigations of the nose can be very helpful if done frequently.  * 4X daily for 1 week*  * Use of a nettie pot can be helpful with this. Follow directions with this* 5. Drink plenty of fluids 6. Keep thermostat turn down low 7.For any cough or congestion  Use plain Mucinex- regular strength or max strength is fine   * Children- consult with Pharmacist for dosing 8. For fever or aces or pains- take tylenol or ibuprofen appropriate for age and weight.  * for fevers greater than 101 orally you may alternate ibuprofen and tylenol every  3 hours.    - doxycycline (VIBRA-TABS) 100 MG tablet; Take 1 tablet (100 mg total) by mouth 2 (two) times daily. 1 po bid  Dispense: 20 tablet; Refill: 0    Labs pending Health maintenance reviewed Diet and exercise encouraged Continue all meds Follow up  In 6 months   [place], FNP

## 2015-05-23 NOTE — Patient Instructions (Signed)

## 2015-05-24 LAB — LIPID PANEL
CHOL/HDL RATIO: 3.2 ratio (ref 0.0–4.4)
Cholesterol, Total: 184 mg/dL (ref 100–199)
HDL: 57 mg/dL (ref >39)
LDL CALC: 113 mg/dL — AB (ref 0–99)
Triglycerides: 68 mg/dL (ref 0–149)
VLDL CHOLESTEROL CAL: 14 mg/dL (ref 5–40)

## 2015-05-24 LAB — CMP14+EGFR
A/G RATIO: 1.9 (ref 1.1–2.5)
ALBUMIN: 4 g/dL (ref 3.6–4.8)
ALK PHOS: 82 IU/L (ref 39–117)
ALT: 15 IU/L (ref 0–32)
AST: 19 IU/L (ref 0–40)
BILIRUBIN TOTAL: 0.3 mg/dL (ref 0.0–1.2)
BUN / CREAT RATIO: 10 — AB (ref 11–26)
BUN: 6 mg/dL — ABNORMAL LOW (ref 8–27)
CO2: 25 mmol/L (ref 18–29)
Calcium: 8.7 mg/dL (ref 8.7–10.3)
Chloride: 95 mmol/L — ABNORMAL LOW (ref 96–106)
Creatinine, Ser: 0.63 mg/dL (ref 0.57–1.00)
GFR calc non Af Amer: 95 mL/min/{1.73_m2} (ref >59)
GFR, EST AFRICAN AMERICAN: 110 mL/min/{1.73_m2} (ref >59)
GLUCOSE: 99 mg/dL (ref 65–99)
Globulin, Total: 2.1 g/dL (ref 1.5–4.5)
Potassium: 4.5 mmol/L (ref 3.5–5.2)
SODIUM: 136 mmol/L (ref 134–144)
TOTAL PROTEIN: 6.1 g/dL (ref 6.0–8.5)

## 2015-07-24 ENCOUNTER — Ambulatory Visit (INDEPENDENT_AMBULATORY_CARE_PROVIDER_SITE_OTHER): Payer: 59 | Admitting: Family

## 2015-07-24 ENCOUNTER — Encounter: Payer: Self-pay | Admitting: Family

## 2015-07-24 VITALS — BP 118/70 | HR 68 | Temp 96.9°F | Ht 62.0 in | Wt 171.0 lb

## 2015-07-24 DIAGNOSIS — R32 Unspecified urinary incontinence: Secondary | ICD-10-CM

## 2015-07-24 DIAGNOSIS — N393 Stress incontinence (female) (male): Secondary | ICD-10-CM

## 2015-07-24 DIAGNOSIS — N3946 Mixed incontinence: Secondary | ICD-10-CM

## 2015-07-24 DIAGNOSIS — Z23 Encounter for immunization: Secondary | ICD-10-CM | POA: Diagnosis not present

## 2015-07-24 DIAGNOSIS — N3281 Overactive bladder: Secondary | ICD-10-CM

## 2015-07-24 LAB — MICROSCOPIC EXAMINATION
Bacteria, UA: NONE SEEN
Renal Epithel, UA: NONE SEEN /hpf

## 2015-07-24 LAB — URINALYSIS, COMPLETE
BILIRUBIN UA: NEGATIVE
GLUCOSE, UA: NEGATIVE
KETONES UA: NEGATIVE
LEUKOCYTES UA: NEGATIVE
Nitrite, UA: NEGATIVE
PROTEIN UA: NEGATIVE
SPEC GRAV UA: 1.01 (ref 1.005–1.030)
UUROB: 0.2 mg/dL (ref 0.2–1.0)
pH, UA: 7 (ref 5.0–7.5)

## 2015-07-24 MED ORDER — MIRABEGRON ER 25 MG PO TB24: 25.0000 mg | ORAL_TABLET | Freq: Every day | ORAL | Status: DC

## 2015-07-24 NOTE — Patient Instructions (Signed)
Overactive Bladder, Adult Overactive bladder is a group of urinary symptoms. With overactive bladder, you may suddenly feel the need to pass urine (urinate) right away. After feeling this sudden urge, you might also leak urine if you cannot get to the bathroom fast enough (urinary incontinence). These symptoms might interfere with your daily work or social activities. Overactive bladder symptoms may also wake you up at night. Overactive bladder affects the nerve signals between your bladder and your brain. Your bladder may get the signal to empty before it is full. Very sensitive muscles can also make your bladder squeeze too soon. CAUSES Many things can cause an overactive bladder. Possible causes include:  Urinary tract infection.  Infection of nearby tissues, such as the prostate.  Prostate enlargement.  Being pregnant with twins or more (multiples).  Surgery on the uterus or urethra.  Bladder stones, inflammation, or tumors.  Drinking too much caffeine or alcohol.  Certain medicines, especially those that you take to help your body get rid of extra fluid (diuretics) by increasing urine production.  Muscle or nerve weakness, especially from:  A spinal cord injury.  Stroke.  Multiple sclerosis.  Parkinson disease.  Diabetes. This can cause a high urine volume that fills the bladder so quickly that the normal urge to urinate is triggered very strongly.  Constipation. A buildup of too much stool can put pressure on your bladder. RISK FACTORS You may be at greater risk for overactive bladder if you:  Are an older adult.  Smoke.  Are going through menopause.  Have prostate problems.  Have a neurological disease, such as stroke, dementia, Parkinson disease, or multiple sclerosis (MS).  Eat or drink things that irritate the bladder. These include alcohol, spicy food, and caffeine.  Are overweight or obese. SIGNS AND SYMPTOMS  The signs and symptoms of an overactive  bladder include:  Sudden, strong urges to urinate.  Leaking urine.  Urinating eight or more times per day.  Waking up to urinate two or more times per night. DIAGNOSIS Your health care provider may suspect overactive bladder based on your symptoms. The health care provider will do a physical exam and take your medical history. Blood or urine tests may also be done. For example, you might need to have a bladder function test to check how well you can hold your urine. You might also need to see a health care provider who specializes in the urinary tract (urologist). TREATMENT Treatment for overactive bladder depends on the cause of your condition and whether it is mild or severe. Certain treatments can be done in your health care provider's office or clinic. You can also make lifestyle changes at home. Options include: Behavioral Treatments  Biofeedback. A specialist uses sensors to help you become aware of your body's signals.  Keeping a daily log of when you need to urinate and what happens after the urge. This may help you manage your condition.  Bladder training. This helps you learn to control the urge to urinate by following a schedule that directs you to urinate at regular intervals (timed voiding). At first, you might have to wait a few minutes after feeling the urge. In time, you should be able to schedule bathroom visits an hour or more apart.  Kegel exercises. These are exercises to strengthen the pelvic floor muscles, which support the bladder. Toning these muscles can help you control urination, even if your bladder muscles are overactive. A specialist will teach you how to do these exercises correctly. They   require daily practice.  Weight loss. If you are obese or overweight, losing weight might relieve your symptoms of overactive bladder. Talk to your health care provider about losing weight and whether there is a specific program or method that would work best for you.  Diet  change. This might help if constipation is making your overactive bladder worse. Your health care provider or a dietitian can explain ways to change what you eat to ease constipation. You might also need to consume less alcohol and caffeine or drink other fluids at different times of the day.  Stopping smoking.  Wearing pads to absorb leakage while you wait for other treatments to take effect. Physical Treatments  Electrical stimulation. Electrodes send gentle pulses of electricity to strengthen the nerves or muscles that help to control the bladder. Sometimes, the electrodes are placed outside of the body. In other cases, they might be placed inside the body (implanted). This treatment can take several months to have an effect.  Supportive devices. Women may need a plastic device that fits into the vagina and supports the bladder (pessary). Medicines Several medicines can help treat overactive bladder and are usually used along with other treatments. Some are injected into the muscles involved in urination. Others come in pill form. Your health care provider may prescribe:  Antispasmodics. These medicines block the signals that the nerves send to the bladder. This keeps the bladder from releasing urine at the wrong time.  Tricyclic antidepressants. These types of antidepressants also relax bladder muscles. Surgery  You may have a device implanted to help manage the nerve signals that indicate when you need to urinate.  You may have surgery to implant electrodes for electrical stimulation.  Sometimes, very severe cases of overactive bladder require surgery to change the shape of the bladder. HOME CARE INSTRUCTIONS   Take medicines only as directed by your health care provider.  Use any implants or a pessary as directed by your health care provider.  Make any diet or lifestyle changes that are recommended by your health care provider. These might include:  Drinking less fluid or  drinking at different times of the day. If you need to urinate often during the night, you may need to stop drinking fluids early in the evening.  Cutting down on caffeine or alcohol. Both can make an overactive bladder worse. Caffeine is found in coffee, tea, and sodas.  Doing Kegel exercises to strengthen muscles.  Losing weight if you need to.  Eating a healthy and balanced diet to prevent constipation.  Keep a journal or log to track how much and when you drink and also when you feel the need to urinate. This will help your health care provider to monitor your condition. SEEK MEDICAL CARE IF:  Your symptoms do not get better after treatment.  Your pain and discomfort are getting worse.  You have more frequent urges to urinate.  You have a fever. SEEK IMMEDIATE MEDICAL CARE IF: You are not able to control your bladder at all.   This information is not intended to replace advice given to you by your health care provider. Make sure you discuss any questions you have with your health care provider.   Document Released: 03/02/2009 Document Revised: 05/27/2014 Document Reviewed: 09/29/2013 Elsevier Interactive Patient Education 2016 Elsevier Inc.  

## 2015-07-24 NOTE — Progress Notes (Signed)
   Subjective:    Patient ID: Barbara Boyd, female    DOB: [birth year], 65 y.o.   MRN: [id number]  Pt presents to the office today for urinary incontinence that has started over the last months and seems to be worse. Pt states she has always has had stress incontinence, but nothing like this.  Urinary Tract Infection  This is a new problem. The current episode started more than 1 month ago. The problem occurs intermittently. The problem has been unchanged. The pain is at a severity of 0/10. The patient is experiencing no pain. Associated symptoms include frequency and urgency. Pertinent negatives include no discharge, hematuria, hesitancy, nausea or vomiting. She has tried nothing for the symptoms. The treatment provided no relief.      Review of Systems  Constitutional: Negative.   HENT: Negative.   Eyes: Negative.   Respiratory: Negative.  Negative for shortness of breath.   Cardiovascular: Negative.  Negative for palpitations.  Gastrointestinal: Negative.  Negative for nausea and vomiting.  Endocrine: Negative.   Genitourinary: Positive for urgency and frequency. Negative for hesitancy and hematuria.  Musculoskeletal: Negative.   Neurological: Negative.  Negative for headaches.  Hematological: Negative.   Psychiatric/Behavioral: Negative.   All other systems reviewed and are negative.      Objective:   Physical Exam  Constitutional: She is oriented to person, place, and time. She appears well-developed and well-nourished. No distress.  HENT:  Head: Normocephalic and atraumatic.  Eyes: Pupils are equal, round, and reactive to light.  Neck: Normal range of motion. Neck supple. No thyromegaly present.  Cardiovascular: Normal rate, regular rhythm, normal heart sounds and intact distal pulses.   No murmur heard. Pulmonary/Chest: Effort normal and breath sounds normal. No respiratory distress. She has no wheezes.  Abdominal: Soft. Bowel sounds are normal. She exhibits no  distension. There is no tenderness.  Musculoskeletal: Normal range of motion. She exhibits no edema or tenderness.  Negative for CVA tenderness   Neurological: She is alert and oriented to person, place, and time. She has normal reflexes. No cranial nerve deficit.  Skin: Skin is warm and dry.  Psychiatric: She has a normal mood and affect. Her behavior is normal. Judgment and thought content normal.  Vitals reviewed.   BP 118/70 mmHg  Pulse 68  Temp(Src) 96.9 F (36.1 C) (Oral)  Ht 5\' 2"  (1.575 m)  Wt 171 lb (77.565 kg)  BMI 31.27 kg/m2       Assessment & Plan:  1. Incontinence in female - Urinalysis, Complete  2. OAB (overactive bladder) - mirabegron ER (MYRBETRIQ) 25 MG TB24 tablet; Take 1 tablet (25 mg total) by mouth daily.  Dispense: 90 tablet; Refill: 1  3. Urge and stress incontinence - mirabegron ER (MYRBETRIQ) 25 MG TB24 tablet; Take 1 tablet (25 mg total) by mouth daily.  Dispense: 90 tablet; Refill: 1  PT started on myrbetriq 25 mg today Avoid caffeine  Kegel exercises encouraged Smoking cessation discussed RTO in 1 month  Evelina Dun, FNP

## 2015-07-24 NOTE — Addendum Note (Signed)
Addended by: Shelbie Ammons on: 07/24/2015 12:32 PM   Modules accepted: Orders, SmartSet

## 2015-10-23 DIAGNOSIS — Z9013 Acquired absence of bilateral breasts and nipples: Secondary | ICD-10-CM | POA: Diagnosis not present

## 2015-11-14 DIAGNOSIS — C50919 Malignant neoplasm of unspecified site of unspecified female breast: Secondary | ICD-10-CM | POA: Diagnosis not present

## 2015-11-14 DIAGNOSIS — Z08 Encounter for follow-up examination after completed treatment for malignant neoplasm: Secondary | ICD-10-CM | POA: Diagnosis not present

## 2015-11-14 DIAGNOSIS — F17200 Nicotine dependence, unspecified, uncomplicated: Secondary | ICD-10-CM | POA: Diagnosis not present

## 2015-11-14 DIAGNOSIS — C50412 Malignant neoplasm of upper-outer quadrant of left female breast: Secondary | ICD-10-CM | POA: Diagnosis not present

## 2015-11-14 DIAGNOSIS — Z803 Family history of malignant neoplasm of breast: Secondary | ICD-10-CM | POA: Diagnosis not present

## 2015-11-14 DIAGNOSIS — Z9013 Acquired absence of bilateral breasts and nipples: Secondary | ICD-10-CM | POA: Diagnosis not present

## 2015-11-14 DIAGNOSIS — F17210 Nicotine dependence, cigarettes, uncomplicated: Secondary | ICD-10-CM | POA: Diagnosis not present

## 2015-11-14 DIAGNOSIS — Z9882 Breast implant status: Secondary | ICD-10-CM | POA: Diagnosis not present

## 2015-11-14 DIAGNOSIS — Z853 Personal history of malignant neoplasm of breast: Secondary | ICD-10-CM | POA: Diagnosis not present

## 2015-11-23 ENCOUNTER — Ambulatory Visit (INDEPENDENT_AMBULATORY_CARE_PROVIDER_SITE_OTHER): Payer: Medicare Other | Admitting: Nurse Practitioner

## 2015-11-23 ENCOUNTER — Encounter: Payer: Self-pay | Admitting: Nurse Practitioner

## 2015-11-23 VITALS — BP 114/64 | HR 73 | Temp 96.6°F | Ht 62.4 in | Wt 170.6 lb

## 2015-11-23 DIAGNOSIS — E0500 Thyrotoxicosis with diffuse goiter without thyrotoxic crisis or storm: Secondary | ICD-10-CM

## 2015-11-23 DIAGNOSIS — M25511 Pain in right shoulder: Secondary | ICD-10-CM | POA: Diagnosis not present

## 2015-11-23 DIAGNOSIS — G4700 Insomnia, unspecified: Secondary | ICD-10-CM | POA: Diagnosis not present

## 2015-11-23 DIAGNOSIS — N3946 Mixed incontinence: Secondary | ICD-10-CM | POA: Diagnosis not present

## 2015-11-23 DIAGNOSIS — Z8639 Personal history of other endocrine, nutritional and metabolic disease: Secondary | ICD-10-CM | POA: Diagnosis not present

## 2015-11-23 DIAGNOSIS — F329 Major depressive disorder, single episode, unspecified: Secondary | ICD-10-CM | POA: Diagnosis not present

## 2015-11-23 DIAGNOSIS — N3281 Overactive bladder: Secondary | ICD-10-CM

## 2015-11-23 DIAGNOSIS — E785 Hyperlipidemia, unspecified: Secondary | ICD-10-CM | POA: Diagnosis not present

## 2015-11-23 DIAGNOSIS — G43001 Migraine without aura, not intractable, with status migrainosus: Secondary | ICD-10-CM | POA: Diagnosis not present

## 2015-11-23 DIAGNOSIS — F32A Depression, unspecified: Secondary | ICD-10-CM

## 2015-11-23 DIAGNOSIS — F17200 Nicotine dependence, unspecified, uncomplicated: Secondary | ICD-10-CM

## 2015-11-23 DIAGNOSIS — C50912 Malignant neoplasm of unspecified site of left female breast: Secondary | ICD-10-CM

## 2015-11-23 DIAGNOSIS — Z720 Tobacco use: Secondary | ICD-10-CM

## 2015-11-23 DIAGNOSIS — E559 Vitamin D deficiency, unspecified: Secondary | ICD-10-CM | POA: Diagnosis not present

## 2015-11-23 DIAGNOSIS — J410 Simple chronic bronchitis: Secondary | ICD-10-CM | POA: Diagnosis not present

## 2015-11-23 MED ORDER — METHIMAZOLE 5 MG PO TABS: ORAL_TABLET | ORAL | Status: DC

## 2015-11-23 MED ORDER — TIOTROPIUM BROMIDE MONOHYDRATE 18 MCG IN CAPS: 18.0000 ug | ORAL_CAPSULE | Freq: Every day | RESPIRATORY_TRACT | Status: DC

## 2015-11-23 MED ORDER — MIRABEGRON ER 25 MG PO TB24: 25.0000 mg | ORAL_TABLET | Freq: Every day | ORAL | Status: DC

## 2015-11-23 MED ORDER — BUPIVACAINE HCL 0.25 % IJ SOLN
1.0000 mL | Freq: Once | INTRAMUSCULAR | Status: AC
  Administered 2015-11-23: 1 mL

## 2015-11-23 MED ORDER — METHYLPREDNISOLONE ACETATE 80 MG/ML IJ SUSP
40.0000 mg | Freq: Once | INTRAMUSCULAR | Status: AC
  Administered 2015-11-23: 40 mg via INTRA_ARTICULAR

## 2015-11-23 MED ORDER — FLUTICASONE-SALMETEROL 250-50 MCG/DOSE IN AEPB: 1.0000 | INHALATION_SPRAY | Freq: Two times a day (BID) | RESPIRATORY_TRACT | Status: DC

## 2015-11-23 MED ORDER — SIMVASTATIN 40 MG PO TABS: ORAL_TABLET | ORAL | Status: DC

## 2015-11-23 MED ORDER — ESCITALOPRAM OXALATE 10 MG PO TABS: ORAL_TABLET | ORAL | Status: DC

## 2015-11-23 MED ORDER — SUMATRIPTAN SUCCINATE 100 MG PO TABS
100.0000 mg | ORAL_TABLET | ORAL | Status: DC | PRN
Start: 2015-11-23 — End: 2016-05-27

## 2015-11-23 NOTE — Patient Instructions (Signed)
Overactive Bladder, Adult Overactive bladder is a group of urinary symptoms. With overactive bladder, you may suddenly feel the need to pass urine (urinate) right away. After feeling this sudden urge, you might also leak urine if you cannot get to the bathroom fast enough (urinary incontinence). These symptoms might interfere with your daily work or social activities. Overactive bladder symptoms may also wake you up at night. Overactive bladder affects the nerve signals between your bladder and your brain. Your bladder may get the signal to empty before it is full. Very sensitive muscles can also make your bladder squeeze too soon. CAUSES Many things can cause an overactive bladder. Possible causes include:  Urinary tract infection.  Infection of nearby tissues, such as the prostate.  Prostate enlargement.  Being pregnant with twins or more (multiples).  Surgery on the uterus or urethra.  Bladder stones, inflammation, or tumors.  Drinking too much caffeine or alcohol.  Certain medicines, especially those that you take to help your body get rid of extra fluid (diuretics) by increasing urine production.  Muscle or nerve weakness, especially from:  A spinal cord injury.  Stroke.  Multiple sclerosis.  Parkinson disease.  Diabetes. This can cause a high urine volume that fills the bladder so quickly that the normal urge to urinate is triggered very strongly.  Constipation. A buildup of too much stool can put pressure on your bladder. RISK FACTORS You may be at greater risk for overactive bladder if you:  Are an older adult.  Smoke.  Are going through menopause.  Have prostate problems.  Have a neurological disease, such as stroke, dementia, Parkinson disease, or multiple sclerosis (MS).  Eat or drink things that irritate the bladder. These include alcohol, spicy food, and caffeine.  Are overweight or obese. SIGNS AND SYMPTOMS  The signs and symptoms of an overactive  bladder include:  Sudden, strong urges to urinate.  Leaking urine.  Urinating eight or more times per day.  Waking up to urinate two or more times per night. DIAGNOSIS Your health care provider may suspect overactive bladder based on your symptoms. The health care provider will do a physical exam and take your medical history. Blood or urine tests may also be done. For example, you might need to have a bladder function test to check how well you can hold your urine. You might also need to see a health care provider who specializes in the urinary tract (urologist). TREATMENT Treatment for overactive bladder depends on the cause of your condition and whether it is mild or severe. Certain treatments can be done in your health care provider's office or clinic. You can also make lifestyle changes at home. Options include: Behavioral Treatments  Biofeedback. A specialist uses sensors to help you become aware of your body's signals.  Keeping a daily log of when you need to urinate and what happens after the urge. This may help you manage your condition.  Bladder training. This helps you learn to control the urge to urinate by following a schedule that directs you to urinate at regular intervals (timed voiding). At first, you might have to wait a few minutes after feeling the urge. In time, you should be able to schedule bathroom visits an hour or more apart.  Kegel exercises. These are exercises to strengthen the pelvic floor muscles, which support the bladder. Toning these muscles can help you control urination, even if your bladder muscles are overactive. A specialist will teach you how to do these exercises correctly. They   require daily practice.  Weight loss. If you are obese or overweight, losing weight might relieve your symptoms of overactive bladder. Talk to your health care provider about losing weight and whether there is a specific program or method that would work best for you.  Diet  change. This might help if constipation is making your overactive bladder worse. Your health care provider or a dietitian can explain ways to change what you eat to ease constipation. You might also need to consume less alcohol and caffeine or drink other fluids at different times of the day.  Stopping smoking.  Wearing pads to absorb leakage while you wait for other treatments to take effect. Physical Treatments  Electrical stimulation. Electrodes send gentle pulses of electricity to strengthen the nerves or muscles that help to control the bladder. Sometimes, the electrodes are placed outside of the body. In other cases, they might be placed inside the body (implanted). This treatment can take several months to have an effect.  Supportive devices. Women may need a plastic device that fits into the vagina and supports the bladder (pessary). Medicines Several medicines can help treat overactive bladder and are usually used along with other treatments. Some are injected into the muscles involved in urination. Others come in pill form. Your health care provider may prescribe:  Antispasmodics. These medicines block the signals that the nerves send to the bladder. This keeps the bladder from releasing urine at the wrong time.  Tricyclic antidepressants. These types of antidepressants also relax bladder muscles. Surgery  You may have a device implanted to help manage the nerve signals that indicate when you need to urinate.  You may have surgery to implant electrodes for electrical stimulation.  Sometimes, very severe cases of overactive bladder require surgery to change the shape of the bladder. HOME CARE INSTRUCTIONS   Take medicines only as directed by your health care provider.  Use any implants or a pessary as directed by your health care provider.  Make any diet or lifestyle changes that are recommended by your health care provider. These might include:  Drinking less fluid or  drinking at different times of the day. If you need to urinate often during the night, you may need to stop drinking fluids early in the evening.  Cutting down on caffeine or alcohol. Both can make an overactive bladder worse. Caffeine is found in coffee, tea, and sodas.  Doing Kegel exercises to strengthen muscles.  Losing weight if you need to.  Eating a healthy and balanced diet to prevent constipation.  Keep a journal or log to track how much and when you drink and also when you feel the need to urinate. This will help your health care provider to monitor your condition. SEEK MEDICAL CARE IF:  Your symptoms do not get better after treatment.  Your pain and discomfort are getting worse.  You have more frequent urges to urinate.  You have a fever. SEEK IMMEDIATE MEDICAL CARE IF: You are not able to control your bladder at all.   This information is not intended to replace advice given to you by your health care provider. Make sure you discuss any questions you have with your health care provider.   Document Released: 03/02/2009 Document Revised: 05/27/2014 Document Reviewed: 09/29/2013 Elsevier Interactive Patient Education 2016 Elsevier Inc.  

## 2015-11-23 NOTE — Addendum Note (Signed)
Addended by: Chevis Pretty on: 11/23/2015 10:38 AM   Modules accepted: Orders

## 2015-11-23 NOTE — Progress Notes (Addendum)
Subjective:    Patient ID: Barbara Boyd, female    DOB: [birth year], 65 y.o.   MRN: [id number]\  Patient here today for follow up of chronic medical problems.  Outpatient Encounter Prescriptions as of 11/23/2015  Medication Sig  . escitalopram (LEXAPRO) 10 MG tablet TAKE 1 TABLET (10 MG TOTAL) BY MOUTH DAILY.  Marland Kitchen Fluticasone-Salmeterol (ADVAIR) 250-50 MCG/DOSE AEPB Inhale 1 puff into the lungs every 12 (twelve) hours.  . methimazole (TAPAZOLE) 5 MG tablet TAKE 1/2 TABLET BY MOUTH DAILY  . mirabegron ER (MYRBETRIQ) 25 MG TB24 tablet Take 1 tablet (25 mg total) by mouth daily.  . simvastatin (ZOCOR) 40 MG tablet TAKE 1 TABLET (40 MG TOTAL) BY MOUTH DAILY.  . SUMAtriptan (IMITREX) 100 MG tablet Take 1 tablet (100 mg total) by mouth as needed. May repeat in 2 hours if headache persists or recurs.  Marland Kitchen tiotropium (SPIRIVA HANDIHALER) 18 MCG inhalation capsule Place 1 capsule (18 mcg total) into inhaler and inhale daily.  Marland Kitchen zolpidem (AMBIEN) 5 MG tablet Take 1 tablet (5 mg total) by mouth at bedtime as needed for sleep. (Patient not taking: Reported on 11/23/2015)   No facility-administered encounter medications on file as of 11/23/2015.   * C/O right shoulder pain-started a few years ago and had shoulder injection which cleared it up until 2 months ago- gradually getting worse- rate [place] 5/10- movement increases paoin as well as laying on that side.  .Hyperlipidemia This is a chronic problem. The current episode started more than 1 year ago. The problem is controlled. Recent lipid tests were reviewed and are normal. Current antihyperlipidemic treatment includes statins. The current treatment provides moderate improvement of lipids. Compliance problems include adherence to diet and adherence to exercise.  Risk factors for coronary artery disease include obesity and stress.  Breast cancer/ bil masectomy Still oin pain meds from surgery but os doing well- Has implants Graves disease Currently on  methimazole- only sees [place] now for this. COPd advair bid and spirivia and is doing well- no need for albuterol in quite sometime- denies SOB and /or wheezing. Depression Lexapro- working well considering the stress she is under form cancer. migraines Some weeks she has no migraines then some weeks she will have 2- imitrex works well to relieve migraine. insomnia Has tried [place] in past and she still woke up- she recently started on melatonin which is helping- she does wake up to go to the bathroom but is able to fll back to sleep.    Review of Systems  Constitutional: Negative.   HENT: Negative.   Respiratory: Negative.   Cardiovascular: Negative.   Genitourinary: Negative.   Neurological: Negative.   Psychiatric/Behavioral: Negative.   All other systems reviewed and are negative.      Objective:   Physical Exam  Constitutional: She is oriented to person, place, and time. She appears well-developed and well-nourished.  HENT:  Head: Normocephalic.  Right Ear: Hearing, tympanic membrane, external ear and ear canal normal.  Left Ear: Hearing, tympanic membrane, external ear and ear canal normal.  Nose: Mucosal edema and rhinorrhea present. Right sinus exhibits no maxillary sinus tenderness and no frontal sinus tenderness. Left sinus exhibits no maxillary sinus tenderness and no frontal sinus tenderness.  Mouth/Throat: Uvula is midline. Posterior oropharyngeal erythema (mild) present.  Eyes: Conjunctivae and EOM are normal. Pupils are equal, round, and reactive to light.  Neck: Normal range of motion and full passive range of motion without pain. Neck supple. No JVD present. Carotid bruit  is not present. No thyroid mass and no thyromegaly present.  Cardiovascular: Normal rate, normal heart sounds and intact distal pulses.   No murmur heard. Pulmonary/Chest: Effort normal and breath sounds normal.  Bilateral breast implants- no breast exam done today.  Abdominal: Soft. Bowel sounds  are normal. She exhibits no mass. There is no tenderness.  Genitourinary: No breast swelling, tenderness, discharge or bleeding.  Musculoskeletal: Normal range of motion.  Pain on palpation of right shoulder . FROM with pain  On abduction and internal rotation Grips equal Motor strength and sensation distally intact  Lymphadenopathy:    She has no cervical adenopathy.  Neurological: She is alert and oriented to person, place, and time.  Skin: Skin is warm and dry.  Psychiatric: She has a normal mood and affect. Her behavior is normal. Judgment and thought content normal.    BP 114/64 mmHg  Pulse 73  Temp(Src) 96.6 F (35.9 C) (Oral)  Ht 5' 2.4" (1.585 m)  Wt 170 lb 9.6 oz (77.384 kg)  BMI 30.80 kg/m2  Left shoulder injection with 22g needle- cleaned with betadine prior- patient tolerated well.      Assessment & Plan:   1. Simple chronic bronchitis (HCC) Continue inhalers - Fluticasone-Salmeterol (ADVAIR) 250-50 MCG/DOSE AEPB; Inhale 1 puff into the lungs every 12 (twelve) hours.  Dispense: 60 each; Refill: 5 - tiotropium (SPIRIVA HANDIHALER) 18 MCG inhalation capsule; Place 1 capsule (18 mcg total) into inhaler and inhale daily.  Dispense: 30 capsule; Refill: 5  2. Migraine without aura and with status migrainosus, not intractable - SUMAtriptan (IMITREX) 100 MG tablet; Take 1 tablet (100 mg total) by mouth as needed. May repeat in 2 hours if headache persists or recurs.  Dispense: 9 tablet; Refill: 5  3. Graves disease - methimazole (TAPAZOLE) 5 MG tablet; TAKE 1/2 TABLET BY MOUTH DAILY  Dispense: 30 tablet; Refill: 5 - Thyroid Panel With TSH  4. Hyperlipidemia Low fat diet - simvastatin (ZOCOR) 40 MG tablet; TAKE 1 TABLET (40 MG TOTAL) BY MOUTH DAILY.  Dispense: 30 tablet; Refill: 5 - CMP14+EGFR - Lipid panel  5. Invasive ductal carcinoma of breast, stage 1, left (HCC) Still sees oncologist yearly  6. Depression Stress management - escitalopram (LEXAPRO) 10 MG  tablet; TAKE 1 TABLET (10 MG TOTAL) BY MOUTH DAILY.  Dispense: 30 tablet; Refill: 5  7. Smoker Smoking cessation  8. Insomnia Bedtime ritual Continue melatonin OTC  9. OAB (overactive bladder) Do not drink 2 hours prior to bedtime - mirabegron ER (MYRBETRIQ) 25 MG TB24 tablet; Take 1 tablet (25 mg total) by mouth daily.  Dispense: 90 tablet; Refill: 1  10. Urge and stress incontinence - mirabegron ER (MYRBETRIQ) 25 MG TB24 tablet; Take 1 tablet (25 mg total) by mouth daily.  Dispense: 90 tablet; Refill: 1  11. H/O vitamin D deficiency - VITAMIN D 25 Hydroxy (Vit-D Deficiency, Fractures)  12. Right shoulder pain - joint injection  Labs pending Health maintenance reviewed Diet and exercise encouraged Continue all meds Follow up  In 6 month   [place], FNP

## 2015-11-24 LAB — CMP14+EGFR
ALBUMIN: 4.1 g/dL (ref 3.6–4.8)
ALK PHOS: 79 IU/L (ref 39–117)
ALT: 11 IU/L (ref 0–32)
AST: 17 IU/L (ref 0–40)
Albumin/Globulin Ratio: 1.8 (ref 1.2–2.2)
BUN / CREAT RATIO: 9 — AB (ref 12–28)
BUN: 6 mg/dL — AB (ref 8–27)
Bilirubin Total: 0.4 mg/dL (ref 0.0–1.2)
CALCIUM: 9.2 mg/dL (ref 8.7–10.3)
CO2: 23 mmol/L (ref 18–29)
CREATININE: 0.67 mg/dL (ref 0.57–1.00)
Chloride: 97 mmol/L (ref 96–106)
GFR calc Af Amer: 107 mL/min/{1.73_m2} (ref >59)
GFR, EST NON AFRICAN AMERICAN: 93 mL/min/{1.73_m2} (ref >59)
GLOBULIN, TOTAL: 2.3 g/dL (ref 1.5–4.5)
GLUCOSE: 86 mg/dL (ref 65–99)
Potassium: 4.5 mmol/L (ref 3.5–5.2)
Sodium: 139 mmol/L (ref 134–144)
TOTAL PROTEIN: 6.4 g/dL (ref 6.0–8.5)

## 2015-11-24 LAB — LIPID PANEL
CHOL/HDL RATIO: 2.5 ratio (ref 0.0–4.4)
CHOLESTEROL TOTAL: 145 mg/dL (ref 100–199)
HDL: 59 mg/dL (ref >39)
LDL CALC: 74 mg/dL (ref 0–99)
Triglycerides: 60 mg/dL (ref 0–149)
VLDL CHOLESTEROL CAL: 12 mg/dL (ref 5–40)

## 2015-11-24 LAB — THYROID PANEL WITH TSH
Free Thyroxine Index: 1.7 (ref 1.2–4.9)
T3 Uptake Ratio: 25 % (ref 24–39)
T4 TOTAL: 6.9 ug/dL (ref 4.5–12.0)
TSH: 2.39 u[IU]/mL (ref 0.450–4.500)

## 2015-11-24 LAB — VITAMIN D 25 HYDROXY (VIT D DEFICIENCY, FRACTURES): Vit D, 25-Hydroxy: 20.4 ng/mL — ABNORMAL LOW (ref 30.0–100.0)

## 2016-01-16 ENCOUNTER — Ambulatory Visit (INDEPENDENT_AMBULATORY_CARE_PROVIDER_SITE_OTHER): Payer: Medicare Other | Admitting: Nurse Practitioner

## 2016-01-16 ENCOUNTER — Ambulatory Visit (INDEPENDENT_AMBULATORY_CARE_PROVIDER_SITE_OTHER): Payer: Medicare Other

## 2016-01-16 ENCOUNTER — Encounter: Payer: Self-pay | Admitting: Nurse Practitioner

## 2016-01-16 VITALS — BP 120/61 | HR 70 | Temp 96.4°F | Ht 62.0 in | Wt 176.0 lb

## 2016-01-16 DIAGNOSIS — M25552 Pain in left hip: Secondary | ICD-10-CM

## 2016-01-16 DIAGNOSIS — R109 Unspecified abdominal pain: Secondary | ICD-10-CM

## 2016-01-16 NOTE — Patient Instructions (Signed)
Flank Pain °Flank pain refers to pain that is located on the side of the body between the upper abdomen and the back. The pain may occur over a short period of time (acute) or may be long-term or reoccurring (chronic). It may be mild or severe. Flank pain can be caused by many things. °CAUSES  °Some of the more common causes of flank pain include: °· Muscle strains.   °· Muscle spasms.   °· A disease of your spine (vertebral disk disease).   °· A lung infection (pneumonia).   °· Fluid around your lungs (pulmonary edema).   °· A kidney infection.   °· Kidney stones.   °· A very painful skin rash caused by the chickenpox virus (shingles).   °· Gallbladder disease.   °HOME CARE INSTRUCTIONS  °Home care will depend on the cause of your pain. In general, °· Rest as directed by your caregiver. °· Drink enough fluids to keep your urine clear or pale yellow. °· Only take over-the-counter or prescription medicines as directed by your caregiver. Some medicines may help relieve the pain. °· Tell your caregiver about any changes in your pain. °· Follow up with your caregiver as directed. °SEEK IMMEDIATE MEDICAL CARE IF:  °· Your pain is not controlled with medicine.   °· You have new or worsening symptoms. °· Your pain increases.   °· You have abdominal pain.   °· You have shortness of breath.   °· You have persistent nausea or vomiting.   °· You have swelling in your abdomen.   °· You feel faint or pass out.   °· You have blood in your urine. °· You have a fever or persistent symptoms for more than 2-3 days. °· You have a fever and your symptoms suddenly get worse. °MAKE SURE YOU:  °· Understand these instructions. °· Will watch your condition. °· Will get help right away if you are not doing well or get worse. °  °This information is not intended to replace advice given to you by your health care provider. Make sure you discuss any questions you have with your health care provider. °  °Document Released: 06/27/2005 Document  Revised: 01/29/2012 Document Reviewed: 12/19/2011 °Elsevier Interactive Patient Education ©2016 Elsevier Inc. ° °

## 2016-01-16 NOTE — Progress Notes (Signed)
   Subjective:    Patient ID: Barbara Boyd, female    DOB: 09-26-1950, 65 y.o.   MRN: [id number]  HPI Patient comes in today c/o left flank pain- has had utra sound which was negative. This has been going on for a couple of years intermittently. Here lately pain has increased and is coming more frequently. Yesterday she had an attack in [place] and could hardly walk to get out of store. Now her left hip is starting to hurt. Not sure if two are related. She did have a fall the last week of July and landed on her butt when she fell off of a ladder. Hip really started hurting intermittently a few weeks ago. Pain is sharp and shots through hip down left leg. Pain only lasts for a few seconds. Happens at least 1x per day.    Review of Systems  Constitutional: Negative.   HENT: Negative.   Respiratory: Negative.   Cardiovascular: Negative.   Gastrointestinal: Negative.   Musculoskeletal: Positive for gait problem (only when having hip pain).  Psychiatric/Behavioral: Negative.   All other systems reviewed and are negative.      Objective:   Physical Exam  Constitutional: She is oriented to person, place, and time. She appears well-developed and well-nourished.  Cardiovascular: Normal rate.   Pulmonary/Chest: Effort normal and breath sounds normal.  Abdominal: Soft. Bowel sounds are normal. There is tenderness (mild tenderness left upper quadrant on deep palpation.).  Musculoskeletal:  Gait normal FROM of left hip with slight pain on external rotation and full abduction.  Neurological: She is alert and oriented to person, place, and time.  Skin: Skin is warm.  Psychiatric: She has a normal mood and affect. Her behavior is normal. Judgment and thought content normal.   BP 120/61   Pulse 70   Temp (!) 96.4 F (35.8 C) (Oral)   Ht 5\' 2"  (1.575 m)   Wt 176 lb (79.8 kg)   BMI 32.19 kg/m   Left hip xray- mild degenerative changes- Preliminary reading by Ronnald Collum, FNP  [facility]        Assessment & Plan:  1. Left hip pain Rest extra strength tylenol - DG HIP UNILAT W OR W/O PELVIS 2-3 VIEWS LEFT; Future  2. Left flank pain Will wait on CT report - CT Abdomen Pelvis W Contrast; Future   [name], FNP

## 2016-01-20 ENCOUNTER — Ambulatory Visit (INDEPENDENT_AMBULATORY_CARE_PROVIDER_SITE_OTHER): Payer: Medicare Other | Admitting: Family Medicine

## 2016-01-20 VITALS — BP 122/75 | HR 81 | Temp 97.6°F | Wt 168.6 lb

## 2016-01-20 DIAGNOSIS — R1032 Left lower quadrant pain: Secondary | ICD-10-CM

## 2016-01-20 DIAGNOSIS — R300 Dysuria: Secondary | ICD-10-CM | POA: Diagnosis not present

## 2016-01-20 DIAGNOSIS — N390 Urinary tract infection, site not specified: Secondary | ICD-10-CM | POA: Diagnosis not present

## 2016-01-20 LAB — URINALYSIS
BILIRUBIN UA: NEGATIVE
GLUCOSE, UA: NEGATIVE
KETONES UA: NEGATIVE
NITRITE UA: POSITIVE — AB
SPEC GRAV UA: 1.03 (ref 1.005–1.030)
UUROB: 0.2 mg/dL (ref 0.2–1.0)
pH, UA: 7 (ref 5.0–7.5)

## 2016-01-20 MED ORDER — METRONIDAZOLE 500 MG PO TABS: 500.0000 mg | ORAL_TABLET | Freq: Three times a day (TID) | ORAL | 0 refills | Status: DC

## 2016-01-20 MED ORDER — CIPROFLOXACIN HCL 500 MG PO TABS: 500.0000 mg | ORAL_TABLET | Freq: Two times a day (BID) | ORAL | 0 refills | Status: DC

## 2016-01-20 NOTE — Progress Notes (Signed)
   HPI  Patient presents today here with concern for UTI.  Patient reports about 2 days of symptoms including severe dysuria and polyuria especially noticeable at night. No fevers, chills, sweats, back pain. She states her urine has been slightly darker in color as well.  She has persistent left lower quadrant/left-sided pelvic pain which has been stable for about one year. She's recently been evaluated for this as well. She has history of colonoscopy with polyp and diverticulosis.  She is eating and drinking normally.  PMH: Smoking status noted ROS: Per HPI  Objective: BP 122/75 (BP Location: Right Arm, Patient Position: Sitting, Cuff Size: Normal)   Pulse 81   Temp 97.6 F (36.4 C) (Oral)   Wt 168 lb 9.6 oz (76.5 kg)   BMI 30.84 kg/m  Gen: NAD, alert, cooperative with exam HEENT: NCAT CV: RRR, good S1/S2, no murmur Resp: CTABL, no wheezes, non-labored Abd: Soft, no guarding, mild tenderness to palpation in the left lower quadrant as well as the suprapubic area over the bladder, positive bowel sounds Ext: No edema, warm Neuro: Alert and oriented, No gross deficits  Assessment and plan:  # UTI Treat with Cipro, considering chronic left lower quadrant pain as well as history of mild diverticulosis on colonoscopy and covering with Flagyl as well in the remote chance that she has smoldering diverticulitis.  Discussed this with patient, recommended taking probiotics twice daily while on this medication.  Sent for urine culture. Return to clinic with any concerns  # LLQ pain Unclear etiology, however patient has history of moderate diverticulosis on colonoscopy of December 2015, covering for diverticulitis today as well as UTI. UTI is very apparent according to the urinalysis.   Orders Placed This Encounter  Procedures  . Urine culture  . Urinalysis    Meds ordered this encounter  Medications  . ciprofloxacin (CIPRO) 500 MG tablet    Sig: Take 1 tablet (500 mg  total) by mouth 2 (two) times daily.    Dispense:  14 tablet    Refill:  0  . metroNIDAZOLE (FLAGYL) 500 MG tablet    Sig: Take 1 tablet (500 mg total) by mouth 3 (three) times daily.    Dispense:  21 tablet    Refill:  0    Laroy Apple, MD [facility] 01/20/2016, 10:18 AM

## 2016-01-20 NOTE — Patient Instructions (Signed)
Great to meet you!  I have sent in antibiotics like we discussed, please take a probiotic while you are taking them.    Urinary Tract Infection Urinary tract infections (UTIs) can develop anywhere along your urinary tract. Your urinary tract is your body's drainage system for removing wastes and extra water. Your urinary tract includes two kidneys, two ureters, a bladder, and a urethra. Your kidneys are a pair of bean-shaped organs. Each kidney is about the size of your fist. They are located below your ribs, one on each side of your spine. CAUSES Infections are caused by microbes, which are microscopic organisms, including fungi, viruses, and bacteria. These organisms are so small that they can only be seen through a microscope. Bacteria are the microbes that most commonly cause UTIs. SYMPTOMS  Symptoms of UTIs may vary by age and gender of the patient and by the location of the infection. Symptoms in young women typically include a frequent and intense urge to urinate and a painful, burning feeling in the bladder or urethra during urination. Older women and men are more likely to be tired, shaky, and weak and have muscle aches and abdominal pain. A fever may mean the infection is in your kidneys. Other symptoms of a kidney infection include pain in your back or sides below the ribs, nausea, and vomiting. DIAGNOSIS To diagnose a UTI, your caregiver will ask you about your symptoms. Your caregiver will also ask you to provide a urine sample. The urine sample will be tested for bacteria and white blood cells. White blood cells are made by your body to help fight infection. TREATMENT  Typically, UTIs can be treated with medication. Because most UTIs are caused by a bacterial infection, they usually can be treated with the use of antibiotics. The choice of antibiotic and length of treatment depend on your symptoms and the type of bacteria causing your infection. HOME CARE INSTRUCTIONS  If you were  prescribed antibiotics, take them exactly as your caregiver instructs you. Finish the medication even if you feel better after you have only taken some of the medication.  Drink enough water and fluids to keep your urine clear or pale yellow.  Avoid caffeine, tea, and carbonated beverages. They tend to irritate your bladder.  Empty your bladder often. Avoid holding urine for long periods of time.  Empty your bladder before and after sexual intercourse.  After a bowel movement, women should cleanse from front to back. Use each tissue only once. SEEK MEDICAL CARE IF:   You have back pain.  You develop a fever.  Your symptoms do not begin to resolve within 3 days. SEEK IMMEDIATE MEDICAL CARE IF:   You have severe back pain or lower abdominal pain.  You develop chills.  You have nausea or vomiting.  You have continued burning or discomfort with urination. MAKE SURE YOU:   Understand these instructions.  Will watch your condition.  Will get help right away if you are not doing well or get worse.   This information is not intended to replace advice given to you by your health care provider. Make sure you discuss any questions you have with your health care provider.   Document Released: 02/13/2005 Document Revised: 01/25/2015 Document Reviewed: 06/14/2011 Elsevier Interactive Patient Education [employer].

## 2016-01-23 LAB — URINE CULTURE

## 2016-02-02 ENCOUNTER — Ambulatory Visit (HOSPITAL_COMMUNITY)
Admission: RE | Admit: 2016-02-02 | Discharge: 2016-02-02 | Disposition: A | Discharge status: Home / Self Care | Payer: Medicare Other | Source: Ambulatory Visit | Attending: Nurse Practitioner | Admitting: Nurse Practitioner

## 2016-02-02 DIAGNOSIS — R109 Unspecified abdominal pain: Secondary | ICD-10-CM | POA: Insufficient documentation

## 2016-02-02 DIAGNOSIS — R911 Solitary pulmonary nodule: Secondary | ICD-10-CM | POA: Insufficient documentation

## 2016-02-02 DIAGNOSIS — N859 Noninflammatory disorder of uterus, unspecified: Secondary | ICD-10-CM | POA: Insufficient documentation

## 2016-02-02 DIAGNOSIS — I700 Atherosclerosis of aorta: Secondary | ICD-10-CM | POA: Insufficient documentation

## 2016-02-02 MED ORDER — IOPAMIDOL (ISOVUE-300) INJECTION 61%
100.0000 mL | Freq: Once | INTRAVENOUS | Status: AC | PRN
  Administered 2016-02-02: 100 mL via INTRAVENOUS

## 2016-02-26 DIAGNOSIS — H2513 Age-related nuclear cataract, bilateral: Secondary | ICD-10-CM | POA: Diagnosis not present

## 2016-03-13 ENCOUNTER — Ambulatory Visit (INDEPENDENT_AMBULATORY_CARE_PROVIDER_SITE_OTHER): Payer: Medicare Other

## 2016-03-13 DIAGNOSIS — Z23 Encounter for immunization: Secondary | ICD-10-CM

## 2016-04-30 DIAGNOSIS — L821 Other seborrheic keratosis: Secondary | ICD-10-CM | POA: Diagnosis not present

## 2016-04-30 DIAGNOSIS — L814 Other melanin hyperpigmentation: Secondary | ICD-10-CM | POA: Diagnosis not present

## 2016-04-30 DIAGNOSIS — L570 Actinic keratosis: Secondary | ICD-10-CM | POA: Diagnosis not present

## 2016-04-30 DIAGNOSIS — D485 Neoplasm of uncertain behavior of skin: Secondary | ICD-10-CM | POA: Diagnosis not present

## 2016-05-27 ENCOUNTER — Encounter: Payer: Self-pay | Admitting: Nurse Practitioner

## 2016-05-27 ENCOUNTER — Ambulatory Visit (INDEPENDENT_AMBULATORY_CARE_PROVIDER_SITE_OTHER): Payer: Medicare Other | Admitting: Nurse Practitioner

## 2016-05-27 VITALS — BP 124/70 | HR 69 | Temp 96.4°F | Ht 62.0 in | Wt 178.0 lb

## 2016-05-27 DIAGNOSIS — F3342 Major depressive disorder, recurrent, in full remission: Secondary | ICD-10-CM

## 2016-05-27 DIAGNOSIS — N3946 Mixed incontinence: Secondary | ICD-10-CM

## 2016-05-27 DIAGNOSIS — E782 Mixed hyperlipidemia: Secondary | ICD-10-CM | POA: Diagnosis not present

## 2016-05-27 DIAGNOSIS — Z6830 Body mass index (BMI) 30.0-30.9, adult: Secondary | ICD-10-CM

## 2016-05-27 DIAGNOSIS — F17200 Nicotine dependence, unspecified, uncomplicated: Secondary | ICD-10-CM

## 2016-05-27 DIAGNOSIS — C50912 Malignant neoplasm of unspecified site of left female breast: Secondary | ICD-10-CM | POA: Diagnosis not present

## 2016-05-27 DIAGNOSIS — F5101 Primary insomnia: Secondary | ICD-10-CM

## 2016-05-27 DIAGNOSIS — J410 Simple chronic bronchitis: Secondary | ICD-10-CM | POA: Diagnosis not present

## 2016-05-27 DIAGNOSIS — E0500 Thyrotoxicosis with diffuse goiter without thyrotoxic crisis or storm: Secondary | ICD-10-CM | POA: Diagnosis not present

## 2016-05-27 DIAGNOSIS — N3281 Overactive bladder: Secondary | ICD-10-CM

## 2016-05-27 DIAGNOSIS — K769 Liver disease, unspecified: Secondary | ICD-10-CM

## 2016-05-27 DIAGNOSIS — G43001 Migraine without aura, not intractable, with status migrainosus: Secondary | ICD-10-CM

## 2016-05-27 MED ORDER — FLUTICASONE-SALMETEROL 250-50 MCG/DOSE IN AEPB: 1.0000 | INHALATION_SPRAY | Freq: Two times a day (BID) | RESPIRATORY_TRACT | 5 refills | Status: DC

## 2016-05-27 MED ORDER — METHIMAZOLE 5 MG PO TABS: ORAL_TABLET | ORAL | 5 refills | Status: DC

## 2016-05-27 MED ORDER — TIOTROPIUM BROMIDE MONOHYDRATE 18 MCG IN CAPS: 18.0000 ug | ORAL_CAPSULE | Freq: Every day | RESPIRATORY_TRACT | 5 refills | Status: DC

## 2016-05-27 MED ORDER — MIRABEGRON ER 25 MG PO TB24: 25.0000 mg | ORAL_TABLET | Freq: Every day | ORAL | 1 refills | Status: DC

## 2016-05-27 MED ORDER — SIMVASTATIN 40 MG PO TABS: ORAL_TABLET | ORAL | 5 refills | Status: DC

## 2016-05-27 MED ORDER — SUMATRIPTAN SUCCINATE 100 MG PO TABS: 100.0000 mg | ORAL_TABLET | ORAL | 5 refills | Status: DC | PRN

## 2016-05-27 MED ORDER — ESCITALOPRAM OXALATE 10 MG PO TABS: ORAL_TABLET | ORAL | 5 refills | Status: DC

## 2016-05-27 NOTE — Progress Notes (Signed)
Subjective:    Patient ID: Barbara Boyd, female    DOB: 03/02/1951, 66 y.o.   MRN: [id number]  Patient here today for follow up of chronic medical problems. No complaints today.  Outpatient Encounter Prescriptions as of 05/27/2016  Medication Sig  . escitalopram (LEXAPRO) 10 MG tablet TAKE 1 TABLET (10 MG TOTAL) BY MOUTH DAILY.  Marland Kitchen Fluticasone-Salmeterol (ADVAIR) 250-50 MCG/DOSE AEPB Inhale 1 puff into the lungs every 12 (twelve) hours.  . methimazole (TAPAZOLE) 5 MG tablet TAKE 1/2 TABLET BY MOUTH DAILY  . mirabegron ER (MYRBETRIQ) 25 MG TB24 tablet Take 1 tablet (25 mg total) by mouth daily.  . SUMAtriptan (IMITREX) 100 MG tablet Take 1 tablet (100 mg total) by mouth as needed. May repeat in 2 hours if headache persists or recurs.  Marland Kitchen tiotropium (SPIRIVA HANDIHALER) 18 MCG inhalation capsule Place 1 capsule (18 mcg total) into inhaler and inhale daily.  . simvastatin (ZOCOR) 40 MG tablet TAKE 1 TABLET (40 MG TOTAL) BY MOUTH DAILY. (Patient not taking: Reported on 05/27/2016)  * had abd ct and had a few nodules in liver and she would like to go ahead with MRI now. She denies any abdominal pain , nausea or vomiting.  Hyperlipidemia  This is a chronic problem. The current episode started more than 1 year ago. The problem is controlled. Recent lipid tests were reviewed and are normal. Current antihyperlipidemic treatment includes statins. The current treatment provides moderate improvement of lipids. Compliance problems include adherence to diet and adherence to exercise.  Risk factors for coronary artery disease include obesity and stress.  Breast cancer/ bil masectomy Had bil mastectomy Graves disease Currently on methimazole- only sees [place] now for this. COPd advair bid and spirivia and is doing well- no need for albuterol in quite sometime- denies SOB and /or wheezing. Still smoking at least 2 packs a day. No interest in quitting. Depression Lexapro- working well no side effects. Depression  screen [facility] 2/9 05/27/2016 01/20/2016 01/16/2016 11/23/2015 07/24/2015  Decreased Interest 0 1 1 1  0  Down, Depressed, Hopeless 0 1 1 1  0  PHQ - 2 Score 0 2 2 2  0  Altered sleeping - 1 1 1  -  Tired, decreased energy - 1 1 1  -  Change in appetite - 0 0 1 -  Feeling bad or failure about yourself  - 0 0 0 -  Trouble concentrating - 0 0 0 -  Moving slowly or fidgety/restless - 0 0 0 -  Suicidal thoughts - 0 0 0 -  PHQ-9 Score - 4 4 5  -  Insomnia Currently does not take anything. Still has trouble sleeping at least 2  Night a week.  Review of Systems  Constitutional: Negative.   HENT: Negative.   Respiratory: Negative.   Cardiovascular: Negative.   Genitourinary: Negative.   Neurological: Negative.   Psychiatric/Behavioral: Negative.   All other systems reviewed and are negative.      Objective:   Physical Exam  Constitutional: She is oriented to person, place, and time. She appears well-developed and well-nourished.  HENT:  Head: Normocephalic.  Right Ear: Hearing, tympanic membrane, external ear and ear canal normal.  Left Ear: Hearing, tympanic membrane, external ear and ear canal normal.  Nose: Nose normal.  Mouth/Throat: Uvula is midline and oropharynx is clear and moist.  Eyes: Conjunctivae and EOM are normal. Pupils are equal, round, and reactive to light.  Neck: Normal range of motion and full passive range of motion without pain. Neck supple.  No JVD present. Carotid bruit is not present. No thyroid mass and no thyromegaly present.  Cardiovascular: Normal rate, normal heart sounds and intact distal pulses.   No murmur heard. Pulmonary/Chest: Effort normal and breath sounds normal.  Bilateral breast implants- no breast exam done today.  Abdominal: Soft. Bowel sounds are normal. She exhibits no mass. There is no tenderness.  Genitourinary: Vagina normal and uterus normal. No breast swelling, tenderness, discharge or bleeding.  Genitourinary Comments: bimanual exam-No adnexal masses  or tenderness Cervix parous and pink no discharge.  Musculoskeletal: Normal range of motion.  Lymphadenopathy:    She has no cervical adenopathy.  Neurological: She is alert and oriented to person, place, and time.  Skin: Skin is warm and dry.  Psychiatric: She has a normal mood and affect. Her behavior is normal. Judgment and thought content normal.   BP 124/70   Pulse 69   Temp (!) 96.4 F (35.8 C) (Oral)   Ht 5\' 2"  (1.575 m)   Wt 178 lb (80.7 kg)   BMI 32.56 kg/m       Assessment & Plan:   1. Mixed hyperlipidemia Low fat diet - simvastatin (ZOCOR) 40 MG tablet; TAKE 1 TABLET (40 MG TOTAL) BY MOUTH DAILY.  Dispense: 30 tablet; Refill: 5  2. Graves disease - methimazole (TAPAZOLE) 5 MG tablet; TAKE 1/2 TABLET BY MOUTH DAILY  Dispense: 30 tablet; Refill: 5  3. Primary insomnia Bedtime routine  4. Infiltrating ductal carcinoma of breast, stage 1, left (HCC) Keep follow up with oncologist  5. Simple chronic bronchitis (HCC) - tiotropium (SPIRIVA HANDIHALER) 18 MCG inhalation capsule; Place 1 capsule (18 mcg total) into inhaler and inhale daily.  Dispense: 30 capsule; Refill: 5 - Fluticasone-Salmeterol (ADVAIR) 250-50 MCG/DOSE AEPB; Inhale 1 puff into the lungs every 12 (twelve) hours.  Dispense: 60 each; Refill: 5  6. Smoker Smoking cessation  7. Recurrent major depressive disorder, in full remission (HCC) Stress manmagement - escitalopram (LEXAPRO) 10 MG tablet; TAKE 1 TABLET (10 MG TOTAL) BY MOUTH DAILY.  Dispense: 30 tablet; Refill: 5  8. BMI 30.0-30.9,adult Discussed diet and exercise for person with BMI >25 Will recheck weight in 3-6 months  9. Liver lesion - MR LIVER W CONTRAST; Future  10. OAB (overactive bladder) 11. Urge and stress incontinence May change myrbetriq when needs refill - mirabegron ER (MYRBETRIQ) 25 MG TB24 tablet; Take 1 tablet (25 mg total) by mouth daily.  Dispense: 90 tablet; Refill: 1  12. Migraine without aura and with status  migrainosus, not intractable - SUMAtriptan (IMITREX) 100 MG tablet; Take 1 tablet (100 mg total) by mouth as needed. May repeat in 2 hours if headache persists or recurs.  Dispense: 9 tablet; Refill: 5    Labs pending Health maintenance reviewed Diet and exercise encouraged Continue all meds Follow up  In 3 months   [place], FNP

## 2016-05-27 NOTE — Patient Instructions (Signed)
Insomnia Insomnia is a sleep disorder that makes it difficult to fall asleep or to stay asleep. Insomnia can cause tiredness (fatigue), low energy, difficulty concentrating, mood swings, and poor performance at work or school. There are three different ways to classify insomnia:  Difficulty falling asleep.  Difficulty staying asleep.  Waking up too early in the morning. Any type of insomnia can be long-term (chronic) or short-term (acute). Both are common. Short-term insomnia usually lasts for three months or less. Chronic insomnia occurs at least three times a week for longer than three months. What are the causes? Insomnia may be caused by another condition, situation, or substance, such as:  Anxiety.  Certain medicines.  Gastroesophageal reflux disease (GERD) or other gastrointestinal conditions.  Asthma or other breathing conditions.  Restless legs syndrome, sleep apnea, or other sleep disorders.  Chronic pain.  Menopause. This may include hot flashes.  Stroke.  Abuse of alcohol, tobacco, or illegal drugs.  Depression.  Caffeine.  Neurological disorders, such as Alzheimer disease.  An overactive thyroid (hyperthyroidism). The cause of insomnia may not be known. What increases the risk? Risk factors for insomnia include:  Gender. Women are more commonly affected than men.  Age. Insomnia is more common as you get older.  Stress. This may involve your professional or personal life.  Income. Insomnia is more common in people with lower income.  Lack of exercise.  Irregular work schedule or night shifts.  Traveling between different time zones. What are the signs or symptoms? If you have insomnia, trouble falling asleep or trouble staying asleep is the main symptom. This may lead to other symptoms, such as:  Feeling fatigued.  Feeling nervous about going to sleep.  Not feeling rested in the morning.  Having trouble concentrating.  Feeling irritable,  anxious, or depressed. How is this treated? Treatment for insomnia depends on the cause. If your insomnia is caused by an underlying condition, treatment will focus on addressing the condition. Treatment may also include:  Medicines to help you sleep.  Counseling or therapy.  Lifestyle adjustments. Follow these instructions at home:  Take medicines only as directed by your health care provider.  Keep regular sleeping and waking hours. Avoid naps.  Keep a sleep diary to help you and your health care provider figure out what could be causing your insomnia. Include:  When you sleep.  When you wake up during the night.  How well you sleep.  How rested you feel the next day.  Any side effects of medicines you are taking.  What you eat and drink.  Make your bedroom a comfortable place where it is easy to fall asleep:  Put up shades or special blackout curtains to block light from outside.  Use a white noise machine to block noise.  Keep the temperature cool.  Exercise regularly as directed by your health care provider. Avoid exercising right before bedtime.  Use relaxation techniques to manage stress. Ask your health care provider to suggest some techniques that may work well for you. These may include:  Breathing exercises.  Routines to release muscle tension.  Visualizing peaceful scenes.  Cut back on alcohol, caffeinated beverages, and cigarettes, especially close to bedtime. These can disrupt your sleep.  Do not overeat or eat spicy foods right before bedtime. This can lead to digestive discomfort that can make it hard for you to sleep.  Limit screen use before bedtime. This includes:  Watching TV.  Using your smartphone, tablet, and computer.  Stick to a   routine. This can help you fall asleep faster. Try to do a quiet activity, brush your teeth, and go to bed at the same time each night.  Get out of bed if you are still awake after 15 minutes of trying to  sleep. Keep the lights down, but try reading or doing a quiet activity. When you feel sleepy, go back to bed.  Make sure that you drive carefully. Avoid driving if you feel very sleepy.  Keep all follow-up appointments as directed by your health care provider. This is important. Contact a health care provider if:  You are tired throughout the day or have trouble in your daily routine due to sleepiness.  You continue to have sleep problems or your sleep problems get worse. Get help right away if:  You have serious thoughts about hurting yourself or someone else. This information is not intended to replace advice given to you by your health care provider. Make sure you discuss any questions you have with your health care provider. Document Released: 05/03/2000 Document Revised: 10/06/2015 Document Reviewed: 02/04/2014 Elsevier Interactive Patient Education  2017 Elsevier Inc.  

## 2016-05-28 LAB — CMP14+EGFR
A/G RATIO: 1.7 (ref 1.2–2.2)
ALBUMIN: 4 g/dL (ref 3.6–4.8)
ALT: 11 IU/L (ref 0–32)
AST: 15 IU/L (ref 0–40)
Alkaline Phosphatase: 66 IU/L (ref 39–117)
BILIRUBIN TOTAL: 0.2 mg/dL (ref 0.0–1.2)
BUN / CREAT RATIO: 16 (ref 12–28)
BUN: 10 mg/dL (ref 8–27)
CHLORIDE: 97 mmol/L (ref 96–106)
CO2: 24 mmol/L (ref 18–29)
Calcium: 8.9 mg/dL (ref 8.7–10.3)
Creatinine, Ser: 0.62 mg/dL (ref 0.57–1.00)
GFR calc non Af Amer: 95 mL/min/{1.73_m2} (ref >59)
GFR, EST AFRICAN AMERICAN: 109 mL/min/{1.73_m2} (ref >59)
Globulin, Total: 2.4 g/dL (ref 1.5–4.5)
Glucose: 98 mg/dL (ref 65–99)
POTASSIUM: 4.4 mmol/L (ref 3.5–5.2)
SODIUM: 136 mmol/L (ref 134–144)
TOTAL PROTEIN: 6.4 g/dL (ref 6.0–8.5)

## 2016-05-28 LAB — THYROID PANEL WITH TSH
FREE THYROXINE INDEX: 1.9 (ref 1.2–4.9)
T3 UPTAKE RATIO: 26 % (ref 24–39)
T4 TOTAL: 7.3 ug/dL (ref 4.5–12.0)
TSH: 2.22 u[IU]/mL (ref 0.450–4.500)

## 2016-05-28 LAB — LIPID PANEL
Chol/HDL Ratio: 3.3 ratio units (ref 0.0–4.4)
Cholesterol, Total: 210 mg/dL — ABNORMAL HIGH (ref 100–199)
HDL: 63 mg/dL (ref >39)
LDL Calculated: 136 mg/dL — ABNORMAL HIGH (ref 0–99)
Triglycerides: 57 mg/dL (ref 0–149)
VLDL Cholesterol Cal: 11 mg/dL (ref 5–40)

## 2016-05-30 ENCOUNTER — Other Ambulatory Visit: Payer: Self-pay

## 2016-05-30 ENCOUNTER — Other Ambulatory Visit: Payer: Self-pay | Admitting: Nurse Practitioner

## 2016-05-30 DIAGNOSIS — K769 Liver disease, unspecified: Secondary | ICD-10-CM

## 2016-06-04 DIAGNOSIS — L570 Actinic keratosis: Secondary | ICD-10-CM | POA: Diagnosis not present

## 2016-06-27 ENCOUNTER — Telehealth: Payer: Self-pay | Admitting: Nurse Practitioner

## 2016-07-01 IMAGING — CR DG ABDOMEN 1V
1 series · 1 of 1 positions shown · non-contrast
Comparison: None.

CLINICAL DATA: Left lower quadrant abdominal pain

EXAM:
ABDOMEN - 1 VIEW

[view not recorded]
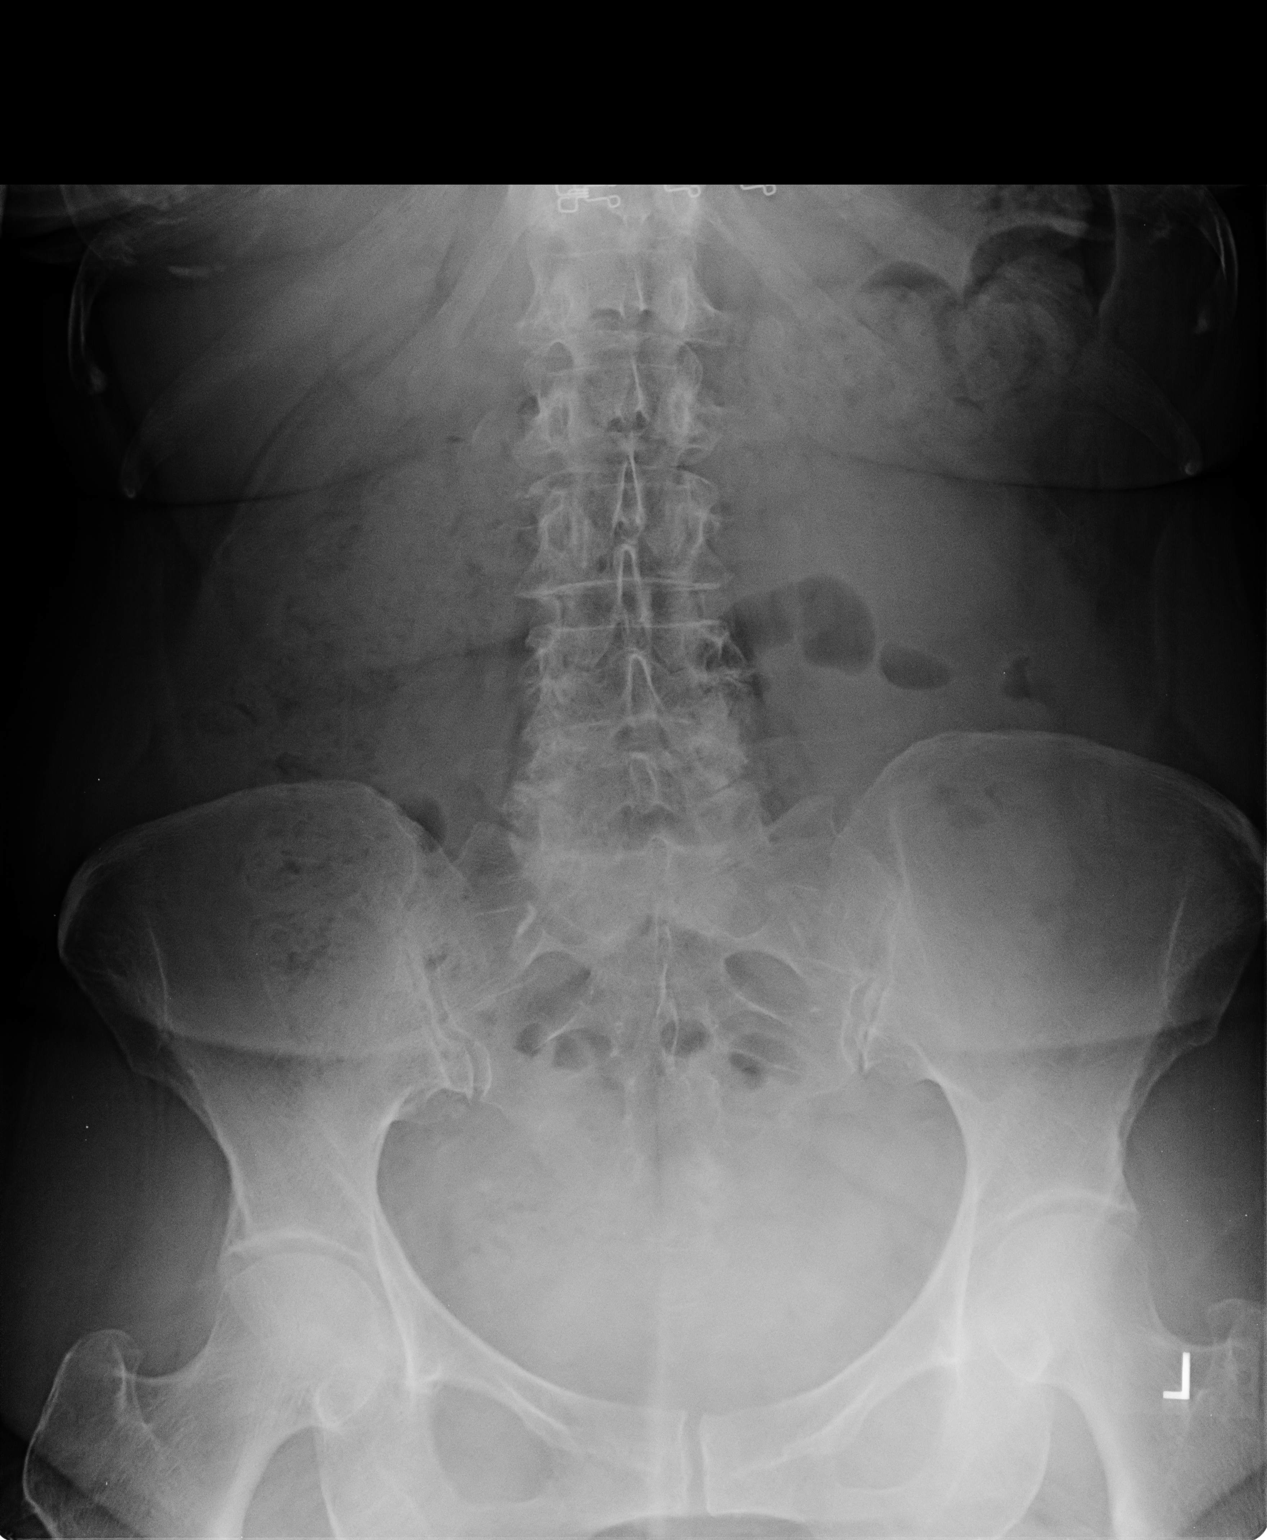

[1 of 1 positions shown; findings below may reference images not displayed]

FINDINGS: Moderate volume of formed stool, distending the colon from the cecum
to the splenic flexure. No evidence of small bowel obstruction. No
concerning intra-abdominal mass effect or calcification. Hazy
peripheral opacities at the bases suggesting pleural effusions, new
from chest x-ray 11/18/2014.
IMPRESSION: 1. Moderate stool volume without evidence of obstruction.
2. Limited visualization of the lung bases but suspect bilateral
pleural effusion, new from November 2014. Suggest chest radiography.

## 2016-07-05 ENCOUNTER — Ambulatory Visit (HOSPITAL_COMMUNITY)
Admission: RE | Admit: 2016-07-05 | Discharge: 2016-07-05 | Disposition: A | Discharge status: Home / Self Care | Payer: Medicare Other | Source: Ambulatory Visit | Attending: Nurse Practitioner | Admitting: Nurse Practitioner

## 2016-07-05 DIAGNOSIS — K769 Liver disease, unspecified: Secondary | ICD-10-CM | POA: Diagnosis not present

## 2016-07-05 DIAGNOSIS — K7689 Other specified diseases of liver: Secondary | ICD-10-CM | POA: Diagnosis not present

## 2016-07-05 MED ORDER — GADOBENATE DIMEGLUMINE 529 MG/ML IV SOLN
20.0000 mL | Freq: Once | INTRAVENOUS | Status: AC | PRN
  Administered 2016-07-05: 16 mL via INTRAVENOUS

## 2016-07-11 ENCOUNTER — Telehealth: Payer: Self-pay | Admitting: Family

## 2016-07-11 NOTE — Telephone Encounter (Signed)
Patient has not received results from her MRI on 07/05/16, please advise.

## 2016-07-12 NOTE — Telephone Encounter (Signed)
This has been completed.

## 2016-08-26 ENCOUNTER — Ambulatory Visit (INDEPENDENT_AMBULATORY_CARE_PROVIDER_SITE_OTHER): Payer: Medicare Other | Admitting: Nurse Practitioner

## 2016-08-26 ENCOUNTER — Encounter: Payer: Self-pay | Admitting: Nurse Practitioner

## 2016-08-26 VITALS — BP 113/67 | HR 70 | Temp 97.3°F | Ht 62.0 in | Wt 180.0 lb

## 2016-08-26 DIAGNOSIS — Z23 Encounter for immunization: Secondary | ICD-10-CM

## 2016-08-26 DIAGNOSIS — F17200 Nicotine dependence, unspecified, uncomplicated: Secondary | ICD-10-CM | POA: Diagnosis not present

## 2016-08-26 DIAGNOSIS — G43001 Migraine without aura, not intractable, with status migrainosus: Secondary | ICD-10-CM

## 2016-08-26 DIAGNOSIS — E782 Mixed hyperlipidemia: Secondary | ICD-10-CM | POA: Diagnosis not present

## 2016-08-26 DIAGNOSIS — C50912 Malignant neoplasm of unspecified site of left female breast: Secondary | ICD-10-CM

## 2016-08-26 DIAGNOSIS — J410 Simple chronic bronchitis: Secondary | ICD-10-CM | POA: Diagnosis not present

## 2016-08-26 DIAGNOSIS — E0500 Thyrotoxicosis with diffuse goiter without thyrotoxic crisis or storm: Secondary | ICD-10-CM | POA: Diagnosis not present

## 2016-08-26 DIAGNOSIS — F3342 Major depressive disorder, recurrent, in full remission: Secondary | ICD-10-CM

## 2016-08-26 DIAGNOSIS — Z6830 Body mass index (BMI) 30.0-30.9, adult: Secondary | ICD-10-CM

## 2016-08-26 DIAGNOSIS — F5101 Primary insomnia: Secondary | ICD-10-CM

## 2016-08-26 DIAGNOSIS — Z122 Encounter for screening for malignant neoplasm of respiratory organs: Secondary | ICD-10-CM

## 2016-08-26 DIAGNOSIS — Z87898 Personal history of other specified conditions: Secondary | ICD-10-CM

## 2016-08-26 NOTE — Progress Notes (Signed)
Subjective:    Patient ID: Barbara Boyd, female    DOB: [birth year], 66 y.o.   MRN: [id number]  HPI   Barbara Boyd is here today for follow up of chronic medical problem.  Outpatient Encounter Prescriptions as of 08/26/2016  Medication Sig  . escitalopram (LEXAPRO) 10 MG tablet TAKE 1 TABLET (10 MG TOTAL) BY MOUTH DAILY.  Marland Kitchen Fluticasone-Salmeterol (ADVAIR) 250-50 MCG/DOSE AEPB Inhale 1 puff into the lungs every 12 (twelve) hours.  . methimazole (TAPAZOLE) 5 MG tablet TAKE 1/2 TABLET BY MOUTH DAILY  . mirabegron ER (MYRBETRIQ) 25 MG TB24 tablet Take 1 tablet (25 mg total) by mouth daily.  . simvastatin (ZOCOR) 40 MG tablet TAKE 1 TABLET (40 MG TOTAL) BY MOUTH DAILY.  . SUMAtriptan (IMITREX) 100 MG tablet Take 1 tablet (100 mg total) by mouth as needed. May repeat in 2 hours if headache persists or recurs.  Marland Kitchen tiotropium (SPIRIVA HANDIHALER) 18 MCG inhalation capsule Place 1 capsule (18 mcg total) into inhaler and inhale daily.   No facility-administered encounter medications on file as of 08/26/2016.     1. Simple chronic bronchitis ([place])  Still has occasional cough- is on spirivia  2. Graves disease  On methimazole daily- no complaints  3. BMI 30.0-30.9,adult  Weight up 2 lbs today from last visit  4. Recurrent major depressive disorder, in full remission ([place])  Is on lexapro which really helps- ays she thinks sheis doing well right now Depression screen [facility] 2/9 08/26/2016 05/27/2016 01/20/2016 01/16/2016 11/23/2015  Decreased Interest 1 0 1 1 1   Down, Depressed, Hopeless 1 0 1 1 1   PHQ - 2 Score 2 0 2 2 2   Altered sleeping 0 - 1 1 1   Tired, decreased energy 1 - 1 1 1   Change in appetite 1 - 0 0 1  Feeling bad or failure about yourself  0 - 0 0 0  Trouble concentrating 0 - 0 0 0  Moving slowly or fidgety/restless 0 - 0 0 0  Suicidal thoughts 0 - 0 0 0  PHQ-9 Score 4 - 4 4 5      5. Mixed hyperlipidemia  Not watching diet at all- very little side effects  6. Primary insomnia  Has  been sleeping okay lately  7. Infiltrating ductal carcinoma of breast, stage 1, left (HCC)  Has completed all of her treatments  8. Smoker  Still smoking- afraid of gaining weight if she stops smoking  9. Migraine without aura and with status migrainosus, not intractable  had one last week- imitrex works well if take med as soon as starts.   * had chest CT in 01/2016 which showed faint pulmonary nodule- time for repeat CT -SMoker  New complaints: No new complaints today    Review of Systems  Constitutional: Negative.   HENT: Negative.   Respiratory: Negative.   Cardiovascular: Negative.   Gastrointestinal: Negative.   Genitourinary: Negative.   Neurological: Negative.   Psychiatric/Behavioral: Negative.   All other systems reviewed and are negative.      Objective:   Physical Exam  Constitutional: She is oriented to person, place, and time. She appears well-developed and well-nourished.  HENT:  Nose: Nose normal.  Mouth/Throat: Oropharynx is clear and moist.  Eyes: EOM are normal.  Neck: Trachea normal, normal range of motion and full passive range of motion without pain. Neck supple. No JVD present. Carotid bruit is not present. No thyromegaly present.  Cardiovascular: Normal rate, regular rhythm, normal heart sounds and  intact distal pulses.  Exam reveals no gallop and no friction rub.   No murmur heard. Pulmonary/Chest: Effort normal. She has wheezes (exp wheezes throughout).  Abdominal: Soft. Bowel sounds are normal. She exhibits no distension and no mass. There is no tenderness.  Musculoskeletal: Normal range of motion.  Lymphadenopathy:    She has no cervical adenopathy.  Neurological: She is alert and oriented to person, place, and time. She has normal reflexes.  Skin: Skin is warm and dry.  Psychiatric: She has a normal mood and affect. Her behavior is normal. Judgment and thought content normal.   BP 113/67   Pulse 70   Temp 97.3 F (36.3 C) (Oral)   Ht 5'  2" (1.575 m)   Wt 180 lb (81.6 kg)   BMI 32.92 kg/m      Assessment & Plan:  1. Simple chronic bronchitis ([place]) Continue spirivia as rx  2. Graves disease - Thyroid Panel With TSH  3. BMI 30.0-30.9,adult Discussed diet and exercise for person with BMI >25 Will recheck weight in 3-6 months  4. Recurrent major depressive disorder, in full remission ([place]) stress management  5. Mixed hyperlipidemia Low fat diet - CMP14+EGFR - Lipid panel  6. Primary insomnia bedime routine  7. Infiltrating ductal carcinoma of breast, stage 1, left (HCC) Keep follow up with oncology  8. Smoker Smoking cessation encouraged  9. Migraine without aura and with status migrainosus, not intractable Void caffeine  Orders Placed This Encounter  Procedures  . CT CHEST NODULE FOLLOW UP LOW DOSE W/O    Standing Status:   Future    Standing Expiration Date:   10/26/2017    Order Specific Question:   Reason for Exam (SYMPTOM  OR DIAGNOSIS REQUIRED)    Answer:   lung nodule    Order Specific Question:   Preferred imaging location?    Answer:   [facility]    Order Specific Question:   Radiology Contrast Protocol - do NOT remove file path    Answer:   [email or web address]  . CMP14+EGFR  . Lipid panel  . Thyroid Panel With TSH     Labs pending Health maintenance reviewed Diet and exercise encouraged Continue all meds Follow up  In 3 months   [place], FNP

## 2016-08-26 NOTE — Addendum Note (Signed)
Addended by: Chevis Pretty on: 08/26/2016 11:04 AM   Modules accepted: Orders

## 2016-08-26 NOTE — Patient Instructions (Signed)
Steps to Quit Smoking Smoking tobacco can be bad for your health. It can also affect almost every organ in your body. Smoking puts you and people around you at risk for many serious long-lasting (chronic) diseases. Quitting smoking is hard, but it is one of the best things that you can do for your health. It is never too late to quit. What are the benefits of quitting smoking? When you quit smoking, you lower your risk for getting serious diseases and conditions. They can include:  Lung cancer or lung disease.  Heart disease.  Stroke.  Heart attack.  Not being able to have children (infertility).  Weak bones (osteoporosis) and broken bones (fractures). If you have coughing, wheezing, and shortness of breath, those symptoms may get better when you quit. You may also get sick less often. If you are pregnant, quitting smoking can help to lower your chances of having a baby of low birth weight. What can I do to help me quit smoking? Talk with your doctor about what can help you quit smoking. Some things you can do (strategies) include:  Quitting smoking totally, instead of slowly cutting back how much you smoke over a period of time.  Going to in-person counseling. You are more likely to quit if you go to many counseling sessions.  Using resources and support systems, such as:  Online chats with a counselor.  Phone quitlines.  Printed self-help materials.  Support groups or group counseling.  Text messaging programs.  Mobile phone apps or applications.  Taking medicines. Some of these medicines may have nicotine in them. If you are pregnant or breastfeeding, do not take any medicines to quit smoking unless your doctor says it is okay. Talk with your doctor about counseling or other things that can help you. Talk with your doctor about using more than one strategy at the same time, such as taking medicines while you are also going to in-person counseling. This can help make quitting  easier. What things can I do to make it easier to quit? Quitting smoking might feel very hard at first, but there is a lot that you can do to make it easier. Take these steps:  Talk to your family and friends. Ask them to support and encourage you.  Call phone quitlines, reach out to support groups, or work with a counselor.  Ask people who smoke to not smoke around you.  Avoid places that make you want (trigger) to smoke, such as:  Bars.  Parties.  Smoke-break areas at work.  Spend time with people who do not smoke.  Lower the stress in your life. Stress can make you want to smoke. Try these things to help your stress:  Getting regular exercise.  Deep-breathing exercises.  Yoga.  Meditating.  Doing a body scan. To do this, close your eyes, focus on one area of your body at a time from head to toe, and notice which parts of your body are tense. Try to relax the muscles in those areas.  Download or buy apps on your mobile phone or tablet that can help you stick to your quit plan. There are many free apps, such as QuitGuide from the CDC (Centers for Disease Control and Prevention). You can find more support from smokefree.gov and other websites. This information is not intended to replace advice given to you by your health care provider. Make sure you discuss any questions you have with your health care provider. Document Released: 03/02/2009 Document Revised: 01/02/2016 Document   Reviewed: 09/20/2014 Elsevier Interactive Patient Education  2017 Elsevier Inc.  

## 2016-08-26 NOTE — Addendum Note (Signed)
Addended by: Rolena Infante on: 08/26/2016 12:52 PM   Modules accepted: Orders

## 2016-09-06 ENCOUNTER — Other Ambulatory Visit: Payer: Self-pay | Admitting: Nurse Practitioner

## 2016-09-06 ENCOUNTER — Telehealth: Payer: Self-pay | Admitting: Nurse Practitioner

## 2016-09-06 ENCOUNTER — Ambulatory Visit (HOSPITAL_COMMUNITY)
Admission: RE | Admit: 2016-09-06 | Discharge: 2016-09-06 | Disposition: A | Discharge status: Home / Self Care | Payer: Medicare Other | Source: Ambulatory Visit | Attending: Nurse Practitioner | Admitting: Nurse Practitioner

## 2016-09-06 DIAGNOSIS — F17210 Nicotine dependence, cigarettes, uncomplicated: Secondary | ICD-10-CM | POA: Diagnosis not present

## 2016-09-06 DIAGNOSIS — K5730 Diverticulosis of large intestine without perforation or abscess without bleeding: Secondary | ICD-10-CM | POA: Insufficient documentation

## 2016-09-06 DIAGNOSIS — I2510 Atherosclerotic heart disease of native coronary artery without angina pectoris: Secondary | ICD-10-CM | POA: Insufficient documentation

## 2016-09-06 DIAGNOSIS — R918 Other nonspecific abnormal finding of lung field: Secondary | ICD-10-CM | POA: Diagnosis not present

## 2016-09-06 DIAGNOSIS — I700 Atherosclerosis of aorta: Secondary | ICD-10-CM | POA: Diagnosis not present

## 2016-09-06 DIAGNOSIS — J432 Centrilobular emphysema: Secondary | ICD-10-CM | POA: Diagnosis not present

## 2016-09-06 DIAGNOSIS — R911 Solitary pulmonary nodule: Secondary | ICD-10-CM

## 2016-09-06 DIAGNOSIS — Z122 Encounter for screening for malignant neoplasm of respiratory organs: Secondary | ICD-10-CM | POA: Insufficient documentation

## 2016-09-06 DIAGNOSIS — F17200 Nicotine dependence, unspecified, uncomplicated: Secondary | ICD-10-CM

## 2016-09-06 NOTE — Telephone Encounter (Signed)
Had to change order for CT according to radiology- they will call insurance for approval

## 2016-11-13 DIAGNOSIS — Z170 Estrogen receptor positive status [ER+]: Secondary | ICD-10-CM | POA: Diagnosis not present

## 2016-11-13 DIAGNOSIS — Z716 Tobacco abuse counseling: Secondary | ICD-10-CM | POA: Diagnosis not present

## 2016-11-13 DIAGNOSIS — F17200 Nicotine dependence, unspecified, uncomplicated: Secondary | ICD-10-CM | POA: Diagnosis not present

## 2016-11-13 DIAGNOSIS — Z9013 Acquired absence of bilateral breasts and nipples: Secondary | ICD-10-CM | POA: Diagnosis not present

## 2016-11-13 DIAGNOSIS — F17210 Nicotine dependence, cigarettes, uncomplicated: Secondary | ICD-10-CM | POA: Diagnosis not present

## 2016-11-13 DIAGNOSIS — Z803 Family history of malignant neoplasm of breast: Secondary | ICD-10-CM | POA: Diagnosis not present

## 2016-11-13 DIAGNOSIS — C50412 Malignant neoplasm of upper-outer quadrant of left female breast: Secondary | ICD-10-CM | POA: Diagnosis not present

## 2016-11-13 DIAGNOSIS — Z9882 Breast implant status: Secondary | ICD-10-CM | POA: Diagnosis not present

## 2016-11-15 DIAGNOSIS — Z716 Tobacco abuse counseling: Secondary | ICD-10-CM | POA: Insufficient documentation

## 2016-11-28 ENCOUNTER — Ambulatory Visit (INDEPENDENT_AMBULATORY_CARE_PROVIDER_SITE_OTHER): Payer: Medicare Other | Admitting: Nurse Practitioner

## 2016-11-28 ENCOUNTER — Encounter: Payer: Self-pay | Admitting: Nurse Practitioner

## 2016-11-28 VITALS — BP 122/64 | HR 62 | Temp 96.8°F | Ht 62.0 in | Wt 178.0 lb

## 2016-11-28 DIAGNOSIS — E782 Mixed hyperlipidemia: Secondary | ICD-10-CM | POA: Diagnosis not present

## 2016-11-28 DIAGNOSIS — E559 Vitamin D deficiency, unspecified: Secondary | ICD-10-CM | POA: Diagnosis not present

## 2016-11-28 DIAGNOSIS — F17200 Nicotine dependence, unspecified, uncomplicated: Secondary | ICD-10-CM | POA: Diagnosis not present

## 2016-11-28 DIAGNOSIS — G43001 Migraine without aura, not intractable, with status migrainosus: Secondary | ICD-10-CM | POA: Diagnosis not present

## 2016-11-28 DIAGNOSIS — M8580 Other specified disorders of bone density and structure, unspecified site: Secondary | ICD-10-CM | POA: Diagnosis not present

## 2016-11-28 DIAGNOSIS — F3342 Major depressive disorder, recurrent, in full remission: Secondary | ICD-10-CM

## 2016-11-28 DIAGNOSIS — Z6830 Body mass index (BMI) 30.0-30.9, adult: Secondary | ICD-10-CM | POA: Diagnosis not present

## 2016-11-28 DIAGNOSIS — E0500 Thyrotoxicosis with diffuse goiter without thyrotoxic crisis or storm: Secondary | ICD-10-CM

## 2016-11-28 DIAGNOSIS — N3281 Overactive bladder: Secondary | ICD-10-CM | POA: Diagnosis not present

## 2016-11-28 DIAGNOSIS — N3946 Mixed incontinence: Secondary | ICD-10-CM

## 2016-11-28 DIAGNOSIS — J410 Simple chronic bronchitis: Secondary | ICD-10-CM

## 2016-11-28 DIAGNOSIS — C50912 Malignant neoplasm of unspecified site of left female breast: Secondary | ICD-10-CM

## 2016-11-28 DIAGNOSIS — F5101 Primary insomnia: Secondary | ICD-10-CM

## 2016-11-28 MED ORDER — SIMVASTATIN 40 MG PO TABS: ORAL_TABLET | ORAL | 5 refills | Status: DC

## 2016-11-28 MED ORDER — METHIMAZOLE 5 MG PO TABS: ORAL_TABLET | ORAL | 5 refills | Status: DC

## 2016-11-28 MED ORDER — TIOTROPIUM BROMIDE MONOHYDRATE 18 MCG IN CAPS: 18.0000 ug | ORAL_CAPSULE | Freq: Every day | RESPIRATORY_TRACT | 5 refills | Status: DC

## 2016-11-28 MED ORDER — ESCITALOPRAM OXALATE 10 MG PO TABS: ORAL_TABLET | ORAL | 5 refills | Status: DC

## 2016-11-28 MED ORDER — MIRABEGRON ER 25 MG PO TB24: 25.0000 mg | ORAL_TABLET | Freq: Every day | ORAL | 1 refills | Status: DC

## 2016-11-28 MED ORDER — SUMATRIPTAN SUCCINATE 100 MG PO TABS: 100.0000 mg | ORAL_TABLET | ORAL | 5 refills | Status: DC | PRN

## 2016-11-28 MED ORDER — FLUTICASONE-SALMETEROL 250-50 MCG/DOSE IN AEPB: 1.0000 | INHALATION_SPRAY | Freq: Two times a day (BID) | RESPIRATORY_TRACT | 5 refills | Status: DC

## 2016-11-28 NOTE — Patient Instructions (Signed)
Steps to Quit Smoking Smoking tobacco can be bad for your health. It can also affect almost every organ in your body. Smoking puts you and people around you at risk for many serious long-lasting (chronic) diseases. Quitting smoking is hard, but it is one of the best things that you can do for your health. It is never too late to quit. What are the benefits of quitting smoking? When you quit smoking, you lower your risk for getting serious diseases and conditions. They can include:  Lung cancer or lung disease.  Heart disease.  Stroke.  Heart attack.  Not being able to have children (infertility).  Weak bones (osteoporosis) and broken bones (fractures).  If you have coughing, wheezing, and shortness of breath, those symptoms may get better when you quit. You may also get sick less often. If you are pregnant, quitting smoking can help to lower your chances of having a baby of low birth weight. What can I do to help me quit smoking? Talk with your doctor about what can help you quit smoking. Some things you can do (strategies) include:  Quitting smoking totally, instead of slowly cutting back how much you smoke over a period of time.  Going to in-person counseling. You are more likely to quit if you go to many counseling sessions.  Using resources and support systems, such as: ? Online chats with a counselor. ? Phone quitlines. ? Printed self-help materials. ? Support groups or group counseling. ? Text messaging programs. ? Mobile phone apps or applications.  Taking medicines. Some of these medicines may have nicotine in them. If you are pregnant or breastfeeding, do not take any medicines to quit smoking unless your doctor says it is okay. Talk with your doctor about counseling or other things that can help you.  Talk with your doctor about using more than one strategy at the same time, such as taking medicines while you are also going to in-person counseling. This can help make  quitting easier. What things can I do to make it easier to quit? Quitting smoking might feel very hard at first, but there is a lot that you can do to make it easier. Take these steps:  Talk to your family and friends. Ask them to support and encourage you.  Call phone quitlines, reach out to support groups, or work with a counselor.  Ask people who smoke to not smoke around you.  Avoid places that make you want (trigger) to smoke, such as: ? Bars. ? Parties. ? Smoke-break areas at work.  Spend time with people who do not smoke.  Lower the stress in your life. Stress can make you want to smoke. Try these things to help your stress: ? Getting regular exercise. ? Deep-breathing exercises. ? Yoga. ? Meditating. ? Doing a body scan. To do this, close your eyes, focus on one area of your body at a time from head to toe, and notice which parts of your body are tense. Try to relax the muscles in those areas.  Download or buy apps on your mobile phone or tablet that can help you stick to your quit plan. There are many free apps, such as QuitGuide from the CDC (Centers for Disease Control and Prevention). You can find more support from smokefree.gov and other websites.  This information is not intended to replace advice given to you by your health care provider. Make sure you discuss any questions you have with your health care provider. Document Released: 03/02/2009 Document   Revised: 01/02/2016 Document Reviewed: 09/20/2014 Elsevier Interactive Patient Education  2018 Elsevier Inc.  

## 2016-11-28 NOTE — Progress Notes (Signed)
Subjective:    Patient ID: Barbara Boyd, female    DOB: Feb 18, 1951, 66 y.o.   MRN: [id number]  HPI Barbara Boyd is here today for follow up of chronic medical problem.  Outpatient Encounter Prescriptions as of 11/28/2016  Medication Sig  . escitalopram (LEXAPRO) 10 MG tablet TAKE 1 TABLET (10 MG TOTAL) BY MOUTH DAILY.  Marland Kitchen Fluticasone-Salmeterol (ADVAIR) 250-50 MCG/DOSE AEPB Inhale 1 puff into the lungs every 12 (twelve) hours.  . methimazole (TAPAZOLE) 5 MG tablet TAKE 1/2 TABLET BY MOUTH DAILY  . mirabegron ER (MYRBETRIQ) 25 MG TB24 tablet Take 1 tablet (25 mg total) by mouth daily.  . simvastatin (ZOCOR) 40 MG tablet TAKE 1 TABLET (40 MG TOTAL) BY MOUTH DAILY.  . SUMAtriptan (IMITREX) 100 MG tablet Take 1 tablet (100 mg total) by mouth as needed. May repeat in 2 hours if headache persists or recurs.  Marland Kitchen tiotropium (SPIRIVA HANDIHALER) 18 MCG inhalation capsule Place 1 capsule (18 mcg total) into inhaler and inhale daily.   No facility-administered encounter medications on file as of 11/28/2016.     1. Mixed hyperlipidemia  Patient taking simvastatin for lipid management.  2. Osteopenia, unspecified location  Patient participates in weight-bearing exercise and increased calcium in diet.  3. Simple chronic bronchitis ([place])  Patient still has occasional symptoms.  Taking Spiriva as needed.  4. Vitamin D deficiency  Patient getting out in the sun.  5. Infiltrating ductal carcinoma of breast, stage 1, left (HCC)  Patient has completed all treatments.  No new issues.  6. Recurrent major depressive disorder, in full remission [facility])  Patient takes escitalopram daily for symptom management.  7. Smoker  Patient continues to smoke.  Concerned about gaining weight if she stops.  8. Primary insomnia  No current complaints about sleep.  9. BMI 30.0-30.9,adult  No recent weight gain or loss.  10. Graves disease  Managed with methimazole.  11. Migraine without aura and with status  migrainosus, not intractable  Patient takes Imitrex at headache onset and symptoms improve.  Patient states she has about 2-3 migraines per month.    New complaints: None today.    Review of Systems  Constitutional: Negative for activity change, appetite change and fatigue.  Respiratory: Positive for cough (intermittent), shortness of breath (intermittent) and wheezing (intermittent).   Cardiovascular: Negative for chest pain and palpitations.  Gastrointestinal: Negative for abdominal distention and abdominal pain.  Neurological: Negative for dizziness, light-headedness and headaches.  All other systems reviewed and are negative.      Objective:   Physical Exam  Constitutional: She is oriented to person, place, and time. She appears well-developed and well-nourished. No distress.  HENT:  Head: Normocephalic.  Right Ear: External ear normal.  Left Ear: External ear normal.  Mouth/Throat: Oropharynx is clear and moist.  Eyes: Pupils are equal, round, and reactive to light.  Neck: Normal range of motion. Neck supple. No JVD present. No thyromegaly present.  Cardiovascular: Normal rate, regular rhythm, normal heart sounds and intact distal pulses.  Exam reveals no gallop and no friction rub.   No murmur heard. Pulmonary/Chest: Effort normal. No respiratory distress. She has wheezes (left lower lobe).  Abdominal: Soft. Bowel sounds are normal. She exhibits no distension. There is no tenderness.  Musculoskeletal: Normal range of motion. She exhibits no edema.  Lymphadenopathy:    She has no cervical adenopathy.  Neurological: She is alert and oriented to person, place, and time.  Skin: Skin is warm and dry.  Psychiatric:  She has a normal mood and affect. Her behavior is normal. Judgment and thought content normal.   BP 122/64   Pulse 62   Temp (!) 96.8 F (36 C) (Oral)   Ht _0  (1.575 m)   Wt 178 lb (80.7 kg)   BMI 32.56 kg/m      Assessment & Plan:  1. Mixed  hyperlipidemia Low fta idet - simvastatin (ZOCOR) 40 MG tablet; TAKE 1 TABLET (40 MG TOTAL) BY MOUTH DAILY.  Dispense: 30 tablet; Refill: 5 - CMP14+EGFR - Lipid panel  2. Osteopenia, unspecified location Weight bearing exercises  3. Simple chronic bronchitis (HCC) - tiotropium (SPIRIVA HANDIHALER) 18 MCG inhalation capsule; Place 1 capsule (18 mcg total) into inhaler and inhale daily.  Dispense: 30 capsule; Refill: 5 - Fluticasone-Salmeterol (ADVAIR) 250-50 MCG/DOSE AEPB; Inhale 1 puff into the lungs every 12 (twelve) hours.  Dispense: 60 each; Refill: 5  4. Vitamin D deficiency  5. Infiltrating ductal carcinoma of breast, stage 1, left ([place]) Follow up with oncology at least yearly  6. Recurrent major depressive disorder, in full remission ([place]) - escitalopram (LEXAPRO) 10 MG tablet; TAKE 1 TABLET (10 MG TOTAL) BY MOUTH DAILY.  Dispense: 30 tablet; Refill: 5  7. Smoker Smoking cessation encouraged  8. Primary insomnia Bedtime routine  9. BMI 30.0-30.9,adult Discussed diet and exercise for person with BMI >25 Will recheck weight in 3-6 months  10. Graves disease - methimazole (TAPAZOLE) 5 MG tablet; TAKE 1/2 TABLET BY MOUTH DAILY  Dispense: 30 tablet; Refill: 5  11. Migraine without aura and with status migrainosus, not intractable - SUMAtriptan (IMITREX) 100 MG tablet; Take 1 tablet (100 mg total) by mouth as needed. May repeat in 2 hours if headache persists or recurs.  Dispense: 9 tablet; Refill: 5  12. OAB (overactive bladder) - mirabegron ER (MYRBETRIQ) 25 MG TB24 tablet; Take 1 tablet (25 mg total) by mouth daily.  Dispense: 90 tablet; Refill: 1  13. Urge and stress incontinence - mirabegron ER (MYRBETRIQ) 25 MG TB24 tablet; Take 1 tablet (25 mg total) by mouth daily.  Dispense: 90 tablet; Refill: 1    Labs pending Health maintenance reviewed Diet and exercise encouraged Continue all meds Follow up  In 3 months   [place], FNP

## 2016-11-29 LAB — CMP14+EGFR
ALK PHOS: 78 IU/L (ref 39–117)
ALT: 16 IU/L (ref 0–32)
AST: 25 IU/L (ref 0–40)
Albumin/Globulin Ratio: 1.8 (ref 1.2–2.2)
Albumin: 4.1 g/dL (ref 3.6–4.8)
BUN/Creatinine Ratio: 10 — ABNORMAL LOW (ref 12–28)
BUN: 7 mg/dL — ABNORMAL LOW (ref 8–27)
Bilirubin Total: 0.3 mg/dL (ref 0.0–1.2)
CALCIUM: 8.9 mg/dL (ref 8.7–10.3)
CHLORIDE: 101 mmol/L (ref 96–106)
CO2: 25 mmol/L (ref 20–29)
Creatinine, Ser: 0.67 mg/dL (ref 0.57–1.00)
GFR calc Af Amer: 106 mL/min/{1.73_m2} (ref >59)
GFR, EST NON AFRICAN AMERICAN: 92 mL/min/{1.73_m2} (ref >59)
Globulin, Total: 2.3 g/dL (ref 1.5–4.5)
Glucose: 96 mg/dL (ref 65–99)
POTASSIUM: 4.3 mmol/L (ref 3.5–5.2)
SODIUM: 140 mmol/L (ref 134–144)
Total Protein: 6.4 g/dL (ref 6.0–8.5)

## 2016-11-29 LAB — LIPID PANEL
CHOL/HDL RATIO: 2.3 ratio (ref 0.0–4.4)
CHOLESTEROL TOTAL: 136 mg/dL (ref 100–199)
HDL: 58 mg/dL (ref >39)
LDL Calculated: 68 mg/dL (ref 0–99)
TRIGLYCERIDES: 49 mg/dL (ref 0–149)
VLDL Cholesterol Cal: 10 mg/dL (ref 5–40)

## 2017-02-12 ENCOUNTER — Other Ambulatory Visit: Payer: Self-pay

## 2017-02-12 DIAGNOSIS — F3342 Major depressive disorder, recurrent, in full remission: Secondary | ICD-10-CM

## 2017-02-12 MED ORDER — ESCITALOPRAM OXALATE 10 MG PO TABS: ORAL_TABLET | ORAL | 0 refills | Status: DC

## 2017-02-24 ENCOUNTER — Encounter: Payer: Self-pay | Admitting: Family

## 2017-02-24 ENCOUNTER — Ambulatory Visit (INDEPENDENT_AMBULATORY_CARE_PROVIDER_SITE_OTHER): Payer: Medicare Other | Admitting: Family

## 2017-02-24 ENCOUNTER — Ambulatory Visit (INDEPENDENT_AMBULATORY_CARE_PROVIDER_SITE_OTHER): Payer: Medicare Other

## 2017-02-24 VITALS — BP 130/72 | HR 61 | Temp 96.9°F | Ht 62.0 in | Wt 179.6 lb

## 2017-02-24 DIAGNOSIS — R079 Chest pain, unspecified: Secondary | ICD-10-CM

## 2017-02-24 DIAGNOSIS — R059 Cough, unspecified: Secondary | ICD-10-CM

## 2017-02-24 DIAGNOSIS — R05 Cough: Secondary | ICD-10-CM | POA: Diagnosis not present

## 2017-02-24 DIAGNOSIS — F17200 Nicotine dependence, unspecified, uncomplicated: Secondary | ICD-10-CM

## 2017-02-24 DIAGNOSIS — R062 Wheezing: Secondary | ICD-10-CM

## 2017-02-24 DIAGNOSIS — K219 Gastro-esophageal reflux disease without esophagitis: Secondary | ICD-10-CM

## 2017-02-24 DIAGNOSIS — J410 Simple chronic bronchitis: Secondary | ICD-10-CM

## 2017-02-24 MED ORDER — OMEPRAZOLE 20 MG PO CPDR: 20.0000 mg | DELAYED_RELEASE_CAPSULE | Freq: Two times a day (BID) | ORAL | 1 refills | Status: DC

## 2017-02-24 NOTE — Progress Notes (Signed)
Subjective:    Patient ID: Barbara Boyd, female    DOB: 1950-10-08, 66 y.o.   MRN: [id number]  Pt presents to the office today with chest pain, GERD, cough, and fatigue. PT states she has taken Tums with mild relief of Jerrye Bushy, but continues to have chest pain.  Cough  This is a new problem. Associated symptoms include chest pain.  Chest Pain   This is a new problem. The current episode started in the past 7 days. The onset quality is sudden. The problem occurs intermittently. The problem has been unchanged. The pain is present in the substernal region. The pain is at a severity of 8/10. The pain is moderate. The quality of the pain is described as heavy and tightness. The pain radiates to the upper back. Associated symptoms include back pain, a cough and malaise/fatigue. Pertinent negatives include no exertional chest pressure, lower extremity edema or weakness. Associated symptoms comments: GERD. The cough's precipitants include smoke. The cough is productive. She has tried rest and antacids for the symptoms. The treatment provided mild relief. Risk factors include smoking/tobacco exposure.  Pertinent negatives for past medical history include no MI.      Review of Systems  Constitutional: Positive for malaise/fatigue.  Respiratory: Positive for cough.   Cardiovascular: Positive for chest pain.  Musculoskeletal: Positive for back pain.  Neurological: Negative for weakness.  All other systems reviewed and are negative.      Objective:   Physical Exam  Constitutional: She is oriented to person, place, and time. She appears well-developed and well-nourished. No distress.  HENT:  Head: Normocephalic and atraumatic.  Right Ear: External ear normal.  Mouth/Throat: Oropharynx is clear and moist.  Eyes: Pupils are equal, round, and reactive to light.  Neck: Normal range of motion. Neck supple. No thyromegaly present.  Cardiovascular: Normal rate, regular rhythm, normal heart sounds and  intact distal pulses.   No murmur heard. Pulmonary/Chest: Effort normal. No respiratory distress. She has wheezes.  Abdominal: Soft. Bowel sounds are normal. She exhibits no distension. There is no tenderness.  Musculoskeletal: Normal range of motion. She exhibits no edema or tenderness.  Neurological: She is alert and oriented to person, place, and time. She has normal reflexes. No cranial nerve deficit.  Skin: Skin is warm and dry.  Psychiatric: She has a normal mood and affect. Her behavior is normal. Judgment and thought content normal.  Vitals reviewed.  Chest x-ray- negative Preliminary reading by Evelina Dun, FNP [facility]  EKG- Sinus Bradycardia  BP 130/72   Pulse 61   Temp (!) 96.9 F (36.1 C) (Oral)   Ht 5\' 2"  (1.575 m)   Wt 179 lb 9.6 oz (81.5 kg)   SpO2 99%   BMI 32.85 kg/m       Assessment & Plan:  1. Chest pain, unspecified type - DG Chest 2 View; Future - EKG 12-Lead - Ambulatory referral to Cardiology  2. Smoker Smoking cessation - DG Chest 2 View; Future - Ambulatory referral to Cardiology  3. Cough - DG Chest 2 View; Future  4. Wheezing - DG Chest 2 View; Future - EKG 12-Lead  5. Simple chronic bronchitis ([place])  6. Gastroesophageal reflux disease, esophagitis presence not specified -Diet discussed- Avoid fried, spicy, citrus foods, caffeine and alcohol -Do not eat 2-3 hours before bedtime -Encouraged small frequent meals -Avoid NSAID's - omeprazole (PRILOSEC) 20 MG capsule; Take 1 capsule (20 mg total) by mouth 2 (two) times daily before a meal.  Dispense: 180 capsule;  Refill: 1  Long discussion about if any changes in chest pain, fatigue, SOB go to ED!!! I believe this is GERD and will start Prilosec 20 mg BID  Will do referral to Cardiologists to rule out  Smoking cessation discussed  RTO prn    Evelina Dun, FNP

## 2017-02-24 NOTE — Patient Instructions (Addendum)
Heartburn Heartburn is a type of pain or discomfort that can happen in the throat or chest. It is often described as a burning pain. It may also cause a bad taste in the mouth. Heartburn may feel worse when you lie down or bend over, and it is often worse at night. Heartburn may be caused by stomach contents that move back up into the esophagus (reflux). Follow these instructions at home: Take these actions to decrease your discomfort and to help avoid complications. Diet  Follow a diet as recommended by your health care provider. This may involve avoiding foods and drinks such as: ? Coffee and tea (with or without caffeine). ? Drinks that contain alcohol. ? Energy drinks and sports drinks. ? Carbonated drinks or sodas. ? Chocolate and cocoa. ? Peppermint and mint flavorings. ? Garlic and onions. ? Horseradish. ? Spicy and acidic foods, including peppers, chili powder, curry powder, vinegar, hot sauces, and barbecue sauce. ? Citrus fruit juices and citrus fruits, such as oranges, lemons, and limes. ? Tomato-based foods, such as red sauce, chili, salsa, and pizza with red sauce. ? Fried and fatty foods, such as donuts, french fries, potato chips, and high-fat dressings. ? High-fat meats, such as hot dogs and fatty cuts of red and white meats, such as rib eye steak, sausage, ham, and bacon. ? High-fat dairy items, such as whole milk, butter, and cream cheese.  Eat small, frequent meals instead of large meals.  Avoid drinking large amounts of liquid with your meals.  Avoid eating meals during the 2-3 hours before bedtime.  Avoid lying down right after you eat.  Do not exercise right after you eat. General instructions  Pay attention to any changes in your symptoms.  Take over-the-counter and prescription medicines only as told by your health care provider. Do not take aspirin, ibuprofen, or other NSAIDs unless your health care provider told you to do so.  Do not use any tobacco  products, including cigarettes, chewing tobacco, and e-cigarettes. If you need help quitting, ask your health care provider.  Wear loose-fitting clothing. Do not wear anything tight around your waist that causes pressure on your abdomen.  Raise (elevate) the head of your bed about 6 inches (15 cm).  Try to reduce your stress, such as with yoga or meditation. If you need help reducing stress, ask your health care provider.  If you are overweight, reduce your weight to an amount that is healthy for you. Ask your health care provider for guidance about a safe weight loss goal.  Keep all follow-up visits as told by your health care provider. This is important. Contact a health care provider if:  You have new symptoms.  You have unexplained weight loss.  You have difficulty swallowing, or it hurts to swallow.  You have wheezing or a persistent cough.  Your symptoms do not improve with treatment.  You have frequent heartburn for more than two weeks. Get help right away if:  You have pain in your arms, neck, jaw, teeth, or back.  You feel sweaty, dizzy, or light-headed.  You have chest pain or shortness of breath.  You vomit and your vomit looks like blood or coffee grounds.  Your stool is bloody or black. This information is not intended to replace advice given to you by your health care provider. Make sure you discuss any questions you have with your health care provider. Document Released: 09/22/2008 Document Revised: 10/12/2015 Document Reviewed: 08/31/2014 Elsevier Interactive Patient Education  2017 Else[employer]   Inc[employer]  Nonspecific Chest Pain Chest pain can be caused by many different conditions. There is always a chance that your pain could be related to something serious, such as a heart attack or a blood clot in your lungs. Chest pain can also be caused by conditions that are not life-threatening. If you have chest pain, it is very important to follow up with your health care  provider. What are the causes? Causes of this condition include:  Heartburn.  Pneumonia or bronchitis.  Anxiety or stress.  Inflammation around your heart (pericarditis) or lung (pleuritis or pleurisy).  A blood clot in your lung.  A collapsed lung (pneumothorax). This can develop suddenly on its own (spontaneous pneumothorax) or from trauma to the chest.  Shingles infection (varicella-zoster virus).  Heart attack.  Damage to the bones, muscles, and cartilage that make up your chest wall. This can include: ? Bruised bones due to injury. ? Strained muscles or cartilage due to frequent or repeated coughing or overwork. ? Fracture to one or more ribs. ? Sore cartilage due to inflammation (costochondritis).  What increases the risk? Risk factors for this condition may include:  Activities that increase your risk for trauma or injury to your chest.  Respiratory infections or conditions that cause frequent coughing.  Medical conditions or overeating that can cause heartburn.  Heart disease or family history of heart disease.  Conditions or health behaviors that increase your risk of developing a blood clot.  Having had chicken pox (varicella zoster).  What are the signs or symptoms? Chest pain can feel like:  Burning or tingling on the surface of your chest or deep in your chest.  Crushing, pressure, aching, or squeezing pain.  Dull or sharp pain that is worse when you move, cough, or take a deep breath.  Pain that is also felt in your back, neck, shoulder, or arm, or pain that spreads to any of these areas.  Your chest pain may come and go, or it may stay constant. How is this diagnosed? Lab tests or other studies may be needed to find the cause of your pain. Your health care provider may have you take a test called an ECG (electrocardiogram). An ECG records your heartbeat patterns at the time the test is performed. You may also have other tests, such  as:  Transthoracic echocardiogram (TTE). In this test, sound waves are used to create a picture of the heart structures and to look at how blood flows through your heart.  Transesophageal echocardiogram (TEE).This is a more advanced imaging test that takes images from inside your body. It allows your health care provider to see your heart in finer detail.  Cardiac monitoring. This allows your health care provider to monitor your heart rate and rhythm in real time.  Holter monitor. This is a portable device that records your heartbeat and can help to diagnose abnormal heartbeats. It allows your health care provider to track your heart activity for several days, if needed.  Stress tests. These can be done through exercise or by taking medicine that makes your heart beat more quickly.  Blood tests.  Other imaging tests.  How is this treated? Treatment depends on what is causing your chest pain. Treatment may include:  Medicines. These may include: ? Acid blockers for heartburn. ? Anti-inflammatory medicine. ? Pain medicine for inflammatory conditions. ? Antibiotic medicine, if an infection is present. ? Medicines to dissolve blood clots. ? Medicines to treat coronary artery disease (CAD).  Supportive care  for conditions that do not require medicines. This may include: ? Resting. ? Applying heat or cold packs to injured areas. ? Limiting activities until pain decreases.  Follow these instructions at home: Medicines  If you were prescribed an antibiotic, take it as told by your health care provider. Do not stop taking the antibiotic even if you start to feel better.  Take over-the-counter and prescription medicines only as told by your health care provider. Lifestyle  Do not use any products that contain nicotine or tobacco, such as cigarettes and e-cigarettes. If you need help quitting, ask your health care provider.  Do not drink alcohol.  Make lifestyle changes as directed  by your health care provider. These may include: ? Getting regular exercise. Ask your health care provider to suggest some activities that are safe for you. ? Eating a heart-healthy diet. A registered dietitian can help you to learn healthy eating options. ? Maintaining a healthy weight. ? Managing diabetes, if necessary. ? Reducing stress, such as with yoga or relaxation techniques. General instructions  Avoid any activities that bring on chest pain.  If heartburn is the cause for your chest pain, raise (elevate) the head of your bed about 6 inches (15 cm) by putting blocks under the legs. Sleeping with more pillows does not effectively relieve heartburn because it only changes the position of your head.  Keep all follow-up visits as told by your health care provider. This is important. This includes any further testing if your chest pain does not go away. Contact a health care provider if:  Your chest pain does not go away.  You have a rash with blisters on your chest.  You have a fever.  You have chills. Get help right away if:  Your chest pain is worse.  You have a cough that gets worse, or you cough up blood.  You have severe pain in your abdomen.  You have severe weakness.  You faint.  You have sudden, unexplained chest discomfort.  You have sudden, unexplained discomfort in your arms, back, neck, or jaw.  You have shortness of breath at any time.  You suddenly start to sweat, or your skin gets clammy.  You feel nauseous or you vomit.  You suddenly feel light-headed or dizzy.  Your heart begins to beat quickly, or it feels like it is skipping beats. These symptoms may represent a serious problem that is an emergency. Do not wait to see if the symptoms will go away. Get medical help right away. Call your local emergency services (911 in the [place] Do not drive yourself to the hospital. This information is not intended to replace advice given to you by your health  care provider. Make sure you discuss any questions you have with your health care provider. Document Released: 02/13/2005 Document Revised: 01/29/2016 Document Reviewed: 01/29/2016 Elsevier Interactive Patient Education  2017 [employer].

## 2017-02-28 ENCOUNTER — Ambulatory Visit (INDEPENDENT_AMBULATORY_CARE_PROVIDER_SITE_OTHER): Payer: Medicare Other | Admitting: Cardiovascular Disease

## 2017-02-28 ENCOUNTER — Encounter: Payer: Self-pay | Admitting: *Deleted

## 2017-02-28 ENCOUNTER — Encounter: Payer: Self-pay | Admitting: Cardiovascular Disease

## 2017-02-28 DIAGNOSIS — R079 Chest pain, unspecified: Secondary | ICD-10-CM | POA: Diagnosis not present

## 2017-02-28 NOTE — Patient Instructions (Signed)
Medication Instructions:  Your physician recommends that you continue on your current medications as directed. Please refer to the Current Medication list given to you today.   Labwork: NONE   Testing/Procedures: Your physician has requested that you have a lexiscan myoview. For further information please visit [email or web address]. Please follow instruction sheet, as given.    Follow-Up: Your physician wants you to follow-up in: 1 Year with Dr. Johnsie Cancel. You will receive a reminder letter in the mail two months in advance. If you don't receive a letter, please call our office to schedule the follow-up appointment.   Any Other Special Instructions Will Be Listed Below (If Applicable).     If you need a refill on your cardiac medications before your next appointment, please call your pharmacy.  Thank you for choosing [facility]!

## 2017-02-28 NOTE — Progress Notes (Signed)
Cardiology Office Note   Date:  02/28/2017   ID:  Barbara Boyd, DOB Apr 26, 1951, MRN [id number]  PCP:  Chevis Pretty, FNP  Cardiologist:   Jenkins Rouge, MD   No chief complaint on file.     History of Present Illness: Barbara Boyd is a 66 y.o. female who presents for chest pain. Referred by FNP Mary-Margaret  She is an active smoker with clinical COPD Smokes 2 ppd. Under a lot of stress caring for 26 yo  Son with Muscular Dystrophy with feeding tube and tracheostomy Pain is very atypical Sharp shooting Pains mostly right sided Some pleuritic nature Last seconds to minute has chronic dyspnea with fatigue No edema. Has had pains for about 2 weeks No fever sputum or LE edema CXR 02/24/17 with NAD And lung cancer screening CT ordered     Past Medical History:  Diagnosis Date  . Arthritis   . Asthma   . Cancer ([place]) 09-2013   left Br. CA, had bilat mastectomy and doing reconstruction now  . COPD (chronic obstructive pulmonary disease) ([place])   . GERD (gastroesophageal reflux disease)   . Hyperlipidemia   . Osteopenia    borderline  . Thyroid disease     Past Surgical History:  Procedure Laterality Date  . 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 BREAST RECONSTRUCTION    . COLONOSCOPY       Current Outpatient Prescriptions  Medication Sig Dispense Refill  . CALCIUM PO Take 500 mg by mouth daily.    Marland Kitchen escitalopram (LEXAPRO) 10 MG tablet TAKE 1 TABLET (10 MG TOTAL) BY MOUTH DAILY. 90 tablet 0  . Fluticasone-Salmeterol (ADVAIR) 250-50 MCG/DOSE AEPB Inhale 1 puff into the lungs every 12 (twelve) hours. 60 each 5  . methimazole (TAPAZOLE) 5 MG tablet TAKE 1/2 TABLET BY MOUTH DAILY 30 tablet 5  . mirabegron ER (MYRBETRIQ) 25 MG TB24 tablet Take 1 tablet (25 mg total) by mouth daily. 90 tablet 1  . omeprazole (PRILOSEC) 20 MG capsule Take 1 capsule (20 mg total) by mouth 2 (two) times daily before a meal. 180 capsule 1  .  simvastatin (ZOCOR) 40 MG tablet TAKE 1 TABLET (40 MG TOTAL) BY MOUTH DAILY. 30 tablet 5  . SUMAtriptan (IMITREX) 100 MG tablet Take 1 tablet (100 mg total) by mouth as needed. May repeat in 2 hours if headache persists or recurs. 9 tablet 5  . tiotropium (SPIRIVA HANDIHALER) 18 MCG inhalation capsule Place 1 capsule (18 mcg total) into inhaler and inhale daily. 30 capsule 5   No current facility-administered medications for this visit.     Allergies:   Sulfa antibiotics; Aspirin; and Penicillins    Social History:  The patient  reports that she has been smoking Cigarettes.  She started smoking about 44 years ago. She has been smoking about 2.00 packs per day. She has never used smokeless tobacco. She reports that she does not drink alcohol or use drugs.   Family History:  The patient's family history includes Arthritis in her mother; Breast cancer in her maternal aunt, maternal aunt, and paternal aunt; Dementia in her mother; Lung cancer in her father; Osteoporosis in her maternal grandmother and mother.    ROS:  Please see the history of present illness.   Otherwise, review of systems are positive for none.   All other systems are reviewed and negative.    PHYSICAL EXAM: VS:  BP 118/74 (BP Location: Right Arm)  Pulse 74   Ht 5\' 2"  (1.575 m)   Wt 179 lb (81.2 kg)   SpO2 96%   BMI 32.74 kg/m  , BMI Body mass index is 32.74 kg/m. Affect appropriate Chronically ill white femal e HEENT: normal Neck supple with no adenopathy JVP normal no bruits no thyromegaly Lungs diffuse rhonchi and exp wheezing and good diaphragmatic motion Heart:  S1/S2 no murmur, no rub, gallop or click PMI normal Abdomen: benighn, BS positve, no tenderness, no AAA no bruit.  No HSM or HJR Distal pulses intact with no bruits No edema Neuro non-focal Skin warm and dry No muscular weakness    EKG:  SR RSR' normal    Recent Labs: 05/27/2016: TSH 2.220 11/28/2016: ALT 16; BUN 7; Creatinine, Ser 0.67;  Potassium 4.3; Sodium 140    Lipid Panel    Component Value Date/Time   CHOL 136 11/28/2016 1127   CHOL 152 10/23/2012 1648   TRIG 49 11/28/2016 1127   TRIG 57 08/01/2014 0953   TRIG 67 10/23/2012 1648   HDL 58 11/28/2016 1127   HDL 66 08/01/2014 0953   HDL 59 10/23/2012 1648   CHOLHDL 2.3 11/28/2016 1127   LDLCALC 68 11/28/2016 1127   LDLCALC 114 (H) 01/31/2014 1158   LDLCALC 80 10/23/2012 1648      Wt Readings from Last 3 Encounters:  02/28/17 179 lb (81.2 kg)  02/24/17 179 lb 9.6 oz (81.5 kg)  11/28/16 178 lb (80.7 kg)      Other studies Reviewed: Additional studies/ records that were reviewed today include: Notes from primary ECG , labs CXR .    ASSESSMENT AND PLAN:  1.  Chest Pain:  Very atypical normal ECG unable to walk on treadmill due to COPD f/ Lexiscan Myovue 2. COPD: major health issue suggest lung cancer screening as ordered inhalers and smoking cessation 3. Depression related to care of son continue lexapro 4. Cholesterol :  Continue statin    Current medicines are reviewed at length with the patient today.  The patient does not have concerns regarding medicines.  The following changes have been made:  no change  Labs/ tests ordered today include: Lexiscan   Orders Placed This Encounter  Procedures  . NM Myocar Multi W/Spect W/Wall Motion / EF     Disposition:   FU with cardiology PRN      Signed, Jenkins Rouge, MD  02/28/2017 10:27 AM    [address], [place], [place]  [address] Phone: [phone]; Fax: [phone]

## 2017-03-04 ENCOUNTER — Ambulatory Visit (INDEPENDENT_AMBULATORY_CARE_PROVIDER_SITE_OTHER): Payer: Medicare Other | Admitting: Nurse Practitioner

## 2017-03-04 ENCOUNTER — Encounter: Payer: Self-pay | Admitting: Nurse Practitioner

## 2017-03-04 VITALS — BP 124/64 | HR 61 | Temp 97.3°F | Ht 62.0 in | Wt 183.0 lb

## 2017-03-04 DIAGNOSIS — Z6830 Body mass index (BMI) 30.0-30.9, adult: Secondary | ICD-10-CM | POA: Diagnosis not present

## 2017-03-04 DIAGNOSIS — M8580 Other specified disorders of bone density and structure, unspecified site: Secondary | ICD-10-CM

## 2017-03-04 DIAGNOSIS — J410 Simple chronic bronchitis: Secondary | ICD-10-CM | POA: Diagnosis not present

## 2017-03-04 DIAGNOSIS — F17200 Nicotine dependence, unspecified, uncomplicated: Secondary | ICD-10-CM

## 2017-03-04 DIAGNOSIS — G43001 Migraine without aura, not intractable, with status migrainosus: Secondary | ICD-10-CM | POA: Diagnosis not present

## 2017-03-04 DIAGNOSIS — M1990 Unspecified osteoarthritis, unspecified site: Secondary | ICD-10-CM

## 2017-03-04 DIAGNOSIS — F3342 Major depressive disorder, recurrent, in full remission: Secondary | ICD-10-CM | POA: Diagnosis not present

## 2017-03-04 DIAGNOSIS — E559 Vitamin D deficiency, unspecified: Secondary | ICD-10-CM | POA: Diagnosis not present

## 2017-03-04 DIAGNOSIS — C50912 Malignant neoplasm of unspecified site of left female breast: Secondary | ICD-10-CM

## 2017-03-04 DIAGNOSIS — E0500 Thyrotoxicosis with diffuse goiter without thyrotoxic crisis or storm: Secondary | ICD-10-CM

## 2017-03-04 DIAGNOSIS — E782 Mixed hyperlipidemia: Secondary | ICD-10-CM | POA: Diagnosis not present

## 2017-03-04 NOTE — Addendum Note (Signed)
Addended by: Chevis Pretty on: 03/04/2017 11:09 AM   Modules accepted: Orders

## 2017-03-04 NOTE — Patient Instructions (Signed)
Steps to Quit Smoking Smoking tobacco can be bad for your health. It can also affect almost every organ in your body. Smoking puts you and people around you at risk for many serious long-lasting (chronic) diseases. Quitting smoking is hard, but it is one of the best things that you can do for your health. It is never too late to quit. What are the benefits of quitting smoking? When you quit smoking, you lower your risk for getting serious diseases and conditions. They can include:  Lung cancer or lung disease.  Heart disease.  Stroke.  Heart attack.  Not being able to have children (infertility).  Weak bones (osteoporosis) and broken bones (fractures).  If you have coughing, wheezing, and shortness of breath, those symptoms may get better when you quit. You may also get sick less often. If you are pregnant, quitting smoking can help to lower your chances of having a baby of low birth weight. What can I do to help me quit smoking? Talk with your doctor about what can help you quit smoking. Some things you can do (strategies) include:  Quitting smoking totally, instead of slowly cutting back how much you smoke over a period of time.  Going to in-person counseling. You are more likely to quit if you go to many counseling sessions.  Using resources and support systems, such as: ? Online chats with a counselor. ? Phone quitlines. ? Printed self-help materials. ? Support groups or group counseling. ? Text messaging programs. ? Mobile phone apps or applications.  Taking medicines. Some of these medicines may have nicotine in them. If you are pregnant or breastfeeding, do not take any medicines to quit smoking unless your doctor says it is okay. Talk with your doctor about counseling or other things that can help you.  Talk with your doctor about using more than one strategy at the same time, such as taking medicines while you are also going to in-person counseling. This can help make  quitting easier. What things can I do to make it easier to quit? Quitting smoking might feel very hard at first, but there is a lot that you can do to make it easier. Take these steps:  Talk to your family and friends. Ask them to support and encourage you.  Call phone quitlines, reach out to support groups, or work with a counselor.  Ask people who smoke to not smoke around you.  Avoid places that make you want (trigger) to smoke, such as: ? Bars. ? Parties. ? Smoke-break areas at work.  Spend time with people who do not smoke.  Lower the stress in your life. Stress can make you want to smoke. Try these things to help your stress: ? Getting regular exercise. ? Deep-breathing exercises. ? Yoga. ? Meditating. ? Doing a body scan. To do this, close your eyes, focus on one area of your body at a time from head to toe, and notice which parts of your body are tense. Try to relax the muscles in those areas.  Download or buy apps on your mobile phone or tablet that can help you stick to your quit plan. There are many free apps, such as QuitGuide from the CDC (Centers for Disease Control and Prevention). You can find more support from smokefree.gov and other websites.  This information is not intended to replace advice given to you by your health care provider. Make sure you discuss any questions you have with your health care provider. Document Released: 03/02/2009 Document   Revised: 01/02/2016 Document Reviewed: 09/20/2014 Elsevier Interactive Patient Education  2018 Elsevier Inc.  

## 2017-03-04 NOTE — Progress Notes (Signed)
Subjective:    Patient ID: Barbara Boyd, female    DOB: Jul 16, 1950, 66 y.o.   MRN: [id number]  HPI Barbara Boyd is here today for follow up of chronic medical problem.  Outpatient Encounter Prescriptions as of 03/04/2017  Medication Sig  . CALCIUM PO Take 500 mg by mouth daily.  Marland Kitchen escitalopram (LEXAPRO) 10 MG tablet TAKE 1 TABLET (10 MG TOTAL) BY MOUTH DAILY.  Marland Kitchen Fluticasone-Salmeterol (ADVAIR) 250-50 MCG/DOSE AEPB Inhale 1 puff into the lungs every 12 (twelve) hours.  . methimazole (TAPAZOLE) 5 MG tablet TAKE 1/2 TABLET BY MOUTH DAILY  . mirabegron ER (MYRBETRIQ) 25 MG TB24 tablet Take 1 tablet (25 mg total) by mouth daily.  Marland Kitchen omeprazole (PRILOSEC) 20 MG capsule Take 1 capsule (20 mg total) by mouth 2 (two) times daily before a meal.  . simvastatin (ZOCOR) 40 MG tablet TAKE 1 TABLET (40 MG TOTAL) BY MOUTH DAILY.  . SUMAtriptan (IMITREX) 100 MG tablet Take 1 tablet (100 mg total) by mouth as needed. May repeat in 2 hours if headache persists or recurs.  Marland Kitchen tiotropium (SPIRIVA HANDIHALER) 18 MCG inhalation capsule Place 1 capsule (18 mcg total) into inhaler and inhale daily.   No facility-administered encounter medications on file as of 03/04/2017.     1. Simple chronic bronchitis ([name])  Taking Spiriva and Advair for symptom management.  Still coughing and feeling SOB, more in the morning than during the day.  2. Arthritis   3. Osteopenia, unspecified location  Taking a calcium supplement.  4. Mixed hyperlipidemia  Managed with simvastatin.  LFTs monitored regularly.  5. Infiltrating ductal carcinoma of breast, stage 1, left ([name])  Followed by oncology yearly with the next visit in December.  Treatment complete.  6. Vitamin D deficiency Not currently taking medication for this.     7. Recurrent major depressive disorder, in full remission ([name])  Managed with daily lexapro.  No new concerns.  8. Smoker  Continues to smoke 2 ppd.  Has the desire to quit, but states no willpower.   9. BMI 30.0-30.9,adult No significant weight gain or loss.   10. Migraine without aura and with status migrainosus, not intractable  Patient taking Imitrex for migraines.  States this is helpful.    11. Graves disease  Managed with methimazole.    New complaints: Patient had CP last week and was seen in the office.  Scheduled with cardiologist who set her up for a stress test (03/14/17).  Social history: Smokes greater then 2 packs of cigarettes a day and refuses to quit even though she has trouble breathing. NO longer works. Helps take care of her step dad.   Review of Systems  Constitutional: Negative for activity change and appetite change.  Respiratory: Positive for cough (intermittent, worse in the morning) and shortness of breath (worse in the morning).   Cardiovascular: Negative for palpitations.  Neurological: Negative for dizziness and light-headedness.  All other systems reviewed and are negative.      Objective:   Physical Exam  Constitutional: She is oriented to person, place, and time. She appears well-developed and well-nourished. No distress.  HENT:  Head: Normocephalic.  Right Ear: External ear normal.  Left Ear: External ear normal.  Mouth/Throat: Oropharynx is clear and moist.  Eyes: Pupils are equal, round, and reactive to light.  Neck: Normal range of motion. Neck supple. No thyromegaly present.  Cardiovascular: Normal rate, normal heart sounds and intact distal pulses.   No murmur heard. Pulmonary/Chest: Effort normal.  No respiratory distress. She has wheezes (expiratory wheezes in all lung fields).  diminished breath sounds throuhout lung fielda  Abdominal: Soft. Bowel sounds are normal. She exhibits no distension. There is no tenderness.  Musculoskeletal: Normal range of motion. She exhibits no edema.  Lymphadenopathy:    She has no cervical adenopathy.  Neurological: She is alert and oriented to person, place, and time.  Skin: Skin is warm and dry.   Psychiatric: She has a normal mood and affect. Her behavior is normal.   BP 124/64   Pulse 61   Temp (!) 97.3 F (36.3 C) (Oral)   Ht 5\' 2"  (1.575 m)   Wt 183 lb (83 kg)   BMI 33.47 kg/m     Assessment & Plan:   1. Simple chronic bronchitis ([name])   2. Arthritis   3. Osteopenia, unspecified location   4. Mixed hyperlipidemia   5. Infiltrating ductal carcinoma of breast, stage 1, left ([name])   6. Vitamin D deficiency   7. Recurrent major depressive disorder, in full remission ([name])   8. Smoker   9. BMI 30.0-30.9,adult   10. Migraine without aura and with status migrainosus, not intractable   11. Graves disease    Keep follow up appointment with cardiology Smoking cessation strongly encouraged Labs pending Health maintenance reviewed Diet and exercise encouraged Continue all meds Follow up  In 6 months   [place], FNP

## 2017-03-05 LAB — CMP14+EGFR
A/G RATIO: 1.9 (ref 1.2–2.2)
ALT: 10 IU/L (ref 0–32)
AST: 15 IU/L (ref 0–40)
Albumin: 4 g/dL (ref 3.6–4.8)
Alkaline Phosphatase: 79 IU/L (ref 39–117)
BUN/Creatinine Ratio: 16 (ref 12–28)
BUN: 9 mg/dL (ref 8–27)
Bilirubin Total: 0.2 mg/dL (ref 0.0–1.2)
CALCIUM: 8.7 mg/dL (ref 8.7–10.3)
CO2: 25 mmol/L (ref 20–29)
Chloride: 101 mmol/L (ref 96–106)
Creatinine, Ser: 0.58 mg/dL (ref 0.57–1.00)
GFR, EST AFRICAN AMERICAN: 111 mL/min/{1.73_m2} (ref >59)
GFR, EST NON AFRICAN AMERICAN: 96 mL/min/{1.73_m2} (ref >59)
GLUCOSE: 96 mg/dL (ref 65–99)
Globulin, Total: 2.1 g/dL (ref 1.5–4.5)
POTASSIUM: 4.3 mmol/L (ref 3.5–5.2)
Sodium: 139 mmol/L (ref 134–144)
TOTAL PROTEIN: 6.1 g/dL (ref 6.0–8.5)

## 2017-03-05 LAB — LIPID PANEL
CHOL/HDL RATIO: 2.6 ratio (ref 0.0–4.4)
Cholesterol, Total: 150 mg/dL (ref 100–199)
HDL: 57 mg/dL (ref >39)
LDL Calculated: 82 mg/dL (ref 0–99)
TRIGLYCERIDES: 55 mg/dL (ref 0–149)
VLDL CHOLESTEROL CAL: 11 mg/dL (ref 5–40)

## 2017-03-05 LAB — THYROID PANEL WITH TSH
FREE THYROXINE INDEX: 1.9 (ref 1.2–4.9)
T3 Uptake Ratio: 25 % (ref 24–39)
T4, Total: 7.4 ug/dL (ref 4.5–12.0)
TSH: 1.88 u[IU]/mL (ref 0.450–4.500)

## 2017-03-10 ENCOUNTER — Ambulatory Visit: Payer: Medicare Other | Admitting: *Deleted

## 2017-03-14 ENCOUNTER — Encounter (HOSPITAL_BASED_OUTPATIENT_CLINIC_OR_DEPARTMENT_OTHER)
Admission: RE | Admit: 2017-03-14 | Discharge: 2017-03-14 | Disposition: A | Discharge status: Home / Self Care | Payer: Medicare Other | Source: Ambulatory Visit | Attending: Cardiovascular Disease | Admitting: Cardiovascular Disease

## 2017-03-14 ENCOUNTER — Encounter (HOSPITAL_COMMUNITY)
Admission: RE | Admit: 2017-03-14 | Discharge: 2017-03-14 | Disposition: A | Discharge status: Home / Self Care | Payer: Medicare Other | Source: Ambulatory Visit | Attending: Cardiovascular Disease | Admitting: Cardiovascular Disease

## 2017-03-14 DIAGNOSIS — R079 Chest pain, unspecified: Secondary | ICD-10-CM

## 2017-03-14 LAB — NM MYOCAR MULTI W/SPECT W/WALL MOTION / EF
CHL CUP NUCLEAR SRS: 0
CHL CUP NUCLEAR SSS: 2
CHL CUP RESTING HR STRESS: 60 {beats}/min
LV dias vol: 63 mL (ref 46–106)
LV sys vol: 23 mL
Peak HR: 100 {beats}/min
RATE: 0.28
SDS: 2
TID: 1.05

## 2017-03-14 MED ORDER — TECHNETIUM TC 99M TETROFOSMIN IV KIT
10.0000 | PACK | Freq: Once | INTRAVENOUS | Status: AC | PRN
  Administered 2017-03-14: 10 via INTRAVENOUS

## 2017-03-14 MED ORDER — SODIUM CHLORIDE 0.9% FLUSH
INTRAVENOUS | Status: AC
  Administered 2017-03-14: 10 mL via INTRAVENOUS
  Filled 2017-03-14: qty 10

## 2017-03-14 MED ORDER — REGADENOSON 0.4 MG/5ML IV SOLN
INTRAVENOUS | Status: AC
  Administered 2017-03-14: 0.4 mg via INTRAVENOUS
  Filled 2017-03-14: qty 5

## 2017-03-14 MED ORDER — TECHNETIUM TC 99M TETROFOSMIN IV KIT
30.0000 | PACK | Freq: Once | INTRAVENOUS | Status: AC | PRN
  Administered 2017-03-14: 30 via INTRAVENOUS

## 2017-05-15 ENCOUNTER — Other Ambulatory Visit: Payer: Self-pay | Admitting: Nurse Practitioner

## 2017-05-15 ENCOUNTER — Other Ambulatory Visit: Payer: Self-pay | Admitting: *Deleted

## 2017-05-15 DIAGNOSIS — F3342 Major depressive disorder, recurrent, in full remission: Secondary | ICD-10-CM

## 2017-06-06 ENCOUNTER — Ambulatory Visit: Payer: Medicare Other | Admitting: Nurse Practitioner

## 2017-06-12 ENCOUNTER — Encounter: Payer: Self-pay | Admitting: Nurse Practitioner

## 2017-06-12 ENCOUNTER — Ambulatory Visit (INDEPENDENT_AMBULATORY_CARE_PROVIDER_SITE_OTHER): Payer: Medicare Other | Admitting: Nurse Practitioner

## 2017-06-12 VITALS — BP 120/61 | HR 64 | Temp 97.0°F | Ht 62.0 in | Wt 186.0 lb

## 2017-06-12 DIAGNOSIS — J410 Simple chronic bronchitis: Secondary | ICD-10-CM

## 2017-06-12 DIAGNOSIS — E0500 Thyrotoxicosis with diffuse goiter without thyrotoxic crisis or storm: Secondary | ICD-10-CM

## 2017-06-12 DIAGNOSIS — Z6834 Body mass index (BMI) 34.0-34.9, adult: Secondary | ICD-10-CM | POA: Diagnosis not present

## 2017-06-12 DIAGNOSIS — K219 Gastro-esophageal reflux disease without esophagitis: Secondary | ICD-10-CM | POA: Diagnosis not present

## 2017-06-12 DIAGNOSIS — N3281 Overactive bladder: Secondary | ICD-10-CM | POA: Diagnosis not present

## 2017-06-12 DIAGNOSIS — E782 Mixed hyperlipidemia: Secondary | ICD-10-CM | POA: Diagnosis not present

## 2017-06-12 DIAGNOSIS — G43001 Migraine without aura, not intractable, with status migrainosus: Secondary | ICD-10-CM | POA: Diagnosis not present

## 2017-06-12 DIAGNOSIS — F5101 Primary insomnia: Secondary | ICD-10-CM | POA: Diagnosis not present

## 2017-06-12 DIAGNOSIS — F3342 Major depressive disorder, recurrent, in full remission: Secondary | ICD-10-CM | POA: Diagnosis not present

## 2017-06-12 DIAGNOSIS — F17200 Nicotine dependence, unspecified, uncomplicated: Secondary | ICD-10-CM | POA: Diagnosis not present

## 2017-06-12 DIAGNOSIS — E559 Vitamin D deficiency, unspecified: Secondary | ICD-10-CM | POA: Diagnosis not present

## 2017-06-12 DIAGNOSIS — N3946 Mixed incontinence: Secondary | ICD-10-CM | POA: Diagnosis not present

## 2017-06-12 MED ORDER — MIRABEGRON ER 25 MG PO TB24: 25.0000 mg | ORAL_TABLET | Freq: Every day | ORAL | 1 refills | Status: DC

## 2017-06-12 MED ORDER — SUMATRIPTAN SUCCINATE 100 MG PO TABS: 100.0000 mg | ORAL_TABLET | ORAL | 5 refills | Status: DC | PRN

## 2017-06-12 MED ORDER — OMEPRAZOLE 20 MG PO CPDR: 20.0000 mg | DELAYED_RELEASE_CAPSULE | Freq: Two times a day (BID) | ORAL | 1 refills | Status: DC

## 2017-06-12 MED ORDER — SIMVASTATIN 40 MG PO TABS: ORAL_TABLET | ORAL | 5 refills | Status: DC

## 2017-06-12 MED ORDER — METHIMAZOLE 5 MG PO TABS
ORAL_TABLET | ORAL | 5 refills | Status: DC
Start: 2017-06-12 — End: 2017-12-11

## 2017-06-12 MED ORDER — TIOTROPIUM BROMIDE MONOHYDRATE 18 MCG IN CAPS: 18.0000 ug | ORAL_CAPSULE | Freq: Every day | RESPIRATORY_TRACT | 5 refills | Status: DC

## 2017-06-12 MED ORDER — FLUTICASONE-SALMETEROL 250-50 MCG/DOSE IN AEPB: 1.0000 | INHALATION_SPRAY | Freq: Two times a day (BID) | RESPIRATORY_TRACT | 5 refills | Status: DC

## 2017-06-12 MED ORDER — ESCITALOPRAM OXALATE 20 MG PO TABS
20.0000 mg | ORAL_TABLET | Freq: Every day | ORAL | 5 refills | Status: DC
Start: 2017-06-12 — End: 2017-08-07

## 2017-06-12 NOTE — Progress Notes (Addendum)
Subjective:    Patient ID: Barbara Boyd, female    DOB: 1951-02-02, 67 y.o.   MRN: [id number]  HPI  Barbara Boyd is here today for follow up of chronic medical problem.  Outpatient Encounter Medications as of 06/12/2017  Medication Sig  . CALCIUM PO Take 500 mg by mouth daily.  Marland Kitchen escitalopram (LEXAPRO) 10 MG tablet TAKE 1 TABLET BY MOUTH EVERY DAY  . Fluticasone-Salmeterol (ADVAIR) 250-50 MCG/DOSE AEPB Inhale 1 puff into the lungs every 12 (twelve) hours.  . methimazole (TAPAZOLE) 5 MG tablet TAKE 1/2 TABLET BY MOUTH DAILY  . mirabegron ER (MYRBETRIQ) 25 MG TB24 tablet Take 1 tablet (25 mg total) by mouth daily.  Marland Kitchen omeprazole (PRILOSEC) 20 MG capsule Take 1 capsule (20 mg total) by mouth 2 (two) times daily before a meal.  . simvastatin (ZOCOR) 40 MG tablet TAKE 1 TABLET (40 MG TOTAL) BY MOUTH DAILY.  . SUMAtriptan (IMITREX) 100 MG tablet Take 1 tablet (100 mg total) by mouth as needed. May repeat in 2 hours if headache persists or recurs.  Marland Kitchen tiotropium (SPIRIVA HANDIHALER) 18 MCG inhalation capsule Place 1 capsule (18 mcg total) into inhaler and inhale daily.     1. Migraine without aura and with status migrainosus, not intractable  Has not had one in several months. She is usually able  To treat with oTC meds.  2. Simple chronic bronchitis ([facility])  Still smoking and has chronic cough.   3. Graves disease  No complaints that she is aware of. It is time to recheck labs  4. Mixed hyperlipidemia  Not watching diet at all  5. Vitamin D deficiency  Does not take viatmin d every day  6. Recurrent major depressive disorder, in full remission ([place])  Feels like she is depressed. Has a handicap child that requires 24/7 care and sh is not able to get out of house much.  7. Smoker  Says that she just cannot quit. She has tried everything and is just not able to quit.  8. Primary insomnia  Wakes up frequently at night  9. BMI 30.0-30.9,adult  Weight continues to increase    New  complaints: Nothing other then depression has worsened  Social history: Divorced- lives with handicap child and takes care of him   Review of Systems  Constitutional: Negative for activity change and appetite change.  HENT: Negative.   Eyes: Negative for pain.  Respiratory: Negative for shortness of breath.   Cardiovascular: Negative for chest pain, palpitations and leg swelling.  Gastrointestinal: Negative for abdominal pain.  Endocrine: Negative for polydipsia.  Genitourinary: Negative.   Skin: Negative for rash.  Neurological: Negative for dizziness, weakness and headaches.  Hematological: Does not bruise/bleed easily.  Psychiatric/Behavioral: Negative.   All other systems reviewed and are negative.      Objective:   Physical Exam  Constitutional: She is oriented to person, place, and time. She appears well-developed and well-nourished.  HENT:  Nose: Nose normal.  Mouth/Throat: Oropharynx is clear and moist.  Eyes: EOM are normal.  Neck: Trachea normal, normal range of motion and full passive range of motion without pain. Neck supple. No JVD present. Carotid bruit is not present. No thyromegaly present.  Cardiovascular: Normal rate, regular rhythm, normal heart sounds and intact distal pulses. Exam reveals no gallop and no friction rub.  No murmur heard. Pulmonary/Chest: Effort normal and breath sounds normal.  Abdominal: Soft. Bowel sounds are normal. She exhibits no distension and no mass. There is no  tenderness.  Musculoskeletal: Normal range of motion.  Lymphadenopathy:    She has no cervical adenopathy.  Neurological: She is alert and oriented to person, place, and time. She has normal reflexes.  Skin: Skin is warm and dry.  Psychiatric: She has a normal mood and affect. Her behavior is normal. Judgment and thought content normal.    BP 120/61   Pulse 64   Temp (!) 97 F (36.1 C) (Oral)   Ht 5' 2"  (1.575 m)   Wt 186 lb (84.4 kg)   BMI 34.02 kg/m        Assessment & Plan:  1. Migraine without aura and with status migrainosus, not intractable Keep diary of migraines - SUMAtriptan (IMITREX) 100 MG tablet; Take 1 tablet (100 mg total) by mouth as needed. May repeat in 2 hours if headache persists or recurs.  Dispense: 9 tablet; Refill: 5  2. Simple chronic bronchitis ([facility]) Continue daily inhalers - tiotropium (SPIRIVA HANDIHALER) 18 MCG inhalation capsule; Place 1 capsule (18 mcg total) into inhaler and inhale daily.  Dispense: 30 capsule; Refill: 5 - Fluticasone-Salmeterol (ADVAIR) 250-50 MCG/DOSE AEPB; Inhale 1 puff into the lungs every 12 (twelve) hours.  Dispense: 60 each; Refill: 5  3. Graves disease - methimazole (TAPAZOLE) 5 MG tablet; TAKE 1/2 TABLET BY MOUTH DAILY  Dispense: 30 tablet; Refill: 5  4. Mixed hyperlipidemia Low fat diet encouraged - simvastatin (ZOCOR) 40 MG tablet; TAKE 1 TABLET (40 MG TOTAL) BY MOUTH DAILY.  Dispense: 30 tablet; Refill: 5  5. Vitamin D deficiency labs pending  6. Recurrent major depressive disorder, in full remission ([place]) Increased lexapro to 77m daily  7. Smoker Smoking cessation encouraged  8. Primary insomnia Try melatonin nightly  9. BMI 34.0-34.9,adult Discussed diet and exercise for person with BMI >25 Will recheck weight in 3-6 months  10. Gastroesophageal reflux disease, esophagitis presence not specified Avoid spicy foods Do not eat 2 hours prior to bedtime - omeprazole (PRILOSEC) 20 MG capsule; Take 1 capsule (20 mg total) by mouth 2 (two) times daily before a meal.  Dispense: 180 capsule; Refill: 1  11. OAB (overactive bladder) - mirabegron ER (MYRBETRIQ) 25 MG TB24 tablet; Take 1 tablet (25 mg total) by mouth daily.  Dispense: 90 tablet; Refill: 1  12. Urge and stress incontinence - mirabegron ER (MYRBETRIQ) 25 MG TB24 tablet; Take 1 tablet (25 mg total) by mouth daily.  Dispense: 90 tablet; Refill: 1  Orders Placed This Encounter  Procedures  . CMP14+EGFR  . Lipid  panel  . Thyroid Panel With TSH     Labs pending Health maintenance reviewed Diet and exercise encouraged Continue all meds Follow up  In 6 months   [place] FNP

## 2017-06-12 NOTE — Patient Instructions (Signed)
Stress and Stress Management Stress is a normal reaction to life events. It is what you feel when life demands more than you are used to or more than you can handle. Some stress can be useful. For example, the stress reaction can help you catch the last bus of the day, study for a test, or meet a deadline at work. But stress that occurs too often or for too long can cause problems. It can affect your emotional health and interfere with relationships and normal daily activities. Too much stress can weaken your immune system and increase your risk for physical illness. If you already have a medical problem, stress can make it worse. What are the causes? All sorts of life events may cause stress. An event that causes stress for one person may not be stressful for another person. Major life events commonly cause stress. These may be positive or negative. Examples include losing your job, moving into a new home, getting married, having a baby, or losing a loved one. Less obvious life events may also cause stress, especially if they occur day after day or in combination. Examples include working long hours, driving in traffic, caring for children, being in debt, or being in a difficult relationship. What are the signs or symptoms? Stress may cause emotional symptoms including, the following:  Anxiety. This is feeling worried, afraid, on edge, overwhelmed, or out of control.  Anger. This is feeling irritated or impatient.  Depression. This is feeling sad, down, helpless, or guilty.  Difficulty focusing, remembering, or making decisions.  Stress may cause physical symptoms, including the following:  Aches and pains. These may affect your head, neck, back, stomach, or other areas of your body.  Tight muscles or clenched jaw.  Low energy or trouble sleeping.  Stress may cause unhealthy behaviors, including the following:  Eating to feel better (overeating) or skipping meals.  Sleeping too little,  too much, or both.  Working too much or putting off tasks (procrastination).  Smoking, drinking alcohol, or using drugs to feel better.  How is this diagnosed? Stress is diagnosed through an assessment by your health care provider. Your health care provider will ask questions about your symptoms and any stressful life events.Your health care provider will also ask about your medical history and may order blood tests or other tests. Certain medical conditions and medicine can cause physical symptoms similar to stress. Mental illness can cause emotional symptoms and unhealthy behaviors similar to stress. Your health care provider may refer you to a mental health professional for further evaluation. How is this treated? Stress management is the recommended treatment for stress.The goals of stress management are reducing stressful life events and coping with stress in healthy ways. Techniques for reducing stressful life events include the following:  Stress identification. Self-monitor for stress and identify what causes stress for you. These skills may help you to avoid some stressful events.  Time management. Set your priorities, keep a calendar of events, and learn to say "no." These tools can help you avoid making too many commitments.  Techniques for coping with stress include the following:  Rethinking the problem. Try to think realistically about stressful events rather than ignoring them or overreacting. Try to find the positives in a stressful situation rather than focusing on the negatives.  Exercise. Physical exercise can release both physical and emotional tension. The key is to find a form of exercise you enjoy and do it regularly.  Relaxation techniques. These relax the body and  mind. Examples include yoga, meditation, tai chi, biofeedback, deep breathing, progressive muscle relaxation, listening to music, being out in nature, journaling, and other hobbies. Again, the key is to find  one or more that you enjoy and can do regularly.  Healthy lifestyle. Eat a balanced diet, get plenty of sleep, and do not smoke. Avoid using alcohol or drugs to relax.  Strong support network. Spend time with family, friends, or other people you enjoy being around.Express your feelings and talk things over with someone you trust.  Counseling or talktherapy with a mental health professional may be helpful if you are having difficulty managing stress on your own. Medicine is typically not recommended for the treatment of stress.Talk to your health care provider if you think you need medicine for symptoms of stress. Follow these instructions at home:  Keep all follow-up visits as directed by your health care provider.  Take all medicines as directed by your health care provider. Contact a health care provider if:  Your symptoms get worse or you start having new symptoms.  You feel overwhelmed by your problems and can no longer manage them on your own. Get help right away if:  You feel like hurting yourself or someone else. This information is not intended to replace advice given to you by your health care provider. Make sure you discuss any questions you have with your health care provider. Document Released: 10/30/2000 Document Revised: 10/12/2015 Document Reviewed: 12/29/2012 Elsevier Interactive Patient Education  2017 [employer]  

## 2017-06-12 NOTE — Addendum Note (Signed)
Addended by: Chevis Pretty on: 06/12/2017 10:55 AM   Modules accepted: Orders

## 2017-06-13 LAB — LIPID PANEL
CHOL/HDL RATIO: 2.8 ratio (ref 0.0–4.4)
Cholesterol, Total: 173 mg/dL (ref 100–199)
HDL: 61 mg/dL (ref >39)
LDL Calculated: 101 mg/dL — ABNORMAL HIGH (ref 0–99)
Triglycerides: 57 mg/dL (ref 0–149)
VLDL CHOLESTEROL CAL: 11 mg/dL (ref 5–40)

## 2017-06-13 LAB — THYROID PANEL WITH TSH
Free Thyroxine Index: 1.6 (ref 1.2–4.9)
T3 Uptake Ratio: 25 % (ref 24–39)
T4, Total: 6.5 ug/dL (ref 4.5–12.0)
TSH: 2.51 u[IU]/mL (ref 0.450–4.500)

## 2017-06-13 LAB — CMP14+EGFR
ALT: 14 IU/L (ref 0–32)
AST: 19 IU/L (ref 0–40)
Albumin/Globulin Ratio: 1.8 (ref 1.2–2.2)
Albumin: 3.9 g/dL (ref 3.6–4.8)
Alkaline Phosphatase: 80 IU/L (ref 39–117)
BUN/Creatinine Ratio: 14 (ref 12–28)
BUN: 10 mg/dL (ref 8–27)
Bilirubin Total: 0.3 mg/dL (ref 0.0–1.2)
CALCIUM: 9.1 mg/dL (ref 8.7–10.3)
CHLORIDE: 98 mmol/L (ref 96–106)
CO2: 24 mmol/L (ref 20–29)
CREATININE: 0.74 mg/dL (ref 0.57–1.00)
GFR calc Af Amer: 98 mL/min/{1.73_m2} (ref >59)
GFR, EST NON AFRICAN AMERICAN: 85 mL/min/{1.73_m2} (ref >59)
GLUCOSE: 97 mg/dL (ref 65–99)
Globulin, Total: 2.2 g/dL (ref 1.5–4.5)
Potassium: 4.1 mmol/L (ref 3.5–5.2)
Sodium: 139 mmol/L (ref 134–144)
Total Protein: 6.1 g/dL (ref 6.0–8.5)

## 2017-06-27 DIAGNOSIS — Z170 Estrogen receptor positive status [ER+]: Secondary | ICD-10-CM | POA: Diagnosis not present

## 2017-06-27 DIAGNOSIS — Z9013 Acquired absence of bilateral breasts and nipples: Secondary | ICD-10-CM | POA: Diagnosis not present

## 2017-06-27 DIAGNOSIS — F17200 Nicotine dependence, unspecified, uncomplicated: Secondary | ICD-10-CM | POA: Diagnosis not present

## 2017-06-27 DIAGNOSIS — Z853 Personal history of malignant neoplasm of breast: Secondary | ICD-10-CM | POA: Diagnosis not present

## 2017-06-27 DIAGNOSIS — C50412 Malignant neoplasm of upper-outer quadrant of left female breast: Secondary | ICD-10-CM | POA: Diagnosis not present

## 2017-06-27 DIAGNOSIS — Z9882 Breast implant status: Secondary | ICD-10-CM | POA: Diagnosis not present

## 2017-06-27 DIAGNOSIS — Z08 Encounter for follow-up examination after completed treatment for malignant neoplasm: Secondary | ICD-10-CM | POA: Diagnosis not present

## 2017-06-27 DIAGNOSIS — Z803 Family history of malignant neoplasm of breast: Secondary | ICD-10-CM | POA: Diagnosis not present

## 2017-07-16 DIAGNOSIS — L814 Other melanin hyperpigmentation: Secondary | ICD-10-CM | POA: Diagnosis not present

## 2017-07-16 DIAGNOSIS — D1800 Hemangioma unspecified site: Secondary | ICD-10-CM | POA: Diagnosis not present

## 2017-07-16 DIAGNOSIS — L570 Actinic keratosis: Secondary | ICD-10-CM | POA: Diagnosis not present

## 2017-07-16 DIAGNOSIS — L723 Sebaceous cyst: Secondary | ICD-10-CM | POA: Diagnosis not present

## 2017-07-31 DIAGNOSIS — H2513 Age-related nuclear cataract, bilateral: Secondary | ICD-10-CM | POA: Diagnosis not present

## 2017-07-31 NOTE — Progress Notes (Signed)
Patient had benign cyst seen last year and wanted repeated- was never done- please find out why not done

## 2017-08-07 ENCOUNTER — Other Ambulatory Visit: Payer: Self-pay | Admitting: *Deleted

## 2017-08-07 MED ORDER — ESCITALOPRAM OXALATE 20 MG PO TABS: 20.0000 mg | ORAL_TABLET | Freq: Every day | ORAL | 0 refills | Status: DC

## 2017-09-26 ENCOUNTER — Ambulatory Visit (INDEPENDENT_AMBULATORY_CARE_PROVIDER_SITE_OTHER): Payer: Medicare Other | Admitting: Family Medicine

## 2017-09-26 ENCOUNTER — Encounter: Payer: Self-pay | Admitting: Family Medicine

## 2017-09-26 VITALS — BP 103/56 | HR 66 | Temp 97.1°F | Ht 62.0 in | Wt 183.0 lb

## 2017-09-26 DIAGNOSIS — L03311 Cellulitis of abdominal wall: Secondary | ICD-10-CM | POA: Diagnosis not present

## 2017-09-26 DIAGNOSIS — W57XXXA Bitten or stung by nonvenomous insect and other nonvenomous arthropods, initial encounter: Secondary | ICD-10-CM | POA: Diagnosis not present

## 2017-09-26 DIAGNOSIS — J449 Chronic obstructive pulmonary disease, unspecified: Secondary | ICD-10-CM | POA: Diagnosis not present

## 2017-09-26 DIAGNOSIS — R6889 Other general symptoms and signs: Secondary | ICD-10-CM | POA: Diagnosis not present

## 2017-09-26 MED ORDER — DOXYCYCLINE HYCLATE 100 MG PO TABS: 100.0000 mg | ORAL_TABLET | Freq: Two times a day (BID) | ORAL | 0 refills | Status: DC

## 2017-09-26 NOTE — Progress Notes (Signed)
Chief Complaint  Patient presents with  . Tick Removal    pt here today c/o tick bite last week and now has a rash around where the tick was and feels "sick"    HPI  Patient presents today for patient has a red swollen area where she removed a deer tick on May 2.  She does not know how long the tick was in place prior to removal.  However over the last few days she is noted decreased energy.  There is been no fever chills or sweats.  She is noticed no rash except for the one at the site that she presents for today.  No arthralgias.  PMH: Smoking status noted ROS: Per HPI  Objective: BP (!) 103/56   Pulse 66   Temp (!) 97.1 F (36.2 C) (Oral)   Ht 5\' 2"  (1.575 m)   Wt 183 lb (83 kg)   BMI 33.47 kg/m  Gen: NAD, alert, cooperative with exam HEENT: NCAT, EOMI, PERRL CV: RRR, good S1/S2, no murmur Resp: CTABL, no wheezes, non-labored Abd: SNTND, there is on the right side of the flap fold a 2 x 3 cm erythematous lesion with a central pinpoint nidus.  This is blanching.  It is nonindurated and nonfluctuant Ext: No edema, warm Neuro: Alert and oriented, No gross deficits  Assessment and plan:  1. Tick bite, initial encounter   2. Cellulitis, abdominal wall     Meds ordered this encounter  Medications  . doxycycline (VIBRA-TABS) 100 MG tablet    Sig: Take 1 tablet (100 mg total) by mouth 2 (two) times daily.    Dispense:  20 tablet    Refill:  0    Orders Placed This Encounter  Procedures  . Lyme Ab/Western Blot Reflex  . CBC with Differential/Platelet    Follow up as needed.  Claretta Fraise, MD

## 2017-09-29 LAB — CBC WITH DIFFERENTIAL/PLATELET
BASOS ABS: 0 10*3/uL (ref 0.0–0.2)
Basos: 1 %
EOS (ABSOLUTE): 0.2 10*3/uL (ref 0.0–0.4)
Eos: 3 %
HEMOGLOBIN: 14.2 g/dL (ref 11.1–15.9)
Hematocrit: 41.6 % (ref 34.0–46.6)
IMMATURE GRANS (ABS): 0 10*3/uL (ref 0.0–0.1)
IMMATURE GRANULOCYTES: 0 %
LYMPHS: 35 %
Lymphocytes Absolute: 2 10*3/uL (ref 0.7–3.1)
MCH: 32.4 pg (ref 26.6–33.0)
MCHC: 34.1 g/dL (ref 31.5–35.7)
MCV: 95 fL (ref 79–97)
MONOCYTES: 19 %
Monocytes Absolute: 1.1 10*3/uL — ABNORMAL HIGH (ref 0.1–0.9)
NEUTROS ABS: 2.4 10*3/uL (ref 1.4–7.0)
Neutrophils: 42 %
Platelets: 284 10*3/uL (ref 150–379)
RBC: 4.38 x10E6/uL (ref 3.77–5.28)
RDW: 13.8 % (ref 12.3–15.4)
WBC: 5.6 10*3/uL (ref 3.4–10.8)

## 2017-09-29 LAB — LYME AB/WESTERN BLOT REFLEX: Lyme IgG/IgM Ab: <0.91 {ISR} (ref 0.00–0.90)

## 2017-10-14 DIAGNOSIS — L821 Other seborrheic keratosis: Secondary | ICD-10-CM | POA: Diagnosis not present

## 2017-10-23 DIAGNOSIS — H40013 Open angle with borderline findings, low risk, bilateral: Secondary | ICD-10-CM | POA: Diagnosis not present

## 2017-10-23 DIAGNOSIS — H25013 Cortical age-related cataract, bilateral: Secondary | ICD-10-CM | POA: Diagnosis not present

## 2017-10-23 DIAGNOSIS — H2513 Age-related nuclear cataract, bilateral: Secondary | ICD-10-CM | POA: Diagnosis not present

## 2017-10-23 DIAGNOSIS — H5213 Myopia, bilateral: Secondary | ICD-10-CM | POA: Diagnosis not present

## 2017-11-01 ENCOUNTER — Other Ambulatory Visit: Payer: Self-pay | Admitting: Nurse Practitioner

## 2017-12-11 ENCOUNTER — Ambulatory Visit (INDEPENDENT_AMBULATORY_CARE_PROVIDER_SITE_OTHER): Payer: Medicare Other | Admitting: Nurse Practitioner

## 2017-12-11 ENCOUNTER — Encounter: Payer: Self-pay | Admitting: Nurse Practitioner

## 2017-12-11 VITALS — BP 133/78 | HR 69 | Temp 97.1°F | Ht 62.0 in | Wt 180.0 lb

## 2017-12-11 DIAGNOSIS — F3342 Major depressive disorder, recurrent, in full remission: Secondary | ICD-10-CM

## 2017-12-11 DIAGNOSIS — F5101 Primary insomnia: Secondary | ICD-10-CM

## 2017-12-11 DIAGNOSIS — K219 Gastro-esophageal reflux disease without esophagitis: Secondary | ICD-10-CM | POA: Diagnosis not present

## 2017-12-11 DIAGNOSIS — E0500 Thyrotoxicosis with diffuse goiter without thyrotoxic crisis or storm: Secondary | ICD-10-CM | POA: Diagnosis not present

## 2017-12-11 DIAGNOSIS — E782 Mixed hyperlipidemia: Secondary | ICD-10-CM | POA: Diagnosis not present

## 2017-12-11 DIAGNOSIS — J449 Chronic obstructive pulmonary disease, unspecified: Secondary | ICD-10-CM | POA: Diagnosis not present

## 2017-12-11 DIAGNOSIS — F17200 Nicotine dependence, unspecified, uncomplicated: Secondary | ICD-10-CM

## 2017-12-11 DIAGNOSIS — C50912 Malignant neoplasm of unspecified site of left female breast: Secondary | ICD-10-CM

## 2017-12-11 DIAGNOSIS — R3915 Urgency of urination: Secondary | ICD-10-CM | POA: Diagnosis not present

## 2017-12-11 DIAGNOSIS — Z6830 Body mass index (BMI) 30.0-30.9, adult: Secondary | ICD-10-CM

## 2017-12-11 DIAGNOSIS — M8580 Other specified disorders of bone density and structure, unspecified site: Secondary | ICD-10-CM | POA: Diagnosis not present

## 2017-12-11 DIAGNOSIS — E559 Vitamin D deficiency, unspecified: Secondary | ICD-10-CM | POA: Diagnosis not present

## 2017-12-11 MED ORDER — FLUTICASONE-SALMETEROL 250-50 MCG/DOSE IN AEPB: 1.0000 | INHALATION_SPRAY | Freq: Two times a day (BID) | RESPIRATORY_TRACT | 5 refills | Status: DC

## 2017-12-11 MED ORDER — ESCITALOPRAM OXALATE 20 MG PO TABS: 20.0000 mg | ORAL_TABLET | Freq: Every day | ORAL | 1 refills | Status: DC

## 2017-12-11 MED ORDER — METHIMAZOLE 5 MG PO TABS: ORAL_TABLET | ORAL | 5 refills | Status: DC

## 2017-12-11 MED ORDER — OMEPRAZOLE 20 MG PO CPDR
20.0000 mg | DELAYED_RELEASE_CAPSULE | Freq: Two times a day (BID) | ORAL | 1 refills | Status: DC
Start: 2017-12-11 — End: 2018-06-16

## 2017-12-11 MED ORDER — TIOTROPIUM BROMIDE MONOHYDRATE 18 MCG IN CAPS: 18.0000 ug | ORAL_CAPSULE | Freq: Every day | RESPIRATORY_TRACT | 5 refills | Status: DC

## 2017-12-11 MED ORDER — SIMVASTATIN 40 MG PO TABS: ORAL_TABLET | ORAL | 5 refills | Status: DC

## 2017-12-11 MED ORDER — TOLTERODINE TARTRATE ER 4 MG PO CP24: 4.0000 mg | ORAL_CAPSULE | Freq: Every day | ORAL | 1 refills | Status: DC

## 2017-12-11 NOTE — Patient Instructions (Signed)

## 2017-12-11 NOTE — Progress Notes (Signed)
Subjective:    Patient ID: Barbara Boyd, female    DOB: [birth year], 67 y.o.   MRN: [id number]   Chief Complaint: Medical Management of Chronic Issues   HPI:  1.  COPD with chronic bronchitis ([place])  Patient has occasional cough. She is till a smoker. Uses advair and spirivia inhaler daily. Has wheezing all the time.  2. Graves disease  Is on methimazole daily- denies any episodes of heart racing.  3. Osteopenia, unspecified location  Last bmd test was done 04/27/14 with tscore of -1.7 . She is not taking daly calcium supplement. Does no weight bearing exercises.  4. Vitamin D deficiency  Has not been taking vitamin d supplement. Will recheck labs today  5. Smoker  Patient would like to quit but is afraid she will gain weight. Smokes at least 2 packs a day.  6. Primary insomnia  Sleeps 5-6 hours a night. Is currently not taking anything to sleep  7. Mixed hyperlipidemia  Not watching diet at all and refuses to take statin  8. Recurrent major depressive disorder, in full remission ([place])  Is on a daily does of lexapro which she says is working well. Depression screen [facility] 2/9 12/11/2017 06/12/2017 03/04/2017  Decreased Interest _0 Down, Depressed, Hopeless 0 1 1  PHQ - 2 Score _1 Altered sleeping - 1 1  Tired, decreased energy - 1 1  Change in appetite - 1 1  Feeling bad or failure about yourself  - 0 0  Trouble concentrating - 0 0  Moving slowly or fidgety/restless - 0 0  Suicidal thoughts - 0 0  PHQ-9 Score - 6 5  Difficult doing work/chores - Not difficult at all -  Some recent data might be hidden     9. BMI 30.0-30.9,adult weight is down 3lbs from last visit.  10. Infiltrating ductal carcinoma of breast, stage 1, left ([place])  Had follow up with oncology on 06/27/17. Was doing well. No signs of reoccurence. Has follow up again in august.  11.    Urinary urgency           Is on myrbetriq which is working well but is very expensive and would                like to try  something else. 12.     gerd           Takes omeprazole daly to keep symptoms under control    Outpatient Encounter Medications as of 12/11/2017  Medication Sig  . escitalopram (LEXAPRO) 20 MG tablet TAKE 1 TABLET BY MOUTH EVERY DAY  . Fluticasone-Salmeterol (ADVAIR) 250-50 MCG/DOSE AEPB Inhale 1 puff into the lungs every 12 (twelve) hours.  . methimazole (TAPAZOLE) 5 MG tablet TAKE 1/2 TABLET BY MOUTH DAILY  . mirabegron ER (MYRBETRIQ) 25 MG TB24 tablet Take 1 tablet (25 mg total) by mouth daily.  Marland Kitchen omeprazole (PRILOSEC) 20 MG capsule Take 1 capsule (20 mg total) by mouth 2 (two) times daily before a meal.  . simvastatin (ZOCOR) 40 MG tablet TAKE 1 TABLET (40 MG TOTAL) BY MOUTH DAILY.  . SUMAtriptan (IMITREX) 100 MG tablet Take 1 tablet (100 mg total) by mouth as needed. May repeat in 2 hours if headache persists or recurs.  Marland Kitchen tiotropium (SPIRIVA HANDIHALER) 18 MCG inhalation capsule Place 1 capsule (18 mcg total) into inhaler and inhale daily.  Marland Kitchen CALCIUM PO Take 500 mg by mouth daily.  New complaints: none  Social history: Lives with her handicap son that she is the primary care giver. Has [place] nurse helping from 7-3 daily.    Review of Systems  Constitutional: Negative for activity change and appetite change.  HENT: Negative.   Eyes: Negative for pain.  Respiratory: Negative for shortness of breath.   Cardiovascular: Negative for chest pain, palpitations and leg swelling.  Gastrointestinal: Negative for abdominal pain.  Endocrine: Negative for polydipsia.  Genitourinary: Negative.   Skin: Negative for rash.  Neurological: Negative for dizziness, weakness and headaches.  Hematological: Does not bruise/bleed easily.  Psychiatric/Behavioral: Negative.   All other systems reviewed and are negative.      Objective:   Physical Exam  Constitutional: She is oriented to person, place, and time. She appears well-developed and well-nourished. No distress.  HENT:    Head: Normocephalic.  Nose: Nose normal.  Mouth/Throat: Oropharynx is clear and moist.  Eyes: Pupils are equal, round, and reactive to light. EOM are normal.  Neck: Normal range of motion. Neck supple. No JVD present. Carotid bruit is not present.  Cardiovascular: Normal rate, regular rhythm, normal heart sounds and intact distal pulses.  Pulmonary/Chest: Effort normal. No respiratory distress. She has wheezes (tight exp wheezes throughut with occasional rhonchi). She has no rales. She exhibits no tenderness.  Abdominal: Soft. Normal appearance, normal aorta and bowel sounds are normal. She exhibits no distension, no abdominal bruit, no pulsatile midline mass and no mass. There is no splenomegaly or hepatomegaly. There is no tenderness.  Musculoskeletal: Normal range of motion. She exhibits no edema.  Lymphadenopathy:    She has no cervical adenopathy.  Neurological: She is alert and oriented to person, place, and time. She has normal reflexes.  Skin: Skin is warm and dry.  Psychiatric: She has a normal mood and affect. Her behavior is normal. Judgment and thought content normal.  Nursing note and vitals reviewed.  BP 133/78   Pulse 69   Temp (!) 97.1 F (36.2 C) (Oral)   Ht _0  (1.575 m)   Wt 180 lb (81.6 kg)   BMI 32.92 kg/m       Assessment & Plan:  Michael Ventresca Smejkal comes in today with chief complaint of Medical Management of Chronic Issues   Diagnosis and orders addressed:  1. Graves disease Avoid caffeine - methimazole (TAPAZOLE) 5 MG tablet; TAKE 1/2 TABLET BY MOUTH DAILY  Dispense: 30 tablet; Refill: 5 - Thyroid Panel With TSH  2. Osteopenia, unspecified location Weight bearing exercises encouraged Patient areed to dexascan at next visit  3. Vitamin D deficiency Need to take daily siupplement - VITAMIN D 25 Hydroxy (Vit-D Deficiency, Fractures)  4. Smoker Encouraged smoking cessation  5. Primary insomnia Bedtime routine  6. Mixed hyperlipidemia Low fat  diet - simvastatin (ZOCOR) 40 MG tablet; TAKE 1 TABLET (40 MG TOTAL) BY MOUTH DAILY.  Dispense: 30 tablet; Refill: 5 - CMP14+EGFR - Lipid panel  7. Recurrent major depressive disorder, in full remission ([place]) Stress management - escitalopram (LEXAPRO) 20 MG tablet; Take 1 tablet (20 mg total) by mouth daily.  Dispense: 90 tablet; Refill: 1  8. BMI 30.0-30.9,adult Discussed diet and exercise for person with BMI >25 Will recheck weight in 3-6 months  9. Infiltrating ductal carcinoma of breast, stage 1, left ([facility]) Keep follow up with oncology  10. Gastroesophageal reflux disease, esophagitis presence not specified Avoid spicy foods Do not eat 2 hours prior to bedtime - omeprazole (PRILOSEC) 20 MG capsule; Take 1 capsule (  20 mg total) by mouth 2 (two) times daily before a meal.  Dispense: 180 capsule; Refill: 1  11. COPD with chronic bronchitis ([place]) Again really nbeed sto stop smoking or at least cut back - tiotropium (SPIRIVA HANDIHALER) 18 MCG inhalation capsule; Place 1 capsule (18 mcg total) into inhaler and inhale daily.  Dispense: 30 capsule; Refill: 5 - Fluticasone-Salmeterol (ADVAIR) 250-50 MCG/DOSE AEPB; Inhale 1 puff into the lungs every 12 (twelve) hours.  Dispense: 60 each; Refill: 5  12. Urinary urgency Patient wanted to change myrbetriq due to expense- rx detrol LA- she willl let me know if not working - tolterodine (DETROL LA) 4 MG 24 hr capsule; Take 1 capsule (4 mg total) by mouth daily.  Dispense: 90 capsule; Refill: 1    Labs pending Health Maintenance reviewed Diet and exercise encouraged  Follow up plan: 3 months   [name], FNP

## 2017-12-12 LAB — LIPID PANEL
CHOL/HDL RATIO: 3.1 ratio (ref 0.0–4.4)
Cholesterol, Total: 189 mg/dL (ref 100–199)
HDL: 61 mg/dL (ref >39)
LDL Calculated: 115 mg/dL — ABNORMAL HIGH (ref 0–99)
Triglycerides: 64 mg/dL (ref 0–149)
VLDL CHOLESTEROL CAL: 13 mg/dL (ref 5–40)

## 2017-12-12 LAB — CMP14+EGFR
ALBUMIN: 4.2 g/dL (ref 3.6–4.8)
ALT: 13 IU/L (ref 0–32)
AST: 19 IU/L (ref 0–40)
Albumin/Globulin Ratio: 1.7 (ref 1.2–2.2)
Alkaline Phosphatase: 80 IU/L (ref 39–117)
BUN / CREAT RATIO: 14 (ref 12–28)
BUN: 10 mg/dL (ref 8–27)
CALCIUM: 9.8 mg/dL (ref 8.7–10.3)
CHLORIDE: 97 mmol/L (ref 96–106)
CO2: 24 mmol/L (ref 20–29)
Creatinine, Ser: 0.7 mg/dL (ref 0.57–1.00)
GFR, EST AFRICAN AMERICAN: 104 mL/min/{1.73_m2} (ref >59)
GFR, EST NON AFRICAN AMERICAN: 90 mL/min/{1.73_m2} (ref >59)
Globulin, Total: 2.5 g/dL (ref 1.5–4.5)
Glucose: 101 mg/dL — ABNORMAL HIGH (ref 65–99)
Potassium: 4.4 mmol/L (ref 3.5–5.2)
Sodium: 138 mmol/L (ref 134–144)
TOTAL PROTEIN: 6.7 g/dL (ref 6.0–8.5)

## 2017-12-12 LAB — VITAMIN D 25 HYDROXY (VIT D DEFICIENCY, FRACTURES): VIT D 25 HYDROXY: 25.3 ng/mL — AB (ref 30.0–100.0)

## 2017-12-12 LAB — THYROID PANEL WITH TSH
Free Thyroxine Index: 2.5 (ref 1.2–4.9)
T3 Uptake Ratio: 28 % (ref 24–39)
T4, Total: 8.8 ug/dL (ref 4.5–12.0)
TSH: 1.7 u[IU]/mL (ref 0.450–4.500)

## 2017-12-25 DIAGNOSIS — F17218 Nicotine dependence, cigarettes, with other nicotine-induced disorders: Secondary | ICD-10-CM | POA: Diagnosis not present

## 2017-12-25 DIAGNOSIS — F17210 Nicotine dependence, cigarettes, uncomplicated: Secondary | ICD-10-CM | POA: Diagnosis not present

## 2017-12-25 DIAGNOSIS — Z9882 Breast implant status: Secondary | ICD-10-CM | POA: Diagnosis not present

## 2017-12-25 DIAGNOSIS — Z08 Encounter for follow-up examination after completed treatment for malignant neoplasm: Secondary | ICD-10-CM | POA: Diagnosis not present

## 2017-12-25 DIAGNOSIS — Z9013 Acquired absence of bilateral breasts and nipples: Secondary | ICD-10-CM | POA: Insufficient documentation

## 2017-12-25 DIAGNOSIS — Z806 Family history of leukemia: Secondary | ICD-10-CM | POA: Diagnosis not present

## 2017-12-25 DIAGNOSIS — Z853 Personal history of malignant neoplasm of breast: Secondary | ICD-10-CM | POA: Diagnosis not present

## 2017-12-25 DIAGNOSIS — Z9889 Other specified postprocedural states: Secondary | ICD-10-CM | POA: Insufficient documentation

## 2017-12-25 DIAGNOSIS — R911 Solitary pulmonary nodule: Secondary | ICD-10-CM | POA: Diagnosis not present

## 2017-12-25 DIAGNOSIS — Z803 Family history of malignant neoplasm of breast: Secondary | ICD-10-CM | POA: Diagnosis not present

## 2017-12-25 DIAGNOSIS — C50412 Malignant neoplasm of upper-outer quadrant of left female breast: Secondary | ICD-10-CM | POA: Diagnosis not present

## 2017-12-25 DIAGNOSIS — Z716 Tobacco abuse counseling: Secondary | ICD-10-CM | POA: Diagnosis not present

## 2017-12-25 DIAGNOSIS — Z801 Family history of malignant neoplasm of trachea, bronchus and lung: Secondary | ICD-10-CM | POA: Diagnosis not present

## 2017-12-25 DIAGNOSIS — Z170 Estrogen receptor positive status [ER+]: Secondary | ICD-10-CM | POA: Diagnosis not present

## 2018-01-07 DIAGNOSIS — H40013 Open angle with borderline findings, low risk, bilateral: Secondary | ICD-10-CM | POA: Diagnosis not present

## 2018-01-07 DIAGNOSIS — H25013 Cortical age-related cataract, bilateral: Secondary | ICD-10-CM | POA: Diagnosis not present

## 2018-01-07 DIAGNOSIS — H2513 Age-related nuclear cataract, bilateral: Secondary | ICD-10-CM | POA: Diagnosis not present

## 2018-01-27 DIAGNOSIS — Z23 Encounter for immunization: Secondary | ICD-10-CM | POA: Diagnosis not present

## 2018-02-13 DIAGNOSIS — L700 Acne vulgaris: Secondary | ICD-10-CM | POA: Diagnosis not present

## 2018-02-13 DIAGNOSIS — L299 Pruritus, unspecified: Secondary | ICD-10-CM | POA: Diagnosis not present

## 2018-02-13 DIAGNOSIS — L309 Dermatitis, unspecified: Secondary | ICD-10-CM | POA: Diagnosis not present

## 2018-03-11 DIAGNOSIS — H40013 Open angle with borderline findings, low risk, bilateral: Secondary | ICD-10-CM | POA: Diagnosis not present

## 2018-03-11 DIAGNOSIS — H25013 Cortical age-related cataract, bilateral: Secondary | ICD-10-CM | POA: Diagnosis not present

## 2018-03-11 DIAGNOSIS — H2513 Age-related nuclear cataract, bilateral: Secondary | ICD-10-CM | POA: Diagnosis not present

## 2018-03-11 DIAGNOSIS — H5213 Myopia, bilateral: Secondary | ICD-10-CM | POA: Diagnosis not present

## 2018-04-07 DIAGNOSIS — H52222 Regular astigmatism, left eye: Secondary | ICD-10-CM | POA: Diagnosis not present

## 2018-04-07 DIAGNOSIS — H2512 Age-related nuclear cataract, left eye: Secondary | ICD-10-CM | POA: Diagnosis not present

## 2018-04-07 DIAGNOSIS — H25812 Combined forms of age-related cataract, left eye: Secondary | ICD-10-CM | POA: Diagnosis not present

## 2018-04-27 DIAGNOSIS — H2511 Age-related nuclear cataract, right eye: Secondary | ICD-10-CM | POA: Diagnosis not present

## 2018-04-27 DIAGNOSIS — H25011 Cortical age-related cataract, right eye: Secondary | ICD-10-CM | POA: Diagnosis not present

## 2018-05-26 DIAGNOSIS — H2511 Age-related nuclear cataract, right eye: Secondary | ICD-10-CM | POA: Diagnosis not present

## 2018-05-26 DIAGNOSIS — H52221 Regular astigmatism, right eye: Secondary | ICD-10-CM | POA: Diagnosis not present

## 2018-05-26 DIAGNOSIS — H25811 Combined forms of age-related cataract, right eye: Secondary | ICD-10-CM | POA: Diagnosis not present

## 2018-06-01 ENCOUNTER — Other Ambulatory Visit: Payer: Self-pay | Admitting: Nurse Practitioner

## 2018-06-01 DIAGNOSIS — R3915 Urgency of urination: Secondary | ICD-10-CM

## 2018-06-01 NOTE — Telephone Encounter (Signed)
Last seen 7/19   

## 2018-06-16 ENCOUNTER — Encounter: Payer: Self-pay | Admitting: Nurse Practitioner

## 2018-06-16 ENCOUNTER — Ambulatory Visit (INDEPENDENT_AMBULATORY_CARE_PROVIDER_SITE_OTHER): Payer: Medicare Other | Admitting: Nurse Practitioner

## 2018-06-16 VITALS — BP 134/73 | HR 76 | Temp 97.0°F | Ht 62.0 in | Wt 194.0 lb

## 2018-06-16 DIAGNOSIS — G43001 Migraine without aura, not intractable, with status migrainosus: Secondary | ICD-10-CM | POA: Diagnosis not present

## 2018-06-16 DIAGNOSIS — K219 Gastro-esophageal reflux disease without esophagitis: Secondary | ICD-10-CM

## 2018-06-16 DIAGNOSIS — J449 Chronic obstructive pulmonary disease, unspecified: Secondary | ICD-10-CM | POA: Diagnosis not present

## 2018-06-16 DIAGNOSIS — F5101 Primary insomnia: Secondary | ICD-10-CM

## 2018-06-16 DIAGNOSIS — F17200 Nicotine dependence, unspecified, uncomplicated: Secondary | ICD-10-CM | POA: Diagnosis not present

## 2018-06-16 DIAGNOSIS — E782 Mixed hyperlipidemia: Secondary | ICD-10-CM

## 2018-06-16 DIAGNOSIS — M8580 Other specified disorders of bone density and structure, unspecified site: Secondary | ICD-10-CM | POA: Diagnosis not present

## 2018-06-16 DIAGNOSIS — C50912 Malignant neoplasm of unspecified site of left female breast: Secondary | ICD-10-CM

## 2018-06-16 DIAGNOSIS — E0500 Thyrotoxicosis with diffuse goiter without thyrotoxic crisis or storm: Secondary | ICD-10-CM | POA: Diagnosis not present

## 2018-06-16 DIAGNOSIS — F3342 Major depressive disorder, recurrent, in full remission: Secondary | ICD-10-CM | POA: Diagnosis not present

## 2018-06-16 DIAGNOSIS — E559 Vitamin D deficiency, unspecified: Secondary | ICD-10-CM | POA: Diagnosis not present

## 2018-06-16 DIAGNOSIS — R3915 Urgency of urination: Secondary | ICD-10-CM

## 2018-06-16 MED ORDER — FLUTICASONE-SALMETEROL 250-50 MCG/DOSE IN AEPB: 1.0000 | INHALATION_SPRAY | Freq: Two times a day (BID) | RESPIRATORY_TRACT | 5 refills | Status: DC

## 2018-06-16 MED ORDER — METHIMAZOLE 5 MG PO TABS
ORAL_TABLET | ORAL | 5 refills | Status: DC
Start: 2018-06-16 — End: 2018-10-22

## 2018-06-16 MED ORDER — SIMVASTATIN 40 MG PO TABS
ORAL_TABLET | ORAL | 5 refills | Status: DC
Start: 2018-06-16 — End: 2018-10-22

## 2018-06-16 MED ORDER — OMEPRAZOLE 20 MG PO CPDR: 20.0000 mg | DELAYED_RELEASE_CAPSULE | Freq: Two times a day (BID) | ORAL | 1 refills | Status: DC

## 2018-06-16 MED ORDER — TOLTERODINE TARTRATE ER 4 MG PO CP24
ORAL_CAPSULE | ORAL | 1 refills | Status: DC
Start: 2018-06-16 — End: 2018-10-22

## 2018-06-16 MED ORDER — ESCITALOPRAM OXALATE 20 MG PO TABS: 20.0000 mg | ORAL_TABLET | Freq: Every day | ORAL | 1 refills | Status: DC

## 2018-06-16 MED ORDER — ZOLPIDEM TARTRATE 10 MG PO TABS: 10.0000 mg | ORAL_TABLET | Freq: Every evening | ORAL | 1 refills | Status: DC | PRN

## 2018-06-16 MED ORDER — TIOTROPIUM BROMIDE MONOHYDRATE 18 MCG IN CAPS: 18.0000 ug | ORAL_CAPSULE | Freq: Every day | RESPIRATORY_TRACT | 5 refills | Status: DC

## 2018-06-16 NOTE — Patient Instructions (Signed)

## 2018-06-16 NOTE — Progress Notes (Addendum)
Subjective:    Patient ID: Barbara Boyd, female    DOB: 01/30/1951, 68 y.o.   MRN: [id number]   Chief Complaint: Medical Management of Chronic Issues   HPI:  1. Migraine without aura and with status migrainosus, not intractable  Use imitrex when needed. Has migraine 2-3 x a month  2. COPD with chronic bronchitis ([place])  Is currently on advair and spirivia.says she os doing ok. Still has daily cough.  3. Graves disease  Is on daily methimazole and denies any palpitations.  4. Osteopenia, unspecified location  Last dexascan was done 04/2018. patient refuses to repeat. She does no weight bearing exercise.  5. Vitamin D deficiency  She does not take her vitamin d and calcium everyday. Says she just forgets  To take it.  6. Smoker  Still smoking over a pack a day.  7. Primary insomnia  She is not doing well with slepp. Has trouble falling asleep and staying asleep. She has used [place] in the past.  8. Mixed hyperlipidemia  Does not watch diet and does no exercise. Is on zocor. Says lipitor and crestor cause myalgia  9. Recurrent major depressive disorder, in full remission [facility])  She has been on lexapro for several years. Says it is working well for her. She is under a lot of stress and it helps keep her calm. Depression screen [facility] 2/9 06/16/2018 12/11/2017 06/12/2017  Decreased Interest 0 1 2  Down, Depressed, Hopeless 0 0 1  PHQ - 2 Score 0 1 3  Altered sleeping - - 1  Tired, decreased energy - - 1  Change in appetite - - 1  Feeling bad or failure about yourself  - - 0  Trouble concentrating - - 0  Moving slowly or fidgety/restless - - 0  Suicidal thoughts - - 0  PHQ-9 Score - - 6  Difficult doing work/chores - - Not difficult at all  Some recent data might be hidden     10. BMI 30.0-30.9,adult  Weight is up 14lbs  11. Infiltrating ductal carcinoma of breast, stage 1, left ([place])  Had last followup with oncology on 12/25/17. They had long discussion with her about smoking and  she is to follow up with them in 6 months    Outpatient Encounter Medications as of 06/16/2018  Medication Sig  . CALCIUM PO Take 500 mg by mouth daily.  Marland Kitchen escitalopram (LEXAPRO) 20 MG tablet Take 1 tablet (20 mg total) by mouth daily.  . Fluticasone-Salmeterol (ADVAIR) 250-50 MCG/DOSE AEPB Inhale 1 puff into the lungs every 12 (twelve) hours.  . methimazole (TAPAZOLE) 5 MG tablet TAKE 1/2 TABLET BY MOUTH DAILY  . omeprazole (PRILOSEC) 20 MG capsule Take 1 capsule (20 mg total) by mouth 2 (two) times daily before a meal.  . simvastatin (ZOCOR) 40 MG tablet TAKE 1 TABLET (40 MG TOTAL) BY MOUTH DAILY.  . SUMAtriptan (IMITREX) 100 MG tablet Take 1 tablet (100 mg total) by mouth as needed. May repeat in 2 hours if headache persists or recurs.  Marland Kitchen tiotropium (SPIRIVA HANDIHALER) 18 MCG inhalation capsule Place 1 capsule (18 mcg total) into inhaler and inhale daily.  Marland Kitchen tolterodine (DETROL LA) 4 MG 24 hr capsule TAKE 1 CAPSULE BY MOUTH EVERY DAY       New complaints: None today  Social history: Lives with her husband   Review of Systems  Constitutional: Negative for activity change and appetite change.  HENT: Negative.   Eyes: Negative for pain.  Respiratory: Negative  for shortness of breath.   Cardiovascular: Negative for chest pain, palpitations and leg swelling.  Gastrointestinal: Negative for abdominal pain.  Endocrine: Negative for polydipsia.  Genitourinary: Negative.   Skin: Negative for rash.  Neurological: Negative for dizziness, weakness and headaches.  Hematological: Does not bruise/bleed easily.  Psychiatric/Behavioral: Negative.   All other systems reviewed and are negative.      Objective:   Physical Exam Vitals signs and nursing note reviewed.  Constitutional:      General: She is not in acute distress.    Appearance: Normal appearance. She is well-developed.  HENT:     Head: Normocephalic.     Nose: Nose normal.  Eyes:     Pupils: Pupils are equal, round,  and reactive to light.  Neck:     Musculoskeletal: Normal range of motion and neck supple.     Vascular: No carotid bruit or JVD.  Cardiovascular:     Rate and Rhythm: Normal rate and regular rhythm.     Heart sounds: Normal heart sounds.  Pulmonary:     Effort: Pulmonary effort is normal. No respiratory distress.     Breath sounds: Normal breath sounds. No wheezing or rales.  Chest:     Chest wall: No tenderness.  Abdominal:     General: Bowel sounds are normal. There is no distension or abdominal bruit.     Palpations: Abdomen is soft. There is no hepatomegaly, splenomegaly, mass or pulsatile mass.     Tenderness: There is no abdominal tenderness.  Musculoskeletal: Normal range of motion.  Lymphadenopathy:     Cervical: No cervical adenopathy.  Skin:    General: Skin is warm and dry.  Neurological:     Mental Status: She is alert and oriented to person, place, and time.     Deep Tendon Reflexes: Reflexes are normal and symmetric.  Psychiatric:        Behavior: Behavior normal.        Thought Content: Thought content normal.        Judgment: Judgment normal.    BP 134/73   Pulse 76   Temp (!) 97 F (36.1 C) (Oral)   Ht 5' 2"  (1.575 m)   Wt 194 lb (88 kg)   BMI 35.48 kg/m         Assessment & Plan:  Quinnlyn A Engelbrecht comes in today with chief complaint of Medical Management of Chronic Issues (weight gain)   Diagnosis and orders addressed:  1. Migraine without aura and with status migrainosus, not intractable Avoid caffeine  2. COPD with chronic bronchitis ([place]) Smoking cessation encouraged - tiotropium (SPIRIVA HANDIHALER) 18 MCG inhalation capsule; Place 1 capsule (18 mcg total) into inhaler and inhale daily.  Dispense: 30 capsule; Refill: 5 - Fluticasone-Salmeterol (ADVAIR) 250-50 MCG/DOSE AEPB; Inhale 1 puff into the lungs every 12 (twelve) hours.  Dispense: 60 each; Refill: 5  3. Graves disease Labs pending - methimazole (TAPAZOLE) 5 MG tablet; TAKE 1/2  TABLET BY MOUTH DAILY  Dispense: 30 tablet; Refill: 5 - Thyroid Panel With TSH  4. Osteopenia, unspecified location Weight bearing exercises encouraged  5. Vitamin D deficiency Get back on vitamin d   6. Smoker Again smoking cessation encouraged  7. Primary insomnia ambien 40m 1 po qhs #30 2 refills  8. Mixed hyperlipidemia Low fat diet - simvastatin (ZOCOR) 40 MG tablet; TAKE 1 TABLET (40 MG TOTAL) BY MOUTH DAILY.  Dispense: 30 tablet; Refill: 5 - CMP14+EGFR - Lipid panel  9. Recurrent major depressive  disorder, in full remission ([place]) Stress management - escitalopram (LEXAPRO) 20 MG tablet; Take 1 tablet (20 mg total) by mouth daily.  Dispense: 90 tablet; Refill: 1  10. morbib obesity Discussed diet and exercise for person with BMI >25 Will recheck weight in 3-6 months  11. Infiltrating ductal carcinoma of breast, stage 1, left ([place]) Continue oncology every 6 months  12. Gastroesophageal reflux disease, esophagitis presence not specified Avoid spicy foods Do not eat 2 hours prior to bedtime - omeprazole (PRILOSEC) 20 MG capsule; Take 1 capsule (20 mg total) by mouth 2 (two) times daily before a meal.  Dispense: 180 capsule; Refill: 1  13. Urinary urgency - tolterodine (DETROL LA) 4 MG 24 hr capsule; 90TAKE 1 CAPSULE BY MOUTH EVERY DAY  Dispense: 90 capsule; Refill: 1   Labs pending Health Maintenance reviewed Diet and exercise encouraged  Follow up plan: 3 months   [name], FNP

## 2018-06-17 LAB — CMP14+EGFR
ALK PHOS: 84 IU/L (ref 39–117)
ALT: 11 IU/L (ref 0–32)
AST: 17 IU/L (ref 0–40)
Albumin/Globulin Ratio: 1.6 (ref 1.2–2.2)
Albumin: 4 g/dL (ref 3.8–4.8)
BUN/Creatinine Ratio: 16 (ref 12–28)
BUN: 12 mg/dL (ref 8–27)
Bilirubin Total: 0.3 mg/dL (ref 0.0–1.2)
CO2: 21 mmol/L (ref 20–29)
CREATININE: 0.73 mg/dL (ref 0.57–1.00)
Calcium: 9 mg/dL (ref 8.7–10.3)
Chloride: 100 mmol/L (ref 96–106)
GFR calc Af Amer: 99 mL/min/{1.73_m2} (ref >59)
GFR calc non Af Amer: 85 mL/min/{1.73_m2} (ref >59)
GLOBULIN, TOTAL: 2.5 g/dL (ref 1.5–4.5)
GLUCOSE: 94 mg/dL (ref 65–99)
Potassium: 4.4 mmol/L (ref 3.5–5.2)
SODIUM: 137 mmol/L (ref 134–144)
Total Protein: 6.5 g/dL (ref 6.0–8.5)

## 2018-06-17 LAB — LIPID PANEL
CHOLESTEROL TOTAL: 188 mg/dL (ref 100–199)
Chol/HDL Ratio: 3.2 ratio (ref 0.0–4.4)
HDL: 59 mg/dL (ref >39)
LDL CALC: 116 mg/dL — AB (ref 0–99)
TRIGLYCERIDES: 65 mg/dL (ref 0–149)
VLDL Cholesterol Cal: 13 mg/dL (ref 5–40)

## 2018-06-17 LAB — THYROID PANEL WITH TSH
FREE THYROXINE INDEX: 2.1 (ref 1.2–4.9)
T3 UPTAKE RATIO: 26 % (ref 24–39)
T4, Total: 8 ug/dL (ref 4.5–12.0)
TSH: 2.17 u[IU]/mL (ref 0.450–4.500)

## 2018-06-29 IMAGING — NM NM MYOCAR MULTI W/SPECT W/WALL MOTION & EF
2 series · 12 of 12 positions shown · non-contrast
Comparison: none

[Series 1: rest · 6.51mm/px · 6 of 64 frames shown]
[frame 6/64]
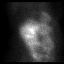
[frame 16/64]
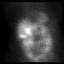
[frame 27/64]
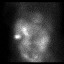
[frame 38/64]
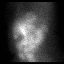
[frame 48/64]
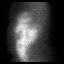
[frame 59/64]
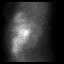

[Series 2: stress gated · 6.51mm/px · 6 of 64 frames shown]
[frame 6/64]
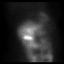
[frame 16/64]
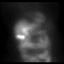
[frame 27/64]
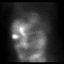
[frame 38/64]
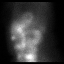
[frame 48/64]
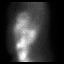
[frame 59/64]
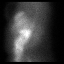

[12 of 12 positions shown; findings below may reference images not displayed]

Canned report from images found in remote index.

Refer to host system for actual result text.

## 2018-07-08 DIAGNOSIS — L853 Xerosis cutis: Secondary | ICD-10-CM | POA: Diagnosis not present

## 2018-07-08 DIAGNOSIS — L814 Other melanin hyperpigmentation: Secondary | ICD-10-CM | POA: Diagnosis not present

## 2018-07-08 DIAGNOSIS — L570 Actinic keratosis: Secondary | ICD-10-CM | POA: Diagnosis not present

## 2018-07-08 DIAGNOSIS — I781 Nevus, non-neoplastic: Secondary | ICD-10-CM | POA: Diagnosis not present

## 2018-07-28 DIAGNOSIS — L03031 Cellulitis of right toe: Secondary | ICD-10-CM | POA: Diagnosis not present

## 2018-08-04 DIAGNOSIS — Z170 Estrogen receptor positive status [ER+]: Secondary | ICD-10-CM | POA: Diagnosis not present

## 2018-08-04 DIAGNOSIS — F17218 Nicotine dependence, cigarettes, with other nicotine-induced disorders: Secondary | ICD-10-CM | POA: Diagnosis not present

## 2018-08-04 DIAGNOSIS — Z9013 Acquired absence of bilateral breasts and nipples: Secondary | ICD-10-CM | POA: Diagnosis not present

## 2018-08-04 DIAGNOSIS — Z08 Encounter for follow-up examination after completed treatment for malignant neoplasm: Secondary | ICD-10-CM | POA: Diagnosis not present

## 2018-08-04 DIAGNOSIS — Z716 Tobacco abuse counseling: Secondary | ICD-10-CM | POA: Diagnosis not present

## 2018-08-04 DIAGNOSIS — C50412 Malignant neoplasm of upper-outer quadrant of left female breast: Secondary | ICD-10-CM | POA: Diagnosis not present

## 2018-08-04 DIAGNOSIS — Z853 Personal history of malignant neoplasm of breast: Secondary | ICD-10-CM | POA: Diagnosis not present

## 2018-08-04 DIAGNOSIS — R911 Solitary pulmonary nodule: Secondary | ICD-10-CM | POA: Diagnosis not present

## 2018-08-11 DIAGNOSIS — M79674 Pain in right toe(s): Secondary | ICD-10-CM | POA: Diagnosis not present

## 2018-08-11 DIAGNOSIS — L03031 Cellulitis of right toe: Secondary | ICD-10-CM | POA: Diagnosis not present

## 2018-09-24 ENCOUNTER — Ambulatory Visit: Payer: Medicare Other | Admitting: Nurse Practitioner

## 2018-10-22 ENCOUNTER — Encounter: Payer: Self-pay | Admitting: Nurse Practitioner

## 2018-10-22 ENCOUNTER — Other Ambulatory Visit: Payer: Self-pay

## 2018-10-22 ENCOUNTER — Ambulatory Visit (INDEPENDENT_AMBULATORY_CARE_PROVIDER_SITE_OTHER): Payer: Medicare Other | Admitting: Nurse Practitioner

## 2018-10-22 VITALS — BP 128/69 | HR 72 | Temp 97.7°F | Ht 62.0 in | Wt 191.0 lb

## 2018-10-22 DIAGNOSIS — M8580 Other specified disorders of bone density and structure, unspecified site: Secondary | ICD-10-CM | POA: Diagnosis not present

## 2018-10-22 DIAGNOSIS — F3342 Major depressive disorder, recurrent, in full remission: Secondary | ICD-10-CM | POA: Diagnosis not present

## 2018-10-22 DIAGNOSIS — C50912 Malignant neoplasm of unspecified site of left female breast: Secondary | ICD-10-CM

## 2018-10-22 DIAGNOSIS — E782 Mixed hyperlipidemia: Secondary | ICD-10-CM

## 2018-10-22 DIAGNOSIS — R3915 Urgency of urination: Secondary | ICD-10-CM | POA: Diagnosis not present

## 2018-10-22 DIAGNOSIS — Z6830 Body mass index (BMI) 30.0-30.9, adult: Secondary | ICD-10-CM

## 2018-10-22 DIAGNOSIS — F5101 Primary insomnia: Secondary | ICD-10-CM

## 2018-10-22 DIAGNOSIS — E0500 Thyrotoxicosis with diffuse goiter without thyrotoxic crisis or storm: Secondary | ICD-10-CM

## 2018-10-22 DIAGNOSIS — K219 Gastro-esophageal reflux disease without esophagitis: Secondary | ICD-10-CM

## 2018-10-22 DIAGNOSIS — R911 Solitary pulmonary nodule: Secondary | ICD-10-CM | POA: Diagnosis not present

## 2018-10-22 DIAGNOSIS — Z0000 Encounter for general adult medical examination without abnormal findings: Secondary | ICD-10-CM | POA: Diagnosis not present

## 2018-10-22 DIAGNOSIS — F17200 Nicotine dependence, unspecified, uncomplicated: Secondary | ICD-10-CM

## 2018-10-22 DIAGNOSIS — E559 Vitamin D deficiency, unspecified: Secondary | ICD-10-CM

## 2018-10-22 DIAGNOSIS — J4489 Other specified chronic obstructive pulmonary disease: Secondary | ICD-10-CM

## 2018-10-22 DIAGNOSIS — J449 Chronic obstructive pulmonary disease, unspecified: Secondary | ICD-10-CM | POA: Diagnosis not present

## 2018-10-22 DIAGNOSIS — R87610 Atypical squamous cells of undetermined significance on cytologic smear of cervix (ASC-US): Secondary | ICD-10-CM | POA: Diagnosis not present

## 2018-10-22 LAB — URINALYSIS, COMPLETE
Bilirubin, UA: NEGATIVE
Glucose, UA: NEGATIVE
Ketones, UA: NEGATIVE
Leukocytes,UA: NEGATIVE
Nitrite, UA: NEGATIVE
Protein,UA: NEGATIVE
Specific Gravity, UA: 1.02 (ref 1.005–1.030)
Urobilinogen, Ur: 0.2 mg/dL (ref 0.2–1.0)
pH, UA: 7.5 (ref 5.0–7.5)

## 2018-10-22 LAB — MICROSCOPIC EXAMINATION
Bacteria, UA: NONE SEEN
Epithelial Cells (non renal): >10 /hpf — AB (ref 0–10)
Renal Epithel, UA: NONE SEEN /hpf

## 2018-10-22 MED ORDER — FLUTICASONE-SALMETEROL 250-50 MCG/DOSE IN AEPB: 1.0000 | INHALATION_SPRAY | Freq: Two times a day (BID) | RESPIRATORY_TRACT | 5 refills | Status: DC

## 2018-10-22 MED ORDER — SIMVASTATIN 40 MG PO TABS: ORAL_TABLET | ORAL | 5 refills | Status: DC

## 2018-10-22 MED ORDER — OMEPRAZOLE 20 MG PO CPDR: 20.0000 mg | DELAYED_RELEASE_CAPSULE | Freq: Two times a day (BID) | ORAL | 1 refills | Status: DC

## 2018-10-22 MED ORDER — TOLTERODINE TARTRATE ER 4 MG PO CP24: ORAL_CAPSULE | ORAL | 1 refills | Status: DC

## 2018-10-22 MED ORDER — METHIMAZOLE 5 MG PO TABS: ORAL_TABLET | ORAL | 5 refills | Status: DC

## 2018-10-22 MED ORDER — ESCITALOPRAM OXALATE 20 MG PO TABS: 20.0000 mg | ORAL_TABLET | Freq: Every day | ORAL | 1 refills | Status: DC

## 2018-10-22 MED ORDER — TIOTROPIUM BROMIDE MONOHYDRATE 18 MCG IN CAPS: 18.0000 ug | ORAL_CAPSULE | Freq: Every day | RESPIRATORY_TRACT | 5 refills | Status: DC

## 2018-10-22 NOTE — Patient Instructions (Signed)
Steps to Quit Smoking    Smoking tobacco can be bad for your health. It can also affect almost every organ in your body. Smoking puts you and people around you at risk for many serious long-lasting (chronic) diseases. Quitting smoking is hard, but it is one of the best things that you can do for your health. It is never too late to quit.  What are the benefits of quitting smoking?  When you quit smoking, you lower your risk for getting serious diseases and conditions. They can include:  · Lung cancer or lung disease.  · Heart disease.  · Stroke.  · Heart attack.  · Not being able to have children (infertility).  · Weak bones (osteoporosis) and broken bones (fractures).  If you have coughing, wheezing, and shortness of breath, those symptoms may get better when you quit. You may also get sick less often. If you are pregnant, quitting smoking can help to lower your chances of having a baby of low birth weight.  What can I do to help me quit smoking?  Talk with your doctor about what can help you quit smoking. Some things you can do (strategies) include:  · Quitting smoking totally, instead of slowly cutting back how much you smoke over a period of time.  · Going to in-person counseling. You are more likely to quit if you go to many counseling sessions.  · Using resources and support systems, such as:  ? Online chats with a counselor.  ? Phone quitlines.  ? Printed self-help materials.  ? Support groups or group counseling.  ? Text messaging programs.  ? Mobile phone apps or applications.  · Taking medicines. Some of these medicines may have nicotine in them. If you are pregnant or breastfeeding, do not take any medicines to quit smoking unless your doctor says it is okay. Talk with your doctor about counseling or other things that can help you.  Talk with your doctor about using more than one strategy at the same time, such as taking medicines while you are also going to in-person counseling. This can help make  quitting easier.  What things can I do to make it easier to quit?  Quitting smoking might feel very hard at first, but there is a lot that you can do to make it easier. Take these steps:  · Talk to your family and friends. Ask them to support and encourage you.  · Call phone quitlines, reach out to support groups, or work with a counselor.  · Ask people who smoke to not smoke around you.  · Avoid places that make you want (trigger) to smoke, such as:  ? Bars.  ? Parties.  ? Smoke-break areas at work.  · Spend time with people who do not smoke.  · Lower the stress in your life. Stress can make you want to smoke. Try these things to help your stress:  ? Getting regular exercise.  ? Deep-breathing exercises.  ? Yoga.  ? Meditating.  ? Doing a body scan. To do this, close your eyes, focus on one area of your body at a time from head to toe, and notice which parts of your body are tense. Try to relax the muscles in those areas.  · Download or buy apps on your mobile phone or tablet that can help you stick to your quit plan. There are many free apps, such as QuitGuide from the CDC (Centers for Disease Control and Prevention). You can find more   support from smokefree.gov and other websites.  This information is not intended to replace advice given to you by your health care provider. Make sure you discuss any questions you have with your health care provider.  Document Released: 03/02/2009 Document Revised: 01/02/2016 Document Reviewed: 09/20/2014  Elsevier Interactive Patient Education © 2019 [employer]

## 2018-10-22 NOTE — Progress Notes (Signed)
Subjective:    Patient ID: Barbara Boyd, female    DOB: 01-22-1951, 68 y.o.   MRN: [id number]   Chief Complaint: Annual Exam    HPI:  1. Annual physical exam  2. Mixed hyperlipidemia Not watching diet and no exercise. Forgets to take because she takes it at night  3. COPD with chronic bronchitis ([place]) Uses spirvia and advair daily. Is doing [employer] [place]. No wheezing but has chronic cough.  4. Smoker Still smoking over a pack a day  5. Graves disease No problems that she is aware of.  6. Recurrent major depressive disorder, in full remission ([place]) Is on lexapro. Says she feels ok. This covid and staying cooped up makes her stress level higher  7. Primary insomnia Takes ambien on occasion to sleep . Still wakes up at night but she says she will deal with it.  8. Vitamin D deficiency Takes vitamin d when she remembers, which is not very often  52. Urinary urgency Is on detrol LA which helps some. Insurance will not pay for other meds  10. Infiltrating ductal carcinoma of breast, stage 1, left [facility]) She sees oncology yearly and is doing well . No reoccurence  11. Osteopenia, unspecified location Last dexascan was in December of 2015. Will repeat once covid is over.  12. BMI 30.0-30.9,adult No recent weight changes    Outpatient Encounter Medications as of 10/22/2018  Medication Sig  . CALCIUM PO Take 500 mg by mouth daily.  Marland Kitchen escitalopram (LEXAPRO) 20 MG tablet Take 1 tablet (20 mg total) by mouth daily.  . Fluticasone-Salmeterol (ADVAIR) 250-50 MCG/DOSE AEPB Inhale 1 puff into the lungs every 12 (twelve) hours.  . methimazole (TAPAZOLE) 5 MG tablet TAKE 1/2 TABLET BY MOUTH DAILY  . omeprazole (PRILOSEC) 20 MG capsule Take 1 capsule (20 mg total) by mouth 2 (two) times daily before a meal.  . simvastatin (ZOCOR) 40 MG tablet TAKE 1 TABLET (40 MG TOTAL) BY MOUTH DAILY.  . SUMAtriptan (IMITREX) 100 MG tablet Take 1 tablet (100 mg total) by mouth as needed. May repeat in 2  hours if headache persists or recurs.  Marland Kitchen tiotropium (SPIRIVA HANDIHALER) 18 MCG inhalation capsule Place 1 capsule (18 mcg total) into inhaler and inhale daily.  Marland Kitchen tolterodine (DETROL LA) 4 MG 24 hr capsule 90TAKE 1 CAPSULE BY MOUTH EVERY DAY  . zolpidem (AMBIEN) 10 MG tablet Take 1 tablet (10 mg total) by mouth at bedtime as needed for up to 30 days for sleep.       New complaints: Right shoulder pain at times- mainly when she is lifting things a certain way.  Social history: Lives with husband- no  Longer working     Review of Systems     Objective:   Physical Exam Vitals signs and nursing note reviewed.  Constitutional:      General: She is not in acute distress.    Appearance: Normal appearance. She is well-developed.  HENT:     Head: Normocephalic.     Nose: Nose normal.  Eyes:     Pupils: Pupils are equal, round, and reactive to light.  Neck:     Musculoskeletal: Normal range of motion and neck supple.     Vascular: No carotid bruit or JVD.  Cardiovascular:     Rate and Rhythm: Normal rate and regular rhythm.     Heart sounds: Normal heart sounds.  Pulmonary:     Effort: Pulmonary effort is normal. No respiratory distress.  Breath sounds: Normal breath sounds. No wheezing or rales.  Chest:     Chest wall: No tenderness.  Abdominal:     General: Bowel sounds are normal. There is no distension or abdominal bruit.     Palpations: Abdomen is soft. There is no hepatomegaly, splenomegaly, mass or pulsatile mass.     Tenderness: There is no abdominal tenderness.  Genitourinary:    General: Normal vulva.     Vagina: No vaginal discharge.     Rectum: Normal.     Comments: Cervix parous and pink No adnexal masses or tenderness Musculoskeletal: Normal range of motion.     Comments: FROM of right shoulder without pain noted Grips equal bil  Lymphadenopathy:     Cervical: No cervical adenopathy.  Skin:    General: Skin is warm and dry.  Neurological:      Mental Status: She is alert and oriented to person, place, and time.     Deep Tendon Reflexes: Reflexes are normal and symmetric.  Psychiatric:        Behavior: Behavior normal.        Thought Content: Thought content normal.        Judgment: Judgment normal.    BP 128/69   Pulse 72   Temp 97.7 F (36.5 C) (Oral)   Ht 5' 2"  (1.575 m)   Wt 191 lb (86.6 kg)   BMI 34.93 kg/m   EKG- NSR-Barbara Hassell Done, FNP       Assessment & Plan:  Barbara Boyd comes in today with chief complaint of Annual Exam   Diagnosis and orders addressed:  1. Annual physical exam - Urinalysis, Complete - CBC with Differential/Platelet - Pap IG (Image Guided)  2. Mixed hyperlipidemia Low fat diet - CMP14+EGFR - Lipid panel - EKG 12-Lead - simvastatin (ZOCOR) 40 MG tablet; TAKE 1 TABLET (40 MG TOTAL) BY MOUTH DAILY.  Dispense: 30 tablet; Refill: 5  3. COPD with chronic bronchitis (HCC) Continue inhalers - Fluticasone-Salmeterol (ADVAIR) 250-50 MCG/DOSE AEPB; Inhale 1 puff into the lungs every 12 (twelve) hours.  Dispense: 60 each; Refill: 5 - tiotropium (SPIRIVA HANDIHALER) 18 MCG inhalation capsule; Place 1 capsule (18 mcg total) into inhaler and inhale daily.  Dispense: 30 capsule; Refill: 5  4. Smoker Smoking cessation encouraged  5. Graves disease - Thyroid Panel With TSH - methimazole (TAPAZOLE) 5 MG tablet; TAKE 1/2 TABLET BY MOUTH DAILY  Dispense: 30 tablet; Refill: 5  6. Recurrent major depressive disorder, in full remission ([place]) Stress management - escitalopram (LEXAPRO) 20 MG tablet; Take 1 tablet (20 mg total) by mouth daily.  Dispense: 90 tablet; Refill: 1  7. Primary insomnia Bedtime routine  8. Vitamin D deficiency Encouraged daily vitamin d supplement  9. Urinary urgency - tolterodine (DETROL LA) 4 MG 24 hr capsule; 90TAKE 1 CAPSULE BY MOUTH EVERY DAY  Dispense: 90 capsule; Refill: 1  10. Infiltrating ductal carcinoma of breast, stage 1, left (HCC) Keep follow  ups with oncology  11. Osteopenia, unspecified location Weight earing exercises Will do dexascan once covid is over  12. BMI 30.0-30.9,adult Discussed diet and exercise for person with BMI >25 Will recheck weight in 3-6 months  13. Solitary pulmonary nodule - CT CHEST LCS NODULE F/U W/O CONTRAST; Future  14. Gastroesophageal reflux disease, esophagitis presence not specified Avoid spicy foods Do not eat 2 hours prior to bedtime - omeprazole (PRILOSEC) 20 MG capsule; Take 1 capsule (20 mg total) by mouth 2 (two) times daily before a meal.  Dispense: 180 capsule; Refill: 1   Labs pending Health Maintenance reviewed Diet and exercise encouraged  Follow up plan: 6 months   Barbara Hassell Done, FNP

## 2018-10-23 LAB — CBC WITH DIFFERENTIAL/PLATELET
Basophils Absolute: 0.1 10*3/uL (ref 0.0–0.2)
Basos: 1 %
EOS (ABSOLUTE): 0.2 10*3/uL (ref 0.0–0.4)
Eos: 3 %
Hematocrit: 41.5 % (ref 34.0–46.6)
Hemoglobin: 14.1 g/dL (ref 11.1–15.9)
Immature Grans (Abs): 0 10*3/uL (ref 0.0–0.1)
Immature Granulocytes: 0 %
Lymphocytes Absolute: 1.9 10*3/uL (ref 0.7–3.1)
Lymphs: 33 %
MCH: 33.5 pg — ABNORMAL HIGH (ref 26.6–33.0)
MCHC: 34 g/dL (ref 31.5–35.7)
MCV: 99 fL — ABNORMAL HIGH (ref 79–97)
Monocytes Absolute: 0.6 10*3/uL (ref 0.1–0.9)
Monocytes: 11 %
Neutrophils Absolute: 3 10*3/uL (ref 1.4–7.0)
Neutrophils: 52 %
Platelets: 293 10*3/uL (ref 150–450)
RBC: 4.21 x10E6/uL (ref 3.77–5.28)
RDW: 12.6 % (ref 11.7–15.4)
WBC: 5.7 10*3/uL (ref 3.4–10.8)

## 2018-10-23 LAB — CMP14+EGFR
ALT: 12 IU/L (ref 0–32)
AST: 18 IU/L (ref 0–40)
Albumin/Globulin Ratio: 1.7 (ref 1.2–2.2)
Albumin: 4.1 g/dL (ref 3.8–4.8)
Alkaline Phosphatase: 89 IU/L (ref 39–117)
BUN/Creatinine Ratio: 13 (ref 12–28)
BUN: 9 mg/dL (ref 8–27)
Bilirubin Total: 0.3 mg/dL (ref 0.0–1.2)
CO2: 24 mmol/L (ref 20–29)
Calcium: 8.9 mg/dL (ref 8.7–10.3)
Chloride: 98 mmol/L (ref 96–106)
Creatinine, Ser: 0.7 mg/dL (ref 0.57–1.00)
GFR calc Af Amer: 103 mL/min/{1.73_m2} (ref >59)
GFR calc non Af Amer: 89 mL/min/{1.73_m2} (ref >59)
Globulin, Total: 2.4 g/dL (ref 1.5–4.5)
Glucose: 93 mg/dL (ref 65–99)
Potassium: 4.2 mmol/L (ref 3.5–5.2)
Sodium: 136 mmol/L (ref 134–144)
Total Protein: 6.5 g/dL (ref 6.0–8.5)

## 2018-10-23 LAB — THYROID PANEL WITH TSH
Free Thyroxine Index: 1.7 (ref 1.2–4.9)
T3 Uptake Ratio: 24 % (ref 24–39)
T4, Total: 7 ug/dL (ref 4.5–12.0)
TSH: 2.4 u[IU]/mL (ref 0.450–4.500)

## 2018-10-23 LAB — LIPID PANEL
Chol/HDL Ratio: 3.8 ratio (ref 0.0–4.4)
Cholesterol, Total: 200 mg/dL — ABNORMAL HIGH (ref 100–199)
HDL: 52 mg/dL (ref >39)
LDL Calculated: 136 mg/dL — ABNORMAL HIGH (ref 0–99)
Triglycerides: 60 mg/dL (ref 0–149)
VLDL Cholesterol Cal: 12 mg/dL (ref 5–40)

## 2018-10-26 NOTE — Addendum Note (Signed)
Addended by: Chevis Pretty on: 10/26/2018 03:53 PM   Modules accepted: Level of Service

## 2018-10-27 LAB — PAP IG (IMAGE GUIDED)

## 2018-11-02 ENCOUNTER — Telehealth: Payer: Self-pay | Admitting: Nurse Practitioner

## 2018-11-02 MED ORDER — MELOXICAM 15 MG PO TABS: 15.0000 mg | ORAL_TABLET | Freq: Every day | ORAL | 0 refills | Status: DC

## 2018-11-02 NOTE — Telephone Encounter (Signed)
Pt aware.

## 2018-11-02 NOTE — Telephone Encounter (Signed)
I sent one months worth of meloxicam since I am covering for [facility] Barbara Boyd

## 2018-11-02 NOTE — Telephone Encounter (Signed)
This was not noted in office note and not sent - can you take a look, if you can not send this - it will have to be held for MMM next week.

## 2018-11-24 ENCOUNTER — Ambulatory Visit (HOSPITAL_COMMUNITY): Payer: Medicare Other

## 2018-11-27 ENCOUNTER — Other Ambulatory Visit: Payer: Self-pay | Admitting: Family Medicine

## 2018-12-02 ENCOUNTER — Ambulatory Visit (HOSPITAL_COMMUNITY)
Admission: RE | Admit: 2018-12-02 | Discharge: 2018-12-02 | Disposition: A | Discharge status: Home / Self Care | Payer: Medicare Other | Source: Ambulatory Visit | Attending: Nurse Practitioner | Admitting: Nurse Practitioner

## 2018-12-02 ENCOUNTER — Other Ambulatory Visit: Payer: Self-pay

## 2018-12-02 DIAGNOSIS — S6992XA Unspecified injury of left wrist, hand and finger(s), initial encounter: Secondary | ICD-10-CM | POA: Diagnosis not present

## 2018-12-02 DIAGNOSIS — I700 Atherosclerosis of aorta: Secondary | ICD-10-CM | POA: Insufficient documentation

## 2018-12-02 DIAGNOSIS — S52601A Unspecified fracture of lower end of right ulna, initial encounter for closed fracture: Secondary | ICD-10-CM | POA: Diagnosis not present

## 2018-12-02 DIAGNOSIS — R918 Other nonspecific abnormal finding of lung field: Secondary | ICD-10-CM | POA: Diagnosis not present

## 2018-12-02 DIAGNOSIS — R911 Solitary pulmonary nodule: Secondary | ICD-10-CM | POA: Insufficient documentation

## 2018-12-02 DIAGNOSIS — J432 Centrilobular emphysema: Secondary | ICD-10-CM | POA: Insufficient documentation

## 2018-12-04 DIAGNOSIS — M25532 Pain in left wrist: Secondary | ICD-10-CM | POA: Diagnosis not present

## 2018-12-04 DIAGNOSIS — S52622A Torus fracture of lower end of left ulna, initial encounter for closed fracture: Secondary | ICD-10-CM | POA: Diagnosis not present

## 2018-12-04 DIAGNOSIS — S52609A Unspecified fracture of lower end of unspecified ulna, initial encounter for closed fracture: Secondary | ICD-10-CM | POA: Insufficient documentation

## 2018-12-11 DIAGNOSIS — S52622A Torus fracture of lower end of left ulna, initial encounter for closed fracture: Secondary | ICD-10-CM | POA: Diagnosis not present

## 2018-12-22 ENCOUNTER — Other Ambulatory Visit: Payer: Self-pay | Admitting: Nurse Practitioner

## 2018-12-22 DIAGNOSIS — G43001 Migraine without aura, not intractable, with status migrainosus: Secondary | ICD-10-CM

## 2019-01-06 DIAGNOSIS — S52622A Torus fracture of lower end of left ulna, initial encounter for closed fracture: Secondary | ICD-10-CM | POA: Diagnosis not present

## 2019-02-02 DIAGNOSIS — Z961 Presence of intraocular lens: Secondary | ICD-10-CM | POA: Diagnosis not present

## 2019-02-05 ENCOUNTER — Other Ambulatory Visit: Payer: Self-pay | Admitting: Nurse Practitioner

## 2019-02-17 DIAGNOSIS — S52622A Torus fracture of lower end of left ulna, initial encounter for closed fracture: Secondary | ICD-10-CM | POA: Diagnosis not present

## 2019-03-03 DIAGNOSIS — Z9013 Acquired absence of bilateral breasts and nipples: Secondary | ICD-10-CM | POA: Diagnosis not present

## 2019-03-03 DIAGNOSIS — Z170 Estrogen receptor positive status [ER+]: Secondary | ICD-10-CM | POA: Diagnosis not present

## 2019-03-03 DIAGNOSIS — Z79899 Other long term (current) drug therapy: Secondary | ICD-10-CM | POA: Diagnosis not present

## 2019-03-03 DIAGNOSIS — C50412 Malignant neoplasm of upper-outer quadrant of left female breast: Secondary | ICD-10-CM | POA: Diagnosis not present

## 2019-03-03 DIAGNOSIS — S62102A Fracture of unspecified carpal bone, left wrist, initial encounter for closed fracture: Secondary | ICD-10-CM | POA: Insufficient documentation

## 2019-03-03 DIAGNOSIS — S62102S Fracture of unspecified carpal bone, left wrist, sequela: Secondary | ICD-10-CM | POA: Diagnosis not present

## 2019-03-03 DIAGNOSIS — F17218 Nicotine dependence, cigarettes, with other nicotine-induced disorders: Secondary | ICD-10-CM | POA: Diagnosis not present

## 2019-03-03 DIAGNOSIS — F17210 Nicotine dependence, cigarettes, uncomplicated: Secondary | ICD-10-CM | POA: Diagnosis not present

## 2019-03-09 ENCOUNTER — Other Ambulatory Visit: Payer: Self-pay

## 2019-03-10 ENCOUNTER — Ambulatory Visit (INDEPENDENT_AMBULATORY_CARE_PROVIDER_SITE_OTHER): Payer: Medicare Other

## 2019-03-10 DIAGNOSIS — Z23 Encounter for immunization: Secondary | ICD-10-CM

## 2019-04-04 ENCOUNTER — Other Ambulatory Visit: Payer: Self-pay | Admitting: Nurse Practitioner

## 2019-04-06 DIAGNOSIS — L723 Sebaceous cyst: Secondary | ICD-10-CM | POA: Diagnosis not present

## 2019-04-06 DIAGNOSIS — L853 Xerosis cutis: Secondary | ICD-10-CM | POA: Diagnosis not present

## 2019-04-06 DIAGNOSIS — L299 Pruritus, unspecified: Secondary | ICD-10-CM | POA: Diagnosis not present

## 2019-04-06 DIAGNOSIS — L300 Nummular dermatitis: Secondary | ICD-10-CM | POA: Diagnosis not present

## 2019-04-21 ENCOUNTER — Encounter: Payer: Self-pay | Admitting: Gastroenterology

## 2019-04-26 ENCOUNTER — Ambulatory Visit: Payer: Self-pay | Admitting: Nurse Practitioner

## 2019-04-26 ENCOUNTER — Other Ambulatory Visit: Payer: Self-pay

## 2019-04-27 ENCOUNTER — Encounter: Payer: Self-pay | Admitting: Nurse Practitioner

## 2019-04-27 ENCOUNTER — Ambulatory Visit (INDEPENDENT_AMBULATORY_CARE_PROVIDER_SITE_OTHER): Payer: Medicare Other | Admitting: Nurse Practitioner

## 2019-04-27 VITALS — BP 145/82 | HR 70 | Temp 97.5°F | Resp 20 | Ht 62.0 in | Wt 192.0 lb

## 2019-04-27 DIAGNOSIS — M8580 Other specified disorders of bone density and structure, unspecified site: Secondary | ICD-10-CM | POA: Diagnosis not present

## 2019-04-27 DIAGNOSIS — Z6830 Body mass index (BMI) 30.0-30.9, adult: Secondary | ICD-10-CM

## 2019-04-27 DIAGNOSIS — I10 Essential (primary) hypertension: Secondary | ICD-10-CM | POA: Diagnosis not present

## 2019-04-27 DIAGNOSIS — R3915 Urgency of urination: Secondary | ICD-10-CM | POA: Diagnosis not present

## 2019-04-27 DIAGNOSIS — F3342 Major depressive disorder, recurrent, in full remission: Secondary | ICD-10-CM | POA: Diagnosis not present

## 2019-04-27 DIAGNOSIS — F17200 Nicotine dependence, unspecified, uncomplicated: Secondary | ICD-10-CM

## 2019-04-27 DIAGNOSIS — E782 Mixed hyperlipidemia: Secondary | ICD-10-CM | POA: Diagnosis not present

## 2019-04-27 DIAGNOSIS — K219 Gastro-esophageal reflux disease without esophagitis: Secondary | ICD-10-CM

## 2019-04-27 DIAGNOSIS — J449 Chronic obstructive pulmonary disease, unspecified: Secondary | ICD-10-CM | POA: Diagnosis not present

## 2019-04-27 DIAGNOSIS — E559 Vitamin D deficiency, unspecified: Secondary | ICD-10-CM

## 2019-04-27 DIAGNOSIS — F5101 Primary insomnia: Secondary | ICD-10-CM | POA: Diagnosis not present

## 2019-04-27 DIAGNOSIS — Z23 Encounter for immunization: Secondary | ICD-10-CM

## 2019-04-27 MED ORDER — OMEPRAZOLE 20 MG PO CPDR: 20.0000 mg | DELAYED_RELEASE_CAPSULE | Freq: Two times a day (BID) | ORAL | 1 refills | Status: DC

## 2019-04-27 MED ORDER — HYDROCHLOROTHIAZIDE 25 MG PO TABS: 25.0000 mg | ORAL_TABLET | Freq: Every day | ORAL | 3 refills | Status: DC

## 2019-04-27 MED ORDER — ZOLPIDEM TARTRATE 10 MG PO TABS: 10.0000 mg | ORAL_TABLET | Freq: Every evening | ORAL | 1 refills | Status: DC | PRN

## 2019-04-27 MED ORDER — MELOXICAM 15 MG PO TABS: 15.0000 mg | ORAL_TABLET | Freq: Every day | ORAL | 1 refills | Status: DC

## 2019-04-27 MED ORDER — FLUTICASONE-SALMETEROL 250-50 MCG/DOSE IN AEPB: 1.0000 | INHALATION_SPRAY | Freq: Two times a day (BID) | RESPIRATORY_TRACT | 5 refills | Status: DC

## 2019-04-27 MED ORDER — SIMVASTATIN 40 MG PO TABS: ORAL_TABLET | ORAL | 1 refills | Status: DC

## 2019-04-27 MED ORDER — SPIRIVA HANDIHALER 18 MCG IN CAPS: 18.0000 ug | ORAL_CAPSULE | Freq: Every day | RESPIRATORY_TRACT | 5 refills | Status: DC

## 2019-04-27 NOTE — Addendum Note (Signed)
Addended by: Rolena Infante on: 04/27/2019 12:04 PM   Modules accepted: Orders

## 2019-04-27 NOTE — Patient Instructions (Signed)

## 2019-04-27 NOTE — Progress Notes (Addendum)
Subjective:    Patient ID: Barbara Boyd, female    DOB: [birth year], 68 y.o.   MRN: [id number]   Chief Complaint: Medical Management of Chronic Issues    HPI:  1. Mixed hyperlipidemia Does not watch diet and does little to  No exercise.she is currently on zocor but does not take daily. Lab Results  Component Value Date   CHOL 200 (H) 10/22/2018   HDL 52 10/22/2018   LDLCALC 136 (H) 10/22/2018   TRIG 60 10/22/2018   CHOLHDL 3.8 10/22/2018     2. COPD with chronic bronchitis (HCC) Has chronic cough. Use advair and spirivia some days. She has not needed albuterol in several months.  3. Smoker Is still smoking over a pack a day. Says she just cannot quit despite trying several times.  4. Osteopenia, unspecified location Last dexascan was done on 04/27/14 with t score of -1.5. She does very little weight bearing exercises. We will schedule her for repat dexascan.  5. Vitamin D deficiency Takes vitamin d supplement when she rememebrs  6. Recurrent major depressive disorder, in full remission [facility]) She i son lexapro daily for depression. Says she is doing well right now. . Depression screen [facility] 2/9 04/27/2019 10/22/2018 06/16/2018  Decreased Interest 0 0 0  Down, Depressed, Hopeless 0 0 0  PHQ - 2 Score 0 0 0  Altered sleeping - - -  Tired, decreased energy - - -  Change in appetite - - -  Feeling bad or failure about yourself  - - -  Trouble concentrating - - -  Moving slowly or fidgety/restless - - -  Suicidal thoughts - - -  PHQ-9 Score - - -  Difficult doing work/chores - - -  Some recent data might be hidden     7. Primary insomnia Feels rested in mornings when she takes [place], but she does not take every night.  8. Urinary urgency Is currently on detrol LA which helps hr urgency feeling.  9. BMI 30.0-30.9,adult No recent weight changes.  Wt Readings from Last 3 Encounters:  04/27/19 192 lb (87.1 kg)  10/22/18 191 lb (86.6 kg)  06/16/18 194 lb (88 kg)   BMI Readings from Last 3 Encounters:  04/27/19 35.12 kg/m  10/22/18 34.93 kg/m  06/16/18 35.48 kg/m    BP Readings from Last 3 Encounters:  04/27/19 (!) 145/82  10/22/18 128/69  06/16/18 134/73      Outpatient Encounter Medications as of 04/27/2019  Medication Sig  . CALCIUM PO Take 500 mg by mouth daily.  Marland Kitchen escitalopram (LEXAPRO) 20 MG tablet Take 1 tablet (20 mg total) by mouth daily.  . Fluticasone-Salmeterol (ADVAIR) 250-50 MCG/DOSE AEPB Inhale 1 puff into the lungs every 12 (twelve) hours.  . meloxicam (MOBIC) 15 MG tablet TAKE 1 TABLET BY MOUTH EVERY DAY  . methimazole (TAPAZOLE) 5 MG tablet TAKE 1/2 TABLET BY MOUTH DAILY  . omeprazole (PRILOSEC) 20 MG capsule Take 1 capsule (20 mg total) by mouth 2 (two) times daily before a meal.  . simvastatin (ZOCOR) 40 MG tablet TAKE 1 TABLET (40 MG TOTAL) BY MOUTH DAILY.  . SUMAtriptan (IMITREX) 100 MG tablet TAKE 1 TABLET (100 MG TOTAL) BY MOUTH AS NEEDED. MAY REPEAT IN 2 HOURS IF HEADACHE PERSISTS OR RECURS.  Marland Kitchen tiotropium (SPIRIVA HANDIHALER) 18 MCG inhalation capsule Place 1 capsule (18 mcg total) into inhaler and inhale daily.  Marland Kitchen tolterodine (DETROL LA) 4 MG 24 hr capsule 90TAKE 1 CAPSULE BY MOUTH EVERY DAY  .  zolpidem (AMBIEN) 10 MG tablet Take 1 tablet (10 mg total) by mouth at bedtime as needed for up to 30 days for sleep.     Past Surgical History:  Procedure Laterality Date  . 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 BREAST RECONSTRUCTION    . COLONOSCOPY      Family History  Problem Relation Age of Onset  . Lung cancer Father   . Arthritis Mother   . Osteoporosis Mother   . Dementia Mother   . Breast cancer Maternal Aunt   . Breast cancer Paternal Aunt   . Breast cancer Maternal Aunt   . Osteoporosis Maternal Grandmother   . Colon cancer Neg Hx   . Rectal cancer Neg Hx   . Stomach cancer Neg Hx     New complaints: None today  Social history: lives with her husband and  is retired  Controlled substance contract: only takes Microbiologist Prn    Review of Systems  Constitutional: Negative for activity change and appetite change.  HENT: Negative.   Eyes: Negative for pain.  Respiratory: Negative for shortness of breath.   Cardiovascular: Negative for chest pain, palpitations and leg swelling.  Gastrointestinal: Negative for abdominal pain.  Endocrine: Negative for polydipsia.  Genitourinary: Negative.   Skin: Negative for rash.  Neurological: Negative for dizziness, weakness and headaches.  Hematological: Does not bruise/bleed easily.  Psychiatric/Behavioral: Negative.   All other systems reviewed and are negative.      Objective:   Physical Exam Vitals signs and nursing note reviewed.  Constitutional:      General: She is not in acute distress.    Appearance: Normal appearance. She is well-developed.  HENT:     Head: Normocephalic.     Nose: Nose normal.  Eyes:     Pupils: Pupils are equal, round, and reactive to light.  Neck:     Musculoskeletal: Normal range of motion and neck supple.     Vascular: No carotid bruit or JVD.  Cardiovascular:     Rate and Rhythm: Normal rate and regular rhythm.     Heart sounds: Normal heart sounds.  Pulmonary:     Effort: Pulmonary effort is normal. No respiratory distress.     Breath sounds: Wheezing (exp wheezes throughout) present. No rales.  Chest:     Chest wall: No tenderness.  Abdominal:     General: Bowel sounds are normal. There is no distension or abdominal bruit.     Palpations: Abdomen is soft. There is no hepatomegaly, splenomegaly, mass or pulsatile mass.     Tenderness: There is no abdominal tenderness.  Musculoskeletal: Normal range of motion.  Lymphadenopathy:     Cervical: No cervical adenopathy.  Skin:    General: Skin is warm and dry.  Neurological:     Mental Status: She is alert and oriented to person, place, and time.     Deep Tendon Reflexes: Reflexes are normal and symmetric.   Psychiatric:        Behavior: Behavior normal.        Thought Content: Thought content normal.        Judgment: Judgment normal.    BP (!) 145/82   Pulse 70   Temp (!) 97.5 F (36.4 C) (Temporal)   Resp 20   Ht 5\' 2"  (1.575 m)   Wt 192 lb (87.1 kg)   SpO2 100%   BMI 35.12 kg/m         Assessment & Plan:  Louiza A Dambach comes in today with chief complaint of Medical Management of Chronic Issues   Diagnosis and orders addressed:  1. Mixed hyperlipidemia Low sodium diet - simvastatin (ZOCOR) 40 MG tablet; TAKE 1 TABLET (40 MG TOTAL) BY MOUTH DAILY.  Dispense: 90 tablet; Refill: 1  2. COPD with chronic bronchitis ([place]) Need to use inhalers daily - tiotropium (SPIRIVA HANDIHALER) 18 MCG inhalation capsule; Place 1 capsule (18 mcg total) into inhaler and inhale daily.  Dispense: 30 capsule; Refill: 5 - Fluticasone-Salmeterol (ADVAIR) 250-50 MCG/DOSE AEPB; Inhale 1 puff into the lungs every 12 (twelve) hours.  Dispense: 60 each; Refill: 5  3. Smoker Smoking cessation or at least cutting back encourgaed  4. Osteopenia, unspecified location Eight bearing exercise encouraged Will schedule for bone density testdaily vitamin d deficiency  5. Vitamin D deficiency Daily vitamin d supplement  6. Recurrent major depressive disorder, in full remission ([place]) stress management  7. Primary insomnia Bedtime routine - zolpidem (AMBIEN) 10 MG tablet; Take 1 tablet (10 mg total) by mouth at bedtime as needed for sleep.  Dispense: 15 tablet; Refill: 1  8. Urinary urgency  9. BMI 30.0-30.9,adult Discussed diet and exercise for person with BMI >25 Will recheck weight in 3-6 months  10. Essential hypertension New dx for her Keep diary of blood pressures - hydrochlorothiazide (HYDRODIURIL) 25 MG tablet; Take 1 tablet (25 mg total) by mouth daily.  Dispense: 90 tablet; Refill: 3  11. Gastroesophageal reflux disease Avoid spicy foods Do not eat 2 hours prior to bedtime -  omeprazole (PRILOSEC) 20 MG capsule; Take 1 capsule (20 mg total) by mouth 2 (two) times daily before a meal.  Dispense: 180 capsule; Refill: 1   Labs pending Health Maintenance reviewed Diet and exercise encouraged  Follow up plan: 3 months   [name], FNP

## 2019-04-28 LAB — CMP14+EGFR
ALT: 9 IU/L (ref 0–32)
AST: 16 IU/L (ref 0–40)
Albumin/Globulin Ratio: 1.7 (ref 1.2–2.2)
Albumin: 4 g/dL (ref 3.8–4.8)
Alkaline Phosphatase: 83 IU/L (ref 39–117)
BUN/Creatinine Ratio: 20 (ref 12–28)
BUN: 14 mg/dL (ref 8–27)
Bilirubin Total: 0.4 mg/dL (ref 0.0–1.2)
CO2: 26 mmol/L (ref 20–29)
Calcium: 8.6 mg/dL — ABNORMAL LOW (ref 8.7–10.3)
Chloride: 99 mmol/L (ref 96–106)
Creatinine, Ser: 0.71 mg/dL (ref 0.57–1.00)
GFR calc Af Amer: 101 mL/min/{1.73_m2} (ref >59)
GFR calc non Af Amer: 88 mL/min/{1.73_m2} (ref >59)
Globulin, Total: 2.3 g/dL (ref 1.5–4.5)
Glucose: 89 mg/dL (ref 65–99)
Potassium: 4.6 mmol/L (ref 3.5–5.2)
Sodium: 137 mmol/L (ref 134–144)
Total Protein: 6.3 g/dL (ref 6.0–8.5)

## 2019-04-28 LAB — LIPID PANEL
Chol/HDL Ratio: 3 ratio (ref 0.0–4.4)
Cholesterol, Total: 179 mg/dL (ref 100–199)
HDL: 59 mg/dL (ref >39)
LDL Chol Calc (NIH): 107 mg/dL — ABNORMAL HIGH (ref 0–99)
Triglycerides: 68 mg/dL (ref 0–149)
VLDL Cholesterol Cal: 13 mg/dL (ref 5–40)

## 2019-06-08 ENCOUNTER — Other Ambulatory Visit: Payer: Self-pay | Admitting: Nurse Practitioner

## 2019-06-08 DIAGNOSIS — M79676 Pain in unspecified toe(s): Secondary | ICD-10-CM | POA: Diagnosis not present

## 2019-06-08 DIAGNOSIS — L03031 Cellulitis of right toe: Secondary | ICD-10-CM | POA: Diagnosis not present

## 2019-06-08 DIAGNOSIS — R3915 Urgency of urination: Secondary | ICD-10-CM

## 2019-06-17 ENCOUNTER — Other Ambulatory Visit: Payer: Self-pay | Admitting: Nurse Practitioner

## 2019-06-17 DIAGNOSIS — F3342 Major depressive disorder, recurrent, in full remission: Secondary | ICD-10-CM

## 2019-06-22 DIAGNOSIS — L03031 Cellulitis of right toe: Secondary | ICD-10-CM | POA: Diagnosis not present

## 2019-06-22 DIAGNOSIS — M79676 Pain in unspecified toe(s): Secondary | ICD-10-CM | POA: Diagnosis not present

## 2019-07-13 DIAGNOSIS — L03032 Cellulitis of left toe: Secondary | ICD-10-CM | POA: Diagnosis not present

## 2019-07-13 DIAGNOSIS — M79676 Pain in unspecified toe(s): Secondary | ICD-10-CM | POA: Diagnosis not present

## 2019-07-20 DIAGNOSIS — L299 Pruritus, unspecified: Secondary | ICD-10-CM | POA: Diagnosis not present

## 2019-07-20 DIAGNOSIS — D0461 Carcinoma in situ of skin of right upper limb, including shoulder: Secondary | ICD-10-CM | POA: Diagnosis not present

## 2019-07-20 DIAGNOSIS — D485 Neoplasm of uncertain behavior of skin: Secondary | ICD-10-CM | POA: Diagnosis not present

## 2019-07-20 DIAGNOSIS — L853 Xerosis cutis: Secondary | ICD-10-CM | POA: Diagnosis not present

## 2019-07-20 DIAGNOSIS — L309 Dermatitis, unspecified: Secondary | ICD-10-CM | POA: Diagnosis not present

## 2019-07-22 DIAGNOSIS — Z23 Encounter for immunization: Secondary | ICD-10-CM | POA: Diagnosis not present

## 2019-07-29 ENCOUNTER — Ambulatory Visit: Payer: Self-pay | Admitting: Nurse Practitioner

## 2019-08-05 DIAGNOSIS — D485 Neoplasm of uncertain behavior of skin: Secondary | ICD-10-CM | POA: Diagnosis not present

## 2019-08-05 DIAGNOSIS — L308 Other specified dermatitis: Secondary | ICD-10-CM | POA: Diagnosis not present

## 2019-08-05 DIAGNOSIS — L089 Local infection of the skin and subcutaneous tissue, unspecified: Secondary | ICD-10-CM | POA: Diagnosis not present

## 2019-08-16 DIAGNOSIS — L905 Scar conditions and fibrosis of skin: Secondary | ICD-10-CM | POA: Diagnosis not present

## 2019-08-16 DIAGNOSIS — D0461 Carcinoma in situ of skin of right upper limb, including shoulder: Secondary | ICD-10-CM | POA: Diagnosis not present

## 2019-08-17 ENCOUNTER — Telehealth: Payer: Self-pay | Admitting: Nurse Practitioner

## 2019-08-17 NOTE — Chronic Care Management (AMB) (Signed)
  Chronic Care Management   Note  08/17/2019 Name: CYANNE DELMAR MRN: [id number] DOB: September 04, 1950  [name] is a 69 y.o. year old female who is a primary care patient of Chevis Pretty, [place]. I reached out to [name] by phone today in response to a referral sent by Ms. Mylasia A Demartin's health plan.     Ms. Croson was given information about Chronic Care Management services today including:  1. CCM service includes personalized support from designated clinical staff supervised by her physician, including individualized plan of care and coordination with other care providers 2. 24/7 contact phone numbers for assistance for urgent and routine care needs. 3. Service will only be billed when office clinical staff spend 20 minutes or more in a month to coordinate care. 4. Only one practitioner may furnish and bill the service in a calendar month. 5. The patient may stop CCM services at any time (effective at the end of the month) by phone call to the office staff. 6. The patient will be responsible for cost sharing (co-pay) of up to 20% of the service fee (after annual deductible is met).  Patient agreed to services and verbal consent obtained.   Follow up plan: Telephone appointment with care management team member scheduled for:03/27/2020  Noreene Larsson, [place], [place], [place] [address] Direct Dial: [phone] [email or web address] Website: [email or web address]

## 2019-08-27 DIAGNOSIS — Z23 Encounter for immunization: Secondary | ICD-10-CM | POA: Diagnosis not present

## 2019-09-09 ENCOUNTER — Other Ambulatory Visit: Payer: Self-pay | Admitting: Nurse Practitioner

## 2019-09-09 DIAGNOSIS — F3342 Major depressive disorder, recurrent, in full remission: Secondary | ICD-10-CM

## 2019-10-01 ENCOUNTER — Other Ambulatory Visit: Payer: Self-pay | Admitting: Nurse Practitioner

## 2019-10-01 DIAGNOSIS — F3342 Major depressive disorder, recurrent, in full remission: Secondary | ICD-10-CM

## 2019-10-05 ENCOUNTER — Other Ambulatory Visit: Payer: Self-pay | Admitting: Nurse Practitioner

## 2019-10-12 ENCOUNTER — Encounter: Payer: Self-pay | Admitting: Nurse Practitioner

## 2019-10-12 ENCOUNTER — Ambulatory Visit (INDEPENDENT_AMBULATORY_CARE_PROVIDER_SITE_OTHER): Payer: Medicare Other | Admitting: Nurse Practitioner

## 2019-10-12 ENCOUNTER — Other Ambulatory Visit: Payer: Self-pay

## 2019-10-12 VITALS — BP 114/67 | HR 70 | Temp 98.2°F | Resp 20 | Ht 62.0 in | Wt 193.0 lb

## 2019-10-12 DIAGNOSIS — J449 Chronic obstructive pulmonary disease, unspecified: Secondary | ICD-10-CM | POA: Diagnosis not present

## 2019-10-12 DIAGNOSIS — Z6830 Body mass index (BMI) 30.0-30.9, adult: Secondary | ICD-10-CM

## 2019-10-12 DIAGNOSIS — G43001 Migraine without aura, not intractable, with status migrainosus: Secondary | ICD-10-CM | POA: Diagnosis not present

## 2019-10-12 DIAGNOSIS — E559 Vitamin D deficiency, unspecified: Secondary | ICD-10-CM

## 2019-10-12 DIAGNOSIS — F17200 Nicotine dependence, unspecified, uncomplicated: Secondary | ICD-10-CM

## 2019-10-12 DIAGNOSIS — E782 Mixed hyperlipidemia: Secondary | ICD-10-CM | POA: Diagnosis not present

## 2019-10-12 DIAGNOSIS — R3915 Urgency of urination: Secondary | ICD-10-CM

## 2019-10-12 DIAGNOSIS — F3342 Major depressive disorder, recurrent, in full remission: Secondary | ICD-10-CM | POA: Diagnosis not present

## 2019-10-12 DIAGNOSIS — I10 Essential (primary) hypertension: Secondary | ICD-10-CM | POA: Diagnosis not present

## 2019-10-12 DIAGNOSIS — K219 Gastro-esophageal reflux disease without esophagitis: Secondary | ICD-10-CM

## 2019-10-12 DIAGNOSIS — M8580 Other specified disorders of bone density and structure, unspecified site: Secondary | ICD-10-CM

## 2019-10-12 DIAGNOSIS — J4489 Other specified chronic obstructive pulmonary disease: Secondary | ICD-10-CM

## 2019-10-12 DIAGNOSIS — E0500 Thyrotoxicosis with diffuse goiter without thyrotoxic crisis or storm: Secondary | ICD-10-CM | POA: Diagnosis not present

## 2019-10-12 DIAGNOSIS — F5101 Primary insomnia: Secondary | ICD-10-CM | POA: Diagnosis not present

## 2019-10-12 MED ORDER — SIMVASTATIN 40 MG PO TABS: ORAL_TABLET | ORAL | 1 refills | Status: DC

## 2019-10-12 MED ORDER — MELOXICAM 15 MG PO TABS: 15.0000 mg | ORAL_TABLET | Freq: Every day | ORAL | 1 refills | Status: DC

## 2019-10-12 MED ORDER — SPIRIVA HANDIHALER 18 MCG IN CAPS: 18.0000 ug | ORAL_CAPSULE | Freq: Every day | RESPIRATORY_TRACT | 5 refills | Status: DC

## 2019-10-12 MED ORDER — SUMATRIPTAN SUCCINATE 100 MG PO TABS: 100.0000 mg | ORAL_TABLET | ORAL | 5 refills | Status: DC | PRN

## 2019-10-12 MED ORDER — TOLTERODINE TARTRATE ER 4 MG PO CP24: ORAL_CAPSULE | ORAL | 1 refills | Status: DC

## 2019-10-12 MED ORDER — HYDROCHLOROTHIAZIDE 25 MG PO TABS: 25.0000 mg | ORAL_TABLET | Freq: Every day | ORAL | 1 refills | Status: DC

## 2019-10-12 MED ORDER — ESCITALOPRAM OXALATE 20 MG PO TABS: 20.0000 mg | ORAL_TABLET | Freq: Every day | ORAL | 1 refills | Status: DC

## 2019-10-12 MED ORDER — METHIMAZOLE 5 MG PO TABS: ORAL_TABLET | ORAL | 1 refills | Status: DC

## 2019-10-12 MED ORDER — FLUTICASONE-SALMETEROL 250-50 MCG/DOSE IN AEPB: 1.0000 | INHALATION_SPRAY | Freq: Two times a day (BID) | RESPIRATORY_TRACT | 5 refills | Status: DC

## 2019-10-12 MED ORDER — OMEPRAZOLE 20 MG PO CPDR: 20.0000 mg | DELAYED_RELEASE_CAPSULE | Freq: Two times a day (BID) | ORAL | 1 refills | Status: DC

## 2019-10-12 NOTE — Progress Notes (Addendum)
Subjective:    Patient ID: Barbara Boyd, female    DOB: 1950/12/04, 69 y.o.   MRN: [id number]    Chief Complaint: medical management of chronic issues   HPI:  1. Migraine without aura and with status migrainosus, not intractable Only has migraine occaionally. Around 1x a month. The imitrx works well to get relief.  2. COPD with chronic bronchitis ([place]) has a chronic cough with occasional SOB. She is on advair 2x a day and spirivia 1x a day.   3. Graves disease She is on methamizole daily and is doing well a far as he knows. Denies any episode of tachycardia. Lab Results  Component Value Date   TSH 2.400 10/22/2018     4. Osteopenia, unspecified location Lat dexacan was done in 2015. tscore was -1.7. She has refused to repeat testing in the past   5. Recurrent major depressive disorder, in full remission [facility]) Patient is on lexapro and is doing ok. Stayimg cooped up in the house tends to make it worse. Depression screen [facility] 2/9 10/12/2019 04/27/2019 10/22/2018  Decreased Interest 2 0 0  Down, Depressed, Hopeless 1 0 0  PHQ - 2 Score 3 0 0  Altered sleeping 1 - -  Tired, decreased energy 3 - -  Change in appetite 1 - -  Feeling bad or failure about yourself  0 - -  Trouble concentrating 0 - -  Moving slowly or fidgety/restless 0 - -  Suicidal thoughts 0 - -  PHQ-9 Score 8 - -  Difficult doing work/chores Not difficult at all - -  Some recent data might be hidden    6. Primary insomnia Does take  ambien to sleep at night very often. Says she sleeps about 6 hours a night.  7. Mixed hyperlipidemia Does not watch diet and does very little exercise. Lab Results  Component Value Date   CHOL 179 04/27/2019   HDL 59 04/27/2019   LDLCALC 107 (H) 04/27/2019   TRIG 68 04/27/2019   CHOLHDL 3.0 04/27/2019     8. Vitamin D deficiency Takes when she remembers to take. The last sevaerl weeks she has been taking gummy vitamins that have vitamin d in them.  9. Urinary  urgency She is on detrol LA which helps, but she still  has to go often  10. Smoker Smoke over a pack a day, and says she just cannot quit right now.  11. BMI 30.0-30.9,adult Weight is up 1 lb from previous visit Wt Readings from Last 3 Encounters:  10/12/19 193 lb (87.5 kg)  04/27/19 192 lb (87.1 kg)  10/22/18 191 lb (86.6 kg)   BMI Readings from Last 3 Encounters:  10/12/19 35.30 kg/m  04/27/19 35.12 kg/m  10/22/18 34.93 kg/m     Outpatient Encounter Medications as of 10/12/2019  Medication Sig  . CALCIUM PO Take 500 mg by mouth daily.  Marland Kitchen escitalopram (LEXAPRO) 20 MG tablet Take 1 tablet (20 mg total) by mouth daily. Needs appointment for future refills.  . Fluticasone-Salmeterol (ADVAIR) 250-50 MCG/DOSE AEPB Inhale 1 puff into the lungs every 12 (twelve) hours.  . hydrochlorothiazide (HYDRODIURIL) 25 MG tablet Take 1 tablet (25 mg total) by mouth daily.  . meloxicam (MOBIC) 15 MG tablet Take 1 tablet (15 mg total) by mouth daily. Needs to be seen before next refill.  . methimazole (TAPAZOLE) 5 MG tablet TAKE 1/2 TABLET BY MOUTH DAILY  . omeprazole (PRILOSEC) 20 MG capsule Take 1 capsule (20 mg total) by mouth  2 (two) times daily before a meal.  . simvastatin (ZOCOR) 40 MG tablet TAKE 1 TABLET (40 MG TOTAL) BY MOUTH DAILY.  . SUMAtriptan (IMITREX) 100 MG tablet TAKE 1 TABLET (100 MG TOTAL) BY MOUTH AS NEEDED. MAY REPEAT IN 2 HOURS IF HEADACHE PERSISTS OR RECURS.  Marland Kitchen tiotropium (SPIRIVA HANDIHALER) 18 MCG inhalation capsule Place 1 capsule (18 mcg total) into inhaler and inhale daily.  Marland Kitchen tolterodine (DETROL LA) 4 MG 24 hr capsule TAKE 1 CAPSULE BY MOUTH EVERY DAY  . zolpidem (AMBIEN) 10 MG tablet Take 1 tablet (10 mg total) by mouth at bedtime as needed for sleep.     Past Surgical History:  Procedure Laterality Date  . 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 BREAST RECONSTRUCTION    . COLONOSCOPY      Family History  Problem  Relation Age of Onset  . Lung cancer Father   . Arthritis Mother   . Osteoporosis Mother   . Dementia Mother   . Breast cancer Maternal Aunt   . Breast cancer Paternal Aunt   . Breast cancer Maternal Aunt   . Osteoporosis Maternal Grandmother   . Colon cancer Neg Hx   . Rectal cancer Neg Hx   . Stomach cancer Neg Hx     New complaints: Has been seeing dermatology for pruritis. She was told she has some form of eczema  Social history:her on is handicap ( Has MD ) and lives with her  Controlled substance contract: n/a     Review of Systems  Constitutional: Negative for diaphoresis.  Eyes: Negative for pain.  Respiratory: Negative for shortness of breath.   Cardiovascular: Negative for chest pain, palpitations and leg swelling.  Gastrointestinal: Negative for abdominal pain.  Endocrine: Negative for polydipsia.  Skin: Negative for rash.  Neurological: Negative for dizziness, weakness and headaches.  Hematological: Does not bruise/bleed easily.  All other systems reviewed and are negative.      Objective:   Physical Exam Vitals and nursing note reviewed.  Constitutional:      General: She is not in acute distress.    Appearance: Normal appearance. She is well-developed.  HENT:     Head: Normocephalic.     Nose: Nose normal.  Eyes:     Pupils: Pupils are equal, round, and reactive to light.  Neck:     Vascular: No carotid bruit or JVD.  Cardiovascular:     Rate and Rhythm: Normal rate and regular rhythm.     Heart sounds: Normal heart sounds.  Pulmonary:     Effort: Pulmonary effort is normal. No respiratory distress.     Breath sounds: Normal breath sounds. No wheezing or rales.  Chest:     Chest wall: No tenderness.  Abdominal:     General: Bowel sounds are normal. There is no distension or abdominal bruit.     Palpations: Abdomen is soft. There is no hepatomegaly, splenomegaly, mass or pulsatile mass.     Tenderness: There is no abdominal tenderness.    Musculoskeletal:        General: Normal range of motion.     Cervical back: Normal range of motion and neck supple.  Lymphadenopathy:     Cervical: No cervical adenopathy.  Skin:    General: Skin is warm and dry.  Neurological:     Mental Status: She is alert and oriented to person, place, and time.     Deep Tendon Reflexes: Reflexes are normal and symmetric.  Psychiatric:        Behavior: Behavior normal.        Thought Content: Thought content normal.        Judgment: Judgment normal.    BP 114/67   Pulse 70   Temp 98.2 F (36.8 C) (Temporal)   Resp 20   Ht 5\' 2"  (1.575 m)   Wt 193 lb (87.5 kg)   SpO2 99%   BMI 35.30 kg/m        Assessment & Plan:  Joel A Hoelzer comes in today with chief complaint of Medical Management of Chronic Issues   Diagnosis and orders addressed:  1. Migraine without aura and with status migrainosus, not intractable Avoid caffeine - SUMAtriptan (IMITREX) 100 MG tablet; Take 1 tablet (100 mg total) by mouth as needed. May repeat in 2 hours if headache persists or recurs.  Dispense: 9 tablet; Refill: 5  2. COPD with chronic bronchitis ([facility]) Continue inhalers - tiotropium (SPIRIVA HANDIHALER) 18 MCG inhalation capsule; Place 1 capsule (18 mcg total) into inhaler and inhale daily.  Dispense: 30 capsule; Refill: 5 - Fluticasone-Salmeterol (ADVAIR) 250-50 MCG/DOSE AEPB; Inhale 1 puff into the lungs every 12 (twelve) hours.  Dispense: 60 each; Refill: 5  3. Graves disease - methimazole (TAPAZOLE) 5 MG tablet; TAKE 1/2 TABLET BY MOUTH DAILY  Dispense: 90 tablet; Refill: 1  4. Osteopenia, unspecified location Weight bearing exercises encouraged Will do dexacan when machine is repaired  5. Recurrent major depressive disorder, in full remission ([place]) Stress management - escitalopram (LEXAPRO) 20 MG tablet; Take 1 tablet (20 mg total) by mouth daily. Needs appointment for future refills.  Dispense: 90 tablet; Refill: 1  6. Primary  insomnia Bedtime routine  7. Mixed hyperlipidemia Low fat diet - simvastatin (ZOCOR) 40 MG tablet; TAKE 1 TABLET (40 MG TOTAL) BY MOUTH DAILY.  Dispense: 90 tablet; Refill: 1  8. Vitamin D deficiency Continue daily vitamin d supplement  9. Urinary urgency - tolterodine (DETROL LA) 4 MG 24 hr capsule; TAKE 1 CAPSULE BY MOUTH EVERY DAY  Dispense: 90 capsule; Refill: 1  10. Smoker Smoking cessation encouraged  11. BMI 30.0-30.9,adult Discussed diet and exercise for person with BMI >25 Will recheck weight in 3-6 months   12. Essential hypertension Low odium diet - hydrochlorothiazide (HYDRODIURIL) 25 MG tablet; Take 1 tablet (25 mg total) by mouth daily.  Dispense: 90 tablet; Refill: 1  13. Gastroesophageal reflux disease Avoid spicy foods Do not eat 2 hours prior to bedtime - omeprazole (PRILOSEC) 20 MG capsule; Take 1 capsule (20 mg total) by mouth 2 (two) times daily before a meal.  Dispense: 180 capsule; Refill: 1   Meds ordered this encounter  Medications  . tiotropium (SPIRIVA HANDIHALER) 18 MCG inhalation capsule    Sig: Place 1 capsule (18 mcg total) into inhaler and inhale daily.    Dispense:  30 capsule    Refill:  5    Order Specific Question:   Supervising Provider    Answer:   Caryl Pina A [id number]  . Fluticasone-Salmeterol (ADVAIR) 250-50 MCG/DOSE AEPB    Sig: Inhale 1 puff into the lungs every 12 (twelve) hours.    Dispense:  60 each    Refill:  5    Order Specific Question:   Supervising Provider    Answer:   Caryl Pina A [id number]  . hydrochlorothiazide (HYDRODIURIL) 25 MG tablet    Sig: Take 1 tablet (25 mg total) by mouth daily.    Dispense:  90 tablet  Refill:  1    Order Specific Question:   Supervising Provider    Answer:   Caryl Pina A [id number]  . omeprazole (PRILOSEC) 20 MG capsule    Sig: Take 1 capsule (20 mg total) by mouth 2 (two) times daily before a meal.    Dispense:  180 capsule    Refill:  1    Order Specific  Question:   Supervising Provider    Answer:   Caryl Pina A [id number]  . methimazole (TAPAZOLE) 5 MG tablet    Sig: TAKE 1/2 TABLET BY MOUTH DAILY    Dispense:  90 tablet    Refill:  1    Order Specific Question:   Supervising Provider    Answer:   Caryl Pina A [id number]  . SUMAtriptan (IMITREX) 100 MG tablet    Sig: Take 1 tablet (100 mg total) by mouth as needed. May repeat in 2 hours if headache persists or recurs.    Dispense:  9 tablet    Refill:  5    DX Code Needed  PT WOULD LIKE REFILLS PLEASE.    Order Specific Question:   Supervising Provider    Answer:   Caryl Pina A [id number]  . simvastatin (ZOCOR) 40 MG tablet    Sig: TAKE 1 TABLET (40 MG TOTAL) BY MOUTH DAILY.    Dispense:  90 tablet    Refill:  1    Order Specific Question:   Supervising Provider    Answer:   Caryl Pina A [id number]  . escitalopram (LEXAPRO) 20 MG tablet    Sig: Take 1 tablet (20 mg total) by mouth daily. Needs appointment for future refills.    Dispense:  90 tablet    Refill:  1    Order Specific Question:   Supervising Provider    Answer:   Caryl Pina A [id number]  . tolterodine (DETROL LA) 4 MG 24 hr capsule    Sig: TAKE 1 CAPSULE BY MOUTH EVERY DAY    Dispense:  90 capsule    Refill:  1    Order Specific Question:   Supervising Provider    Answer:   Caryl Pina A [id number]  . meloxicam (MOBIC) 15 MG tablet    Sig: Take 1 tablet (15 mg total) by mouth daily. Needs to be seen before next refill.    Dispense:  90 tablet    Refill:  1    Order Specific Question:   Supervising Provider    Answer:   Caryl Pina A [id number]    Labs pending Health Maintenance reviewed Diet and exercise encouraged  Follow up plan: 6 months   [name], FNP

## 2019-10-12 NOTE — Addendum Note (Signed)
Addended by: Chevis Pretty on: 10/12/2019 10:41 AM   Modules accepted: Orders

## 2019-10-12 NOTE — Patient Instructions (Signed)
Stress, Adult Stress is a normal reaction to life events. Stress is what you feel when life demands more than you are used to, or more than you think you can handle. Some stress can be useful, such as studying for a test or meeting a deadline at work. Stress that occurs too often or for too long can cause problems. It can affect your emotional health and interfere with relationships and normal daily activities. Too much stress can weaken your body's defense system (immune system) and increase your risk for physical illness. If you already have a medical problem, stress can make it worse. What are the causes? All sorts of life events can cause stress. An event that causes stress for one person may not be stressful for another person. Major life events, whether positive or negative, commonly cause stress. Examples include:  Losing a job or starting a new job.  Losing a loved one.  Moving to a new town or home.  Getting married or divorced.  Having a baby.  Getting injured or sick. Less obvious life events can also cause stress, especially if they occur day after day or in combination with each other. Examples include:  Working long hours.  Driving in traffic.  Caring for children.  Being in debt.  Being in a difficult relationship. What are the signs or symptoms? Stress can cause emotional symptoms, including:  Anxiety. This is feeling worried, afraid, on edge, overwhelmed, or out of control.  Anger, including irritation or impatience.  Depression. This is feeling sad, down, helpless, or guilty.  Trouble focusing, remembering, or making decisions. Stress can cause physical symptoms, including:  Aches and pains. These may affect your head, neck, back, stomach, or other areas of your body.  Tight muscles or a clenched jaw.  Low energy.  Trouble sleeping. Stress can cause unhealthy behaviors, including:  Eating to feel better (overeating) or skipping meals.  Working too  much or putting off tasks.  Smoking, drinking alcohol, or using drugs to feel better. How is this diagnosed? Stress is diagnosed through an assessment by your health care provider. He or she may diagnose this condition based on:  Your symptoms and any stressful life events.  Your medical history.  Tests to rule out other causes of your symptoms. Depending on your condition, your health care provider may refer you to a specialist for further evaluation. How is this treated?  Stress management techniques are the recommended treatment for stress. Medicine is not typically recommended for the treatment of stress. Techniques to reduce your reaction to stressful life events include:  Stress identification. Monitor yourself for symptoms of stress and identify what causes stress for you. These skills may help you to avoid or prepare for stressful events.  Time management. Set your priorities, keep a calendar of events, and learn to say no. Taking these actions can help you avoid making too many commitments. Techniques for coping with stress include:  Rethinking the problem. Try to think realistically about stressful events rather than ignoring them or overreacting. Try to find the positives in a stressful situation rather than focusing on the negatives.  Exercise. Physical exercise can release both physical and emotional tension. The key is to find a form of exercise that you enjoy and do it regularly.  Relaxation techniques. These relax the body and mind. The key is to find one or more that you enjoy and use the techniques regularly. Examples include: ? Meditation, deep breathing, or progressive relaxation techniques. ? Yoga or   tai chi. ? Biofeedback, mindfulness techniques, or journaling. ? Listening to music, being out in nature, or participating in other hobbies.  Practicing a healthy lifestyle. Eat a balanced diet, drink plenty of water, limit or avoid caffeine, and get plenty of  sleep.  Having a strong support network. Spend time with family, friends, or other people you enjoy being around. Express your feelings and talk things over with someone you trust. Counseling or talk therapy with a mental health professional may be helpful if you are having trouble managing stress on your own. Follow these instructions at home: Lifestyle   Avoid drugs.  Do not use any products that contain nicotine or tobacco, such as cigarettes, e-cigarettes, and chewing tobacco. If you need help quitting, ask your health care provider.  Limit alcohol intake to no more than 1 drink a day for nonpregnant women and 2 drinks a day for men. One drink equals 12 oz of beer, 5 oz of wine, or 1 oz of hard liquor  Do not use alcohol or drugs to relax.  Eat a balanced diet that includes fresh fruits and vegetables, whole grains, lean meats, fish, eggs, and beans, and low-fat dairy. Avoid processed foods and foods high in added fat, sugar, and salt.  Exercise at least 30 minutes on 5 or more days each week.  Get 7-8 hours of sleep each night. General instructions   Practice stress management techniques as discussed with your health care provider.  Drink enough fluid to keep your urine clear or pale yellow.  Take over-the-counter and prescription medicines only as told by your health care provider.  Keep all follow-up visits as told by your health care provider. This is important. Contact a health care provider if:  Your symptoms get worse.  You have new symptoms.  You feel overwhelmed by your problems and can no longer manage them on your own. Get help right away if:  You have thoughts of hurting yourself or others. If you ever feel like you may hurt yourself or others, or have thoughts about taking your own life, get help right away. You can go to your nearest emergency department or call:  Your local emergency services (911 in the [place]  A suicide crisis helpline, such as the  [place] at (316) 250-6172. This is open 24 hours a day. Summary  Stress is a normal reaction to life events. It can cause problems if it happens too often or for too long.  Practicing stress management techniques is the best way to treat stress.  Counseling or talk therapy with a mental health professional may be helpful if you are having trouble managing stress on your own. This information is not intended to replace advice given to you by your health care provider. Make sure you discuss any questions you have with your health care provider. Document Revised: 12/04/2018 Document Reviewed: 06/26/2016 Elsevier Patient Education  [place].

## 2019-10-13 LAB — CMP14+EGFR
ALT: 14 IU/L (ref 0–32)
AST: 20 IU/L (ref 0–40)
Albumin/Globulin Ratio: 1.7 (ref 1.2–2.2)
Albumin: 4.1 g/dL (ref 3.8–4.8)
Alkaline Phosphatase: 83 IU/L (ref 48–121)
BUN/Creatinine Ratio: 21 (ref 12–28)
BUN: 16 mg/dL (ref 8–27)
Bilirubin Total: 0.3 mg/dL (ref 0.0–1.2)
CO2: 23 mmol/L (ref 20–29)
Calcium: 9.3 mg/dL (ref 8.7–10.3)
Chloride: 93 mmol/L — ABNORMAL LOW (ref 96–106)
Creatinine, Ser: 0.78 mg/dL (ref 0.57–1.00)
GFR calc Af Amer: 90 mL/min/{1.73_m2} (ref >59)
GFR calc non Af Amer: 78 mL/min/{1.73_m2} (ref >59)
Globulin, Total: 2.4 g/dL (ref 1.5–4.5)
Glucose: 88 mg/dL (ref 65–99)
Potassium: 4.4 mmol/L (ref 3.5–5.2)
Sodium: 131 mmol/L — ABNORMAL LOW (ref 134–144)
Total Protein: 6.5 g/dL (ref 6.0–8.5)

## 2019-10-13 LAB — CBC WITH DIFFERENTIAL/PLATELET
Basophils Absolute: 0 10*3/uL (ref 0.0–0.2)
Basos: 1 %
EOS (ABSOLUTE): 0.3 10*3/uL (ref 0.0–0.4)
Eos: 5 %
Hematocrit: 39.6 % (ref 34.0–46.6)
Hemoglobin: 13.5 g/dL (ref 11.1–15.9)
Immature Grans (Abs): 0 10*3/uL (ref 0.0–0.1)
Immature Granulocytes: 0 %
Lymphocytes Absolute: 1.8 10*3/uL (ref 0.7–3.1)
Lymphs: 29 %
MCH: 32.6 pg (ref 26.6–33.0)
MCHC: 34.1 g/dL (ref 31.5–35.7)
MCV: 96 fL (ref 79–97)
Monocytes Absolute: 0.7 10*3/uL (ref 0.1–0.9)
Monocytes: 11 %
Neutrophils Absolute: 3.4 10*3/uL (ref 1.4–7.0)
Neutrophils: 54 %
Platelets: 320 10*3/uL (ref 150–450)
RBC: 4.14 x10E6/uL (ref 3.77–5.28)
RDW: 12.1 % (ref 11.7–15.4)
WBC: 6.2 10*3/uL (ref 3.4–10.8)

## 2019-10-13 LAB — LIPID PANEL
Chol/HDL Ratio: 2.8 ratio (ref 0.0–4.4)
Cholesterol, Total: 143 mg/dL (ref 100–199)
HDL: 52 mg/dL (ref >39)
LDL Chol Calc (NIH): 76 mg/dL (ref 0–99)
Triglycerides: 74 mg/dL (ref 0–149)
VLDL Cholesterol Cal: 15 mg/dL (ref 5–40)

## 2019-10-13 LAB — THYROID PANEL WITH TSH
Free Thyroxine Index: 2.3 (ref 1.2–4.9)
T3 Uptake Ratio: 26 % (ref 24–39)
T4, Total: 8.8 ug/dL (ref 4.5–12.0)
TSH: 1.48 u[IU]/mL (ref 0.450–4.500)

## 2019-11-01 ENCOUNTER — Encounter: Payer: Self-pay | Admitting: Gastroenterology

## 2019-12-30 ENCOUNTER — Ambulatory Visit (AMBULATORY_SURGERY_CENTER): Payer: Self-pay | Admitting: *Deleted

## 2019-12-30 ENCOUNTER — Other Ambulatory Visit: Payer: Self-pay

## 2019-12-30 VITALS — Ht 62.0 in | Wt 189.0 lb

## 2019-12-30 DIAGNOSIS — Z86010 Personal history of colonic polyps: Secondary | ICD-10-CM

## 2019-12-30 MED ORDER — SUPREP BOWEL PREP KIT 17.5-3.13-1.6 GM/177ML PO SOLN: 1.0000 | Freq: Once | ORAL | 0 refills | Status: AC

## 2019-12-30 NOTE — Progress Notes (Signed)
cov vax x 2  No egg or soy allergy known to patient  No issues with past sedation with any surgeries or procedures- pt states hard to wake post op from mastectomy  no intubation problems in the past  No FH of Malignant Hyperthermia No diet pills per patient No home 02 use per patient  No blood thinners per patient  Pt denies issues with constipation  No A fib or A flutter  EMMI video to pt or via [place] 19 guidelines implemented in PV today with Pt and RN    Due to the COVID-19 pandemic we are asking patients to follow these guidelines. Please only bring one care partner. Please be aware that your care partner may wait in the car in the parking lot or if they feel like they will be too hot to wait in the car, they may wait in the lobby on the 4th floor. All care partners are required to wear a mask the entire time (we do not have any that we can provide them), they need to practice social distancing, and we will do a Covid check for all patient's and care partners when you arrive. Also we will check their temperature and your temperature. If the care partner waits in their car they need to stay in the parking lot the entire time and we will call them on their cell phone when the patient is ready for discharge so they can bring the car to the front of the building. Also all patient's will need to wear a mask into building.

## 2020-01-13 ENCOUNTER — Other Ambulatory Visit: Payer: Self-pay

## 2020-01-13 ENCOUNTER — Ambulatory Visit (AMBULATORY_SURGERY_CENTER): Payer: Medicare Other | Admitting: Gastroenterology

## 2020-01-13 ENCOUNTER — Encounter: Payer: Self-pay | Admitting: Gastroenterology

## 2020-01-13 VITALS — BP 117/57 | HR 65 | Temp 97.6°F | Resp 13 | Ht 62.0 in | Wt 189.0 lb

## 2020-01-13 DIAGNOSIS — Z86010 Personal history of colonic polyps: Secondary | ICD-10-CM

## 2020-01-13 DIAGNOSIS — D124 Benign neoplasm of descending colon: Secondary | ICD-10-CM

## 2020-01-13 DIAGNOSIS — K635 Polyp of colon: Secondary | ICD-10-CM | POA: Diagnosis not present

## 2020-01-13 DIAGNOSIS — Z1211 Encounter for screening for malignant neoplasm of colon: Secondary | ICD-10-CM | POA: Diagnosis not present

## 2020-01-13 MED ORDER — SODIUM CHLORIDE 0.9 % IV SOLN: 500.0000 mL | Freq: Once | INTRAVENOUS | Status: DC

## 2020-01-13 NOTE — Progress Notes (Signed)
A and O x3. Report to RN. Tolerated MAC anesthesia well.

## 2020-01-13 NOTE — Op Note (Signed)
[place] Patient Name: Sonja Manseau Procedure Date: 01/13/2020 9:28 AM MRN: [id number] Endoscopist: Thornton Park MD, MD Age: 69 Referring MD:  Date of Birth: 1950-10-21 Gender: Female Account #: [id number] Procedure:                Colonoscopy Indications:              High risk colon cancer surveillance: Personal                            history of sessile serrated colon polyp (less than                            10 mm in size) with no dysplasia                           Sessile serrated polyp on colonoscopy with Dr.                            Deatra Ina 04/29/14                           No known family history of colon cancer or polyps Medicines:                Monitored Anesthesia Care Procedure:                Pre-Anesthesia Assessment:                           - Prior to the procedure, a History and Physical                            was performed, and patient medications and                            allergies were reviewed. The patient's tolerance of                            previous anesthesia was also reviewed. The risks                            and benefits of the procedure and the sedation                            options and risks were discussed with the patient.                            All questions were answered, and informed consent                            was obtained. Prior Anticoagulants: The patient has                            taken no previous anticoagulant or antiplatelet  agents. ASA Grade Assessment: II - A patient with                            mild systemic disease. After reviewing the risks                            and benefits, the patient was deemed in                            satisfactory condition to undergo the procedure.                           After obtaining informed consent, the colonoscope                            was passed under direct vision. Throughout the                             procedure, the patient's blood pressure, pulse, and                            oxygen saturations were monitored continuously. The                            Colonoscope was introduced through the anus and                            advanced to the 3 cm into the ileum. A second                            forward view of the right colon was performed. The                            colonoscopy was performed without difficulty. The                            patient tolerated the procedure well. The quality                            of the bowel preparation was good. The terminal                            ileum, ileocecal valve, appendiceal orifice, and                            rectum were photographed. Scope In: 6:01:09 AM Scope Out: 9:50:27 AM Scope Withdrawal Time: 0 hours 11 minutes 44 seconds  Total Procedure Duration: 0 hours 13 minutes 43 seconds  Findings:                 The perianal and digital rectal examinations were                            normal.  Multiple small and large-mouthed diverticula were                            found in the sigmoid colon and descending colon. A                            single diverticulum was seen in the ascending colon.                           A 1 mm polyp was found in the descending colon. The                            polyp was flat. The polyp was removed with a cold                            biopsy forceps. Resection and retrieval were                            complete. Estimated blood loss was minimal.                           The exam was otherwise without abnormality on                            direct and retroflexion views. Complications:            No immediate complications. Estimated blood loss:                            Minimal. Estimated Blood Loss:     Estimated blood loss was minimal. Impression:               - Diverticulosis in the sigmoid colon and in the                             descending colon.                           - One 1 mm polyp in the descending colon, removed                            with a cold biopsy forceps. Resected and retrieved.                           - The examination was otherwise normal on direct                            and retroflexion views. Recommendation:           - Patient has a contact number available for                            emergencies. The signs and symptoms of potential  delayed complications were discussed with the                            patient. Return to normal activities tomorrow.                            Written discharge instructions were provided to the                            patient.                           - Follow a high fiber diet. Drink at least 64                            ounces of water daily. Add a daily stool bulking                            agent such as psyllium (an exampled would be                            Metamucil).                           - Continue present medications.                           - Await pathology results.                           - Repeat colonoscopy date to be determined after                            pending pathology results are reviewed for                            surveillance.                           - Emerging evidence supports eating a diet of                            fruits, vegetables, grains, calcium, and yogurt                            while reducing red meat and alcohol may reduce the                            risk of colon cancer.                           - Thank you for allowing me to be involved in your                            colon cancer prevention. Thornton Park MD, MD 01/13/2020 9:55:32 AM This  report has been signed electronically.

## 2020-01-13 NOTE — Progress Notes (Signed)
Called to room to assist during endoscopic procedure.  Patient ID and intended procedure confirmed with present staff. Received instructions for my participation in the procedure from the performing physician.  

## 2020-01-13 NOTE — Progress Notes (Signed)
Vital signs checked by:CW  The patient states no changes in medical or surgical history since pre-visit screening on 12/30/19.

## 2020-01-13 NOTE — Patient Instructions (Signed)

## 2020-01-17 ENCOUNTER — Telehealth: Payer: Self-pay

## 2020-01-17 NOTE — Telephone Encounter (Signed)
°  Follow up Call-  Call back number 01/13/2020  Post procedure Call Back phone  # [phone]  Permission to leave phone message Yes  Some recent data might be hidden     Patient questions:  Do you have a fever, pain , or abdominal swelling? No. Pain Score  0  Have you tolerated food without any problems? No.  Have you been able to return to your normal activities? Yes.    Do you have any questions about your discharge instructions: Diet   No. Medications  No. Follow up visit  No.  Do you have questions or concerns about your Care? No.  Actions: * If pain score is 4 or above: No action needed, pain <4.  1. Have you developed a fever since your procedure? no  2.   Have you had an respiratory symptoms (SOB or cough) since your procedure? no  3.   Have you tested positive for COVID 19 since your procedure no  4.   Have you had any family members/close contacts diagnosed with the COVID 19 since your procedure?  no   If yes to any of these questions please route to Joylene John, RN and Joella Prince, RN

## 2020-01-27 ENCOUNTER — Encounter: Payer: Self-pay | Admitting: Gastroenterology

## 2020-02-17 DIAGNOSIS — L570 Actinic keratosis: Secondary | ICD-10-CM | POA: Diagnosis not present

## 2020-02-17 DIAGNOSIS — L309 Dermatitis, unspecified: Secondary | ICD-10-CM | POA: Diagnosis not present

## 2020-03-02 DIAGNOSIS — R918 Other nonspecific abnormal finding of lung field: Secondary | ICD-10-CM | POA: Diagnosis not present

## 2020-03-02 DIAGNOSIS — Z170 Estrogen receptor positive status [ER+]: Secondary | ICD-10-CM | POA: Diagnosis not present

## 2020-03-02 DIAGNOSIS — Z9882 Breast implant status: Secondary | ICD-10-CM | POA: Diagnosis not present

## 2020-03-02 DIAGNOSIS — C50412 Malignant neoplasm of upper-outer quadrant of left female breast: Secondary | ICD-10-CM | POA: Diagnosis not present

## 2020-03-02 DIAGNOSIS — Z9013 Acquired absence of bilateral breasts and nipples: Secondary | ICD-10-CM | POA: Diagnosis not present

## 2020-03-02 DIAGNOSIS — F17210 Nicotine dependence, cigarettes, uncomplicated: Secondary | ICD-10-CM | POA: Diagnosis not present

## 2020-03-27 ENCOUNTER — Ambulatory Visit: Payer: Medicare Other | Admitting: *Deleted

## 2020-03-27 DIAGNOSIS — J449 Chronic obstructive pulmonary disease, unspecified: Secondary | ICD-10-CM

## 2020-03-27 DIAGNOSIS — F17200 Nicotine dependence, unspecified, uncomplicated: Secondary | ICD-10-CM

## 2020-03-27 DIAGNOSIS — F3342 Major depressive disorder, recurrent, in full remission: Secondary | ICD-10-CM

## 2020-03-27 NOTE — Chronic Care Management (AMB) (Signed)
Chronic Care Management   Initial Visit Note  03/27/2020 Name: Barbara Boyd MRN: [id number] DOB: 08/07/1950  Referred by: Barbara Pretty, FNP Reason for referral : No chief complaint on file.   Barbara Boyd is a 69 y.o. year old female who is a primary care patient of Barbara Pretty, FNP. The CCM team was consulted for assistance with chronic disease management and care coordination needs related to HLD, COPD, Depression and osteopenia  Review of patient status, including review of consultants reports, relevant laboratory and other test results, and collaboration with appropriate care team members and the patient's provider was performed as part of comprehensive patient evaluation and provision of chronic care management services.    I spoke with [name] by telephone today regarding management of her chronic medical conditions. She is overdue for a bone density scan and screening CT Scan. She does report an increase in SOB over the past year. She is noncompliant with her maintenance inhalers. Primarily because she forgets to use them, but cost is also a factor. She is still smoking and smoking cessation and increasing physical activity was recommended. She appreciated the outreach and recommendations but does want to participate in the CCM program at this time. She will reach out in the future if services are desired.  SDOH (Social Determinants of Health) assessments performed: Yes See Care Plan activities for detailed interventions related to SDOH  SDOH Interventions     Most Recent Value  SDOH Interventions  Physical Activity Interventions Other (Comments)  [[employer] [place], daily walks]  Stress Interventions Patient Refused  [Recommended consult with LCSW]  Depression Interventions/Treatment  Medication, Currently on Treatment  [recommended to talk with LCSW]      Tobacco Use                              Smoking cessation discussed       Medication Cost                          Recommended she talk with PCP about cost of inhalers. Consult             with PharmD about other options or Rx assistance   Medications: Outpatient Encounter Medications as of 03/27/2020  Medication Sig Note  . Calcipotriene-Betameth Diprop (ENSTILAR EX) Apply topically. Sample from MD   . CALCIUM PO Take 500 mg by mouth daily. Gummies   . escitalopram (LEXAPRO) 20 MG tablet Take 1 tablet (20 mg total) by mouth daily. Needs appointment for future refills.   . Fluticasone-Salmeterol (ADVAIR) 250-50 MCG/DOSE AEPB Inhale 1 puff into the lungs every 12 (twelve) hours. 03/27/2020: 03/27/2020 Per patient she is not using this daily or even weekly sometimes  . hydrochlorothiazide (HYDRODIURIL) 25 MG tablet Take 1 tablet (25 mg total) by mouth daily.   . meloxicam (MOBIC) 15 MG tablet Take 1 tablet (15 mg total) by mouth daily. Needs to be seen before next refill.   . methimazole (TAPAZOLE) 5 MG tablet TAKE 1/2 TABLET BY MOUTH DAILY   . omeprazole (PRILOSEC) 20 MG capsule Take 1 capsule (20 mg total) by mouth 2 (two) times daily before a meal. (Patient taking differently: Take 20 mg by mouth daily. )   . simvastatin (ZOCOR) 40 MG tablet TAKE 1 TABLET (40 MG TOTAL) BY MOUTH DAILY.   . SUMAtriptan (IMITREX) 100 MG tablet Take 1 tablet (100  mg total) by mouth as needed. May repeat in 2 hours if headache persists or recurs.   Marland Kitchen tiotropium (SPIRIVA HANDIHALER) 18 MCG inhalation capsule Place 1 capsule (18 mcg total) into inhaler and inhale daily. 03/27/2020: 03/27/20 Per patient she is not using this daily or even weekly sometimes  . tolterodine (DETROL LA) 4 MG 24 hr capsule TAKE 1 CAPSULE BY MOUTH EVERY DAY   . triamcinolone ointment (KENALOG) 0.1 % APPLY TWICE A DAY FOR 2 WEEKS   . zolpidem (AMBIEN) 10 MG tablet Take 1 tablet (10 mg total) by mouth at bedtime as needed for sleep. (Patient not taking: Reported on 12/30/2019)    No facility-administered encounter  medications on file as of 03/27/2020.     BP Readings from Last 3 Encounters:  01/13/20 (!) 117/57  10/12/19 114/67  04/27/19 (!) 145/82   Lab Results  Component Value Date   CHOL 143 10/12/2019   HDL 52 10/12/2019   LDLCALC 76 10/12/2019   TRIG 74 10/12/2019   CHOLHDL 2.8 10/12/2019   Lab Results  Component Value Date   TSH 1.480 10/12/2019     Plan:   The patient has been provided with contact information for the care management team and has been advised to call with any health related questions or concerns.   Next PCP appointment scheduled for: 04/17/2020  Talk with PCP about inhaler usage and cost  Dexa scan recommended at PCP visit on 04/17/20  Screening CT chest recommended  Stop smoking  Increase physical activity level. Start slowly by walking 10 or 15 min a day with a goal of 150 min a week. Recommended [employer] for socialization and physical activity  CCM enrollment status changed to "previously enrolled" as per patient request on 03/27/2020 to discontinue enrollment. Case closed to case management services in primary care home.   I will send these recommendations to PCP, Barbara Pretty, FNP.  Chong Sicilian, BSN, RN-BC Embedded Chronic Care Manager [facility] [place] Management Direct Dial: [phone]

## 2020-03-27 NOTE — Patient Instructions (Signed)
Plan:   The patient has been provided with contact information for the care management team and has been advised to call with any health related questions or concerns.   Next PCP appointment scheduled for: 04/17/2020  Talk with PCP about inhaler usage and cost  Dexa scan recommended at PCP visit on 04/17/20  Screening CT chest recommended  Stop smoking  Increase physical activity level. Start slowly by walking 10 or 15 min a day with a goal of 150 min a week. Recommended [employer] for socialization and physical activity  CCM enrollment status changed to "previously enrolled" as per patient request on 03/27/2020 to discontinue enrollment. Case closed to case management services in primary care home.   I will send these recommendations to PCP, Chevis Pretty, FNP   Chong Sicilian, BSN, RN-BC [place] [facility] [place] Management Direct Dial: [phone]

## 2020-04-07 ENCOUNTER — Other Ambulatory Visit: Payer: Self-pay | Admitting: Nurse Practitioner

## 2020-04-07 DIAGNOSIS — F3342 Major depressive disorder, recurrent, in full remission: Secondary | ICD-10-CM

## 2020-04-07 DIAGNOSIS — I10 Essential (primary) hypertension: Secondary | ICD-10-CM

## 2020-04-17 ENCOUNTER — Other Ambulatory Visit: Payer: Medicare Other

## 2020-04-17 ENCOUNTER — Ambulatory Visit: Payer: Self-pay | Admitting: Nurse Practitioner

## 2020-05-17 ENCOUNTER — Ambulatory Visit (INDEPENDENT_AMBULATORY_CARE_PROVIDER_SITE_OTHER): Payer: Medicare Other

## 2020-05-17 ENCOUNTER — Other Ambulatory Visit: Payer: Self-pay

## 2020-05-17 DIAGNOSIS — Z23 Encounter for immunization: Secondary | ICD-10-CM | POA: Diagnosis not present

## 2020-05-17 NOTE — Progress Notes (Signed)
   Covid-19 Vaccination Clinic  Name:  [name]    MRN: [id number] DOB: 08-Jan-1951  05/17/2020  [name] was observed post Covid-19 immunization for 15 minutes without incident. She was provided with Vaccine Information Sheet and instruction to access the V-Safe system.   [name] was instructed to call 911 with any severe reactions post vaccine: Marland Kitchen Difficulty breathing  . Swelling of face and throat  . A fast heartbeat  . A bad rash all over body  . Dizziness and weakness   Immunizations Administered    Name Date Dose VIS Date Route   Moderna Covid-19 Booster Vaccine 05/17/2020  9:10 AM 0.25 mL 03/08/2020 Intramuscular   Manufacturer: Gala Murdoch   Lot: [id number]   NDC: 97416-384-53

## 2020-05-23 ENCOUNTER — Encounter: Payer: Self-pay | Admitting: Nurse Practitioner

## 2020-05-23 ENCOUNTER — Ambulatory Visit (INDEPENDENT_AMBULATORY_CARE_PROVIDER_SITE_OTHER): Payer: Medicare Other

## 2020-05-23 ENCOUNTER — Other Ambulatory Visit: Payer: Self-pay

## 2020-05-23 ENCOUNTER — Ambulatory Visit: Payer: Medicare Other | Admitting: Nurse Practitioner

## 2020-05-23 VITALS — BP 134/72 | HR 63 | Temp 97.7°F | Resp 20 | Ht 62.0 in | Wt 196.0 lb

## 2020-05-23 DIAGNOSIS — E782 Mixed hyperlipidemia: Secondary | ICD-10-CM | POA: Diagnosis not present

## 2020-05-23 DIAGNOSIS — F5101 Primary insomnia: Secondary | ICD-10-CM

## 2020-05-23 DIAGNOSIS — G43001 Migraine without aura, not intractable, with status migrainosus: Secondary | ICD-10-CM | POA: Diagnosis not present

## 2020-05-23 DIAGNOSIS — J449 Chronic obstructive pulmonary disease, unspecified: Secondary | ICD-10-CM

## 2020-05-23 DIAGNOSIS — Z6830 Body mass index (BMI) 30.0-30.9, adult: Secondary | ICD-10-CM

## 2020-05-23 DIAGNOSIS — K219 Gastro-esophageal reflux disease without esophagitis: Secondary | ICD-10-CM

## 2020-05-23 DIAGNOSIS — E559 Vitamin D deficiency, unspecified: Secondary | ICD-10-CM

## 2020-05-23 DIAGNOSIS — F17200 Nicotine dependence, unspecified, uncomplicated: Secondary | ICD-10-CM

## 2020-05-23 DIAGNOSIS — I10 Essential (primary) hypertension: Secondary | ICD-10-CM

## 2020-05-23 DIAGNOSIS — E0500 Thyrotoxicosis with diffuse goiter without thyrotoxic crisis or storm: Secondary | ICD-10-CM

## 2020-05-23 DIAGNOSIS — M8588 Other specified disorders of bone density and structure, other site: Secondary | ICD-10-CM

## 2020-05-23 DIAGNOSIS — J4489 Other specified chronic obstructive pulmonary disease: Secondary | ICD-10-CM

## 2020-05-23 DIAGNOSIS — F3342 Major depressive disorder, recurrent, in full remission: Secondary | ICD-10-CM

## 2020-05-23 DIAGNOSIS — R3915 Urgency of urination: Secondary | ICD-10-CM

## 2020-05-23 MED ORDER — SIMVASTATIN 40 MG PO TABS: ORAL_TABLET | ORAL | 1 refills | Status: DC

## 2020-05-23 MED ORDER — TOLTERODINE TARTRATE ER 4 MG PO CP24: ORAL_CAPSULE | ORAL | 1 refills | Status: DC

## 2020-05-23 MED ORDER — SUMATRIPTAN SUCCINATE 100 MG PO TABS: 100.0000 mg | ORAL_TABLET | ORAL | 5 refills | Status: DC | PRN

## 2020-05-23 MED ORDER — ESCITALOPRAM OXALATE 20 MG PO TABS: 20.0000 mg | ORAL_TABLET | Freq: Every day | ORAL | 0 refills | Status: DC

## 2020-05-23 MED ORDER — FLUTICASONE-SALMETEROL 250-50 MCG/DOSE IN AEPB: 1.0000 | INHALATION_SPRAY | Freq: Two times a day (BID) | RESPIRATORY_TRACT | 5 refills | Status: DC

## 2020-05-23 MED ORDER — HYDROCHLOROTHIAZIDE 25 MG PO TABS
25.0000 mg | ORAL_TABLET | Freq: Every day | ORAL | 1 refills | Status: DC
Start: 2020-05-23 — End: 2020-11-23

## 2020-05-23 MED ORDER — METHIMAZOLE 5 MG PO TABS: ORAL_TABLET | ORAL | 1 refills | Status: DC

## 2020-05-23 MED ORDER — SPIRIVA HANDIHALER 18 MCG IN CAPS: 18.0000 ug | ORAL_CAPSULE | Freq: Every day | RESPIRATORY_TRACT | 5 refills | Status: DC

## 2020-05-23 MED ORDER — OMEPRAZOLE 20 MG PO CPDR
20.0000 mg | DELAYED_RELEASE_CAPSULE | Freq: Two times a day (BID) | ORAL | 1 refills | Status: DC
Start: 2020-05-23 — End: 2020-11-23

## 2020-05-23 NOTE — Progress Notes (Signed)
Subjective:    Patient ID: Barbara Boyd, female    DOB: 08-Feb-1951, 70 y.o.   MRN: [id number]   Chief Complaint: Medical Management of Chronic Issues    HPI:  1. Mixed hyperlipidemia Does not watch diet and does very little exercise. Lab Results  Component Value Date   CHOL 143 10/12/2019   HDL 52 10/12/2019   LDLCALC 76 10/12/2019   TRIG 74 10/12/2019   CHOLHDL 2.8 10/12/2019     2. Migraine without aura and with status migrainosus, not intractable Have gotten worse. Had a headache for 7 days n a row. Has gotten better today.  3. COPD with chronic bronchitis (HCC) Still on advair and spirivia. She doe snot use them everyday like she should. Has wheezes most of the time. Occasional cough.  4. Graves disease Takes methimazole daily. Denies any problems that she is aware of. Lab Results  Component Value Date   TSH 1.480 10/12/2019     5. Osteopenia of lumbar spine Last dexascan was in 2015. Will have repeated today.  6. Recurrent major depressive disorder, in full remission (HCC) Is on lexapro daily and is doing well. deneis nay medication side effects. Depression screen [facility] 2/9 05/23/2020 03/27/2020 10/12/2019 04/27/2019 10/22/2018  Decreased Interest 0 1 2 0 0  Down, Depressed, Hopeless 0 1 1 0 0  PHQ - 2 Score 0 2 3 0 0  Altered sleeping - 1 1 - -  Tired, decreased energy - - 3 - -  Change in appetite - 3 1 - -  Feeling bad or failure about yourself  - 0 0 - -  Trouble concentrating - 0 0 - -  Moving slowly or fidgety/restless - 0 0 - -  Suicidal thoughts - 0 0 - -  PHQ-9 Score - 6 8 - -  Difficult doing work/chores - Somewhat difficult Not difficult at all - -  Some recent data might be hidden     7. Primary insomnia She only takes ambienon occasion. She says she dan usually fall asleep, but staying asleep diifficult. She falls alseep a lot during the day.   8. Urinary urgency Is on detrol LA. Helps some with urinary frequeny  9. Smoker Smokes over a 2  packs a day. Even get sup in the middle of the night to smoke. Has no desire to quit smoking.  10. Vitamin D deficiency Is on daily vitamin d supplement  11. BMI 30.0-30.9,adult No recent weight changes Wt Readings from Last 3 Encounters:  05/23/20 196 lb (88.9 kg)  01/13/20 189 lb (85.7 kg)  12/30/19 189 lb (85.7 kg)   BMI Readings from Last 3 Encounters:  05/23/20 35.85 kg/m  01/13/20 34.57 kg/m  12/30/19 34.57 kg/m       Outpatient Encounter Medications as of 05/23/2020  Medication Sig  . Calcipotriene-Betameth Diprop (ENSTILAR EX) Apply topically. Sample from MD  . escitalopram (LEXAPRO) 20 MG tablet Take 1 tablet (20 mg total) by mouth daily.  . Fluticasone-Salmeterol (ADVAIR) 250-50 MCG/DOSE AEPB Inhale 1 puff into the lungs every 12 (twelve) hours.  . hydrochlorothiazide (HYDRODIURIL) 25 MG tablet TAKE 1 TABLET BY MOUTH EVERY DAY  . meloxicam (MOBIC) 15 MG tablet TAKE 1 TABLET (15 MG TOTAL) BY MOUTH DAILY. NEEDS TO BE SEEN BEFORE NEXT REFILL.  Marland Kitchen methimazole (TAPAZOLE) 5 MG tablet TAKE 1/2 TABLET BY MOUTH DAILY  . omeprazole (PRILOSEC) 20 MG capsule Take 1 capsule (20 mg total) by mouth 2 (two) times daily before a  meal. (Patient taking differently: Take 20 mg by mouth daily.)  . simvastatin (ZOCOR) 40 MG tablet TAKE 1 TABLET (40 MG TOTAL) BY MOUTH DAILY.  . SUMAtriptan (IMITREX) 100 MG tablet Take 1 tablet (100 mg total) by mouth as needed. May repeat in 2 hours if headache persists or recurs.  Marland Kitchen tiotropium (SPIRIVA HANDIHALER) 18 MCG inhalation capsule Place 1 capsule (18 mcg total) into inhaler and inhale daily.  Marland Kitchen tolterodine (DETROL LA) 4 MG 24 hr capsule TAKE 1 CAPSULE BY MOUTH EVERY DAY  . triamcinolone ointment (KENALOG) 0.1 % APPLY TWICE A DAY FOR 2 WEEKS  . zolpidem (AMBIEN) 10 MG tablet Take 1 tablet (10 mg total) by mouth at bedtime as needed for sleep. (Patient not taking: Reported on 12/30/2019)     Past Surgical History:  Procedure Laterality Date  .  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 BREAST RECONSTRUCTION    . COLONOSCOPY    . POLYPECTOMY      Family History  Problem Relation Age of Onset  . Lung cancer Father   . Arthritis Mother   . Osteoporosis Mother   . Dementia Mother   . Breast cancer Maternal Aunt   . Breast cancer Paternal Aunt   . Breast cancer Maternal Aunt   . Osteoporosis Maternal Grandmother   . Breast cancer Maternal Grandmother   . Colon cancer Neg Hx   . Rectal cancer Neg Hx   . Stomach cancer Neg Hx   . Colon polyps Neg Hx   . Heart disease Neg Hx     New complaints: None today  Social history: Lives with her husband  Controlled substance contract: n/a- doe snot take [place] daily.    Review of Systems  Constitutional: Negative for diaphoresis.  Eyes: Negative for pain.  Respiratory: Negative for shortness of breath.   Cardiovascular: Negative for chest pain, palpitations and leg swelling.  Gastrointestinal: Negative for abdominal pain.  Endocrine: Negative for polydipsia.  Skin: Negative for rash.  Neurological: Positive for headaches. Negative for dizziness and weakness.  Hematological: Does not bruise/bleed easily.  All other systems reviewed and are negative.      Objective:   Physical Exam Vitals and nursing note reviewed.  Constitutional:      General: She is not in acute distress.    Appearance: Normal appearance. She is well-developed and well-nourished.  HENT:     Head: Normocephalic.     Nose: Nose normal.     Mouth/Throat:     Mouth: Oropharynx is clear and moist.  Eyes:     Extraocular Movements: EOM normal.     Pupils: Pupils are equal, round, and reactive to light.  Neck:     Vascular: No carotid bruit or JVD.  Cardiovascular:     Rate and Rhythm: Normal rate and regular rhythm.     Pulses: Intact distal pulses.     Heart sounds: Normal heart sounds.  Pulmonary:     Effort: Pulmonary effort is normal. No respiratory  distress.     Breath sounds: Wheezing (exp throughout) present. No rales.     Comments: Diminished breath sounds on inspiration. Chest:     Chest wall: No tenderness.  Abdominal:     General: Bowel sounds are normal. There is no distension or abdominal bruit. Aorta is normal.     Palpations: Abdomen is soft. There is no hepatomegaly, splenomegaly, mass or pulsatile mass.     Tenderness: There is no abdominal tenderness.  Musculoskeletal:        General: No edema. Normal range of motion.     Cervical back: Normal range of motion and neck supple.  Lymphadenopathy:     Cervical: No cervical adenopathy.  Skin:    General: Skin is warm and dry.  Neurological:     Mental Status: She is alert and oriented to person, place, and time.     Deep Tendon Reflexes: Reflexes are normal and symmetric.  Psychiatric:        Mood and Affect: Mood and affect normal.        Behavior: Behavior normal.        Thought Content: Thought content normal.        Judgment: Judgment normal.    BP 134/72   Pulse 63   Temp 97.7 F (36.5 C) (Temporal)   Resp 20   Ht 5\' 2"  (1.575 m)   Wt 196 lb (88.9 kg)   SpO2 99%   BMI 35.85 kg/m         Assessment & Plan:  Hedwig A Monarrez comes in today with chief complaint of Medical Management of Chronic Issues   Diagnosis and orders addressed:  1. Mixed hyperlipidemia Low fat diet - simvastatin (ZOCOR) 40 MG tablet; TAKE 1 TABLET (40 MG TOTAL) BY MOUTH DAILY.  Dispense: 90 tablet; Refill: 1  2. Migraine without aura and with status migrainosus, not intractable stop caffeine consumption - SUMAtriptan (IMITREX) 100 MG tablet; Take 1 tablet (100 mg total) by mouth as needed. May repeat in 2 hours if headache persists or recurs.  Dispense: 9 tablet; Refill: 5  3. COPD with chronic bronchitis (HCC) Smoking cessation - tiotropium (SPIRIVA HANDIHALER) 18 MCG inhalation capsule; Place 1 capsule (18 mcg total) into inhaler and inhale daily.  Dispense: 30 capsule;  Refill: 5 - Fluticasone-Salmeterol (ADVAIR) 250-50 MCG/DOSE AEPB; Inhale 1 puff into the lungs every 12 (twelve) hours.  Dispense: 60 each; Refill: 5  4. Graves disease Labs pending - methimazole (TAPAZOLE) 5 MG tablet; TAKE 1/2 TABLET BY MOUTH DAILY  Dispense: 90 tablet; Refill: 1  5. Osteopenia of lumbar spine Weight bearing exercises Will call with dexascan results - DG [facility] DEXA  6. Recurrent major depressive disorder, in full remission ([place]) Stress management - escitalopram (LEXAPRO) 20 MG tablet; Take 1 tablet (20 mg total) by mouth daily.  Dispense: 90 tablet; Refill: 0  7. Primary insomnia Bedtime routine   8. Urinary urgency - tolterodine (DETROL LA) 4 MG 24 hr capsule; TAKE 1 CAPSULE BY MOUTH EVERY DAY  Dispense: 90 capsule; Refill: 1  9. Smoker Smoking cessation encouraged - CT CHEST LUNG CA SCREEN LOW DOSE W/O CM; Future  10. Vitamin D deficiency Continue daily vitamin d supplement  11. BMI 30.0-30.9,adult Discussed diet and exercise for person with BMI >25 Will recheck weight in 3-6 months   12. Essential hypertension Low sodium diet - hydrochlorothiazide (HYDRODIURIL) 25 MG tablet; Take 1 tablet (25 mg total) by mouth daily.  Dispense: 90 tablet; Refill: 1  13. Gastroesophageal reflux disease Avoid spicy foods Do not eat 2 hours prior to bedtime - omeprazole (PRILOSEC) 20 MG capsule; Take 1 capsule (20 mg total) by mouth 2 (two) times daily before a meal.  Dispense: 180 capsule; Refill: 1   Labs pending Health Maintenance reviewed Diet and exercise encouraged  Follow up plan: 6 months   [name], FNP

## 2020-05-23 NOTE — Patient Instructions (Signed)

## 2020-05-24 LAB — CBC WITH DIFFERENTIAL/PLATELET
Basophils Absolute: 0.1 10*3/uL (ref 0.0–0.2)
Basos: 1 %
EOS (ABSOLUTE): 0.6 10*3/uL — ABNORMAL HIGH (ref 0.0–0.4)
Eos: 8 %
Hematocrit: 39.1 % (ref 34.0–46.6)
Hemoglobin: 13.2 g/dL (ref 11.1–15.9)
Immature Grans (Abs): 0 10*3/uL (ref 0.0–0.1)
Immature Granulocytes: 0 %
Lymphocytes Absolute: 2.2 10*3/uL (ref 0.7–3.1)
Lymphs: 31 %
MCH: 32.6 pg (ref 26.6–33.0)
MCHC: 33.8 g/dL (ref 31.5–35.7)
MCV: 97 fL (ref 79–97)
Monocytes Absolute: 0.8 10*3/uL (ref 0.1–0.9)
Monocytes: 12 %
Neutrophils Absolute: 3.3 10*3/uL (ref 1.4–7.0)
Neutrophils: 48 %
Platelets: 332 10*3/uL (ref 150–450)
RBC: 4.05 x10E6/uL (ref 3.77–5.28)
RDW: 11.7 % (ref 11.7–15.4)
WBC: 6.9 10*3/uL (ref 3.4–10.8)

## 2020-05-24 LAB — CMP14+EGFR
ALT: 13 IU/L (ref 0–32)
AST: 18 IU/L (ref 0–40)
Albumin/Globulin Ratio: 1.5 (ref 1.2–2.2)
Albumin: 3.8 g/dL (ref 3.8–4.8)
Alkaline Phosphatase: 81 IU/L (ref 44–121)
BUN/Creatinine Ratio: 26 (ref 12–28)
BUN: 17 mg/dL (ref 8–27)
Bilirubin Total: 0.2 mg/dL (ref 0.0–1.2)
CO2: 25 mmol/L (ref 20–29)
Calcium: 9 mg/dL (ref 8.7–10.3)
Chloride: 95 mmol/L — ABNORMAL LOW (ref 96–106)
Creatinine, Ser: 0.65 mg/dL (ref 0.57–1.00)
GFR calc Af Amer: 105 mL/min/{1.73_m2} (ref >59)
GFR calc non Af Amer: 91 mL/min/{1.73_m2} (ref >59)
Globulin, Total: 2.5 g/dL (ref 1.5–4.5)
Glucose: 87 mg/dL (ref 65–99)
Potassium: 4.2 mmol/L (ref 3.5–5.2)
Sodium: 133 mmol/L — ABNORMAL LOW (ref 134–144)
Total Protein: 6.3 g/dL (ref 6.0–8.5)

## 2020-05-24 LAB — THYROID PANEL WITH TSH
Free Thyroxine Index: 2 (ref 1.2–4.9)
T3 Uptake Ratio: 26 % (ref 24–39)
T4, Total: 7.6 ug/dL (ref 4.5–12.0)
TSH: 1.46 u[IU]/mL (ref 0.450–4.500)

## 2020-05-24 LAB — LIPID PANEL
Chol/HDL Ratio: 2.9 ratio (ref 0.0–4.4)
Cholesterol, Total: 150 mg/dL (ref 100–199)
HDL: 51 mg/dL (ref >39)
LDL Chol Calc (NIH): 87 mg/dL (ref 0–99)
Triglycerides: 61 mg/dL (ref 0–149)
VLDL Cholesterol Cal: 12 mg/dL (ref 5–40)

## 2020-05-26 ENCOUNTER — Telehealth: Payer: Self-pay | Admitting: Nurse Practitioner

## 2020-06-12 ENCOUNTER — Other Ambulatory Visit (HOSPITAL_COMMUNITY): Payer: Self-pay

## 2020-06-12 DIAGNOSIS — Z122 Encounter for screening for malignant neoplasm of respiratory organs: Secondary | ICD-10-CM

## 2020-06-12 DIAGNOSIS — Z87891 Personal history of nicotine dependence: Secondary | ICD-10-CM

## 2020-06-12 NOTE — Progress Notes (Signed)
Referral received for LCS. Patient's SDMV in April of 2018. To be scheduled.

## 2020-06-14 ENCOUNTER — Encounter (HOSPITAL_COMMUNITY): Payer: Self-pay

## 2020-06-14 NOTE — Progress Notes (Signed)
Patient scheduled for LDCT on 07/14/2020 at 0900. Patient aware.

## 2020-07-03 ENCOUNTER — Other Ambulatory Visit: Payer: Self-pay | Admitting: Nurse Practitioner

## 2020-07-14 ENCOUNTER — Other Ambulatory Visit: Payer: Self-pay

## 2020-07-14 ENCOUNTER — Ambulatory Visit (HOSPITAL_COMMUNITY)
Admission: RE | Admit: 2020-07-14 | Discharge: 2020-07-14 | Disposition: A | Discharge status: Home / Self Care | Payer: Medicare Other | Source: Ambulatory Visit | Attending: Oncology | Admitting: Oncology

## 2020-07-14 DIAGNOSIS — Z122 Encounter for screening for malignant neoplasm of respiratory organs: Secondary | ICD-10-CM | POA: Diagnosis present

## 2020-07-14 DIAGNOSIS — Z87891 Personal history of nicotine dependence: Secondary | ICD-10-CM | POA: Diagnosis present

## 2020-07-17 ENCOUNTER — Encounter (HOSPITAL_COMMUNITY): Payer: Self-pay

## 2020-07-17 NOTE — Progress Notes (Signed)

## 2020-09-09 ENCOUNTER — Other Ambulatory Visit: Payer: Self-pay | Admitting: Nurse Practitioner

## 2020-09-09 DIAGNOSIS — F3342 Major depressive disorder, recurrent, in full remission: Secondary | ICD-10-CM

## 2020-09-29 ENCOUNTER — Other Ambulatory Visit: Payer: Self-pay | Admitting: Nurse Practitioner

## 2020-11-13 ENCOUNTER — Other Ambulatory Visit: Payer: Self-pay | Admitting: Nurse Practitioner

## 2020-11-13 DIAGNOSIS — E782 Mixed hyperlipidemia: Secondary | ICD-10-CM

## 2020-11-23 ENCOUNTER — Other Ambulatory Visit: Payer: Self-pay

## 2020-11-23 ENCOUNTER — Encounter: Payer: Self-pay | Admitting: Nurse Practitioner

## 2020-11-23 ENCOUNTER — Ambulatory Visit: Payer: Medicare Other | Admitting: Nurse Practitioner

## 2020-11-23 ENCOUNTER — Ambulatory Visit: Payer: Self-pay | Admitting: Nurse Practitioner

## 2020-11-23 VITALS — BP 126/64 | HR 69 | Temp 97.2°F | Resp 20 | Ht 62.0 in | Wt 181.0 lb

## 2020-11-23 DIAGNOSIS — M8588 Other specified disorders of bone density and structure, other site: Secondary | ICD-10-CM | POA: Diagnosis not present

## 2020-11-23 DIAGNOSIS — E0500 Thyrotoxicosis with diffuse goiter without thyrotoxic crisis or storm: Secondary | ICD-10-CM | POA: Diagnosis not present

## 2020-11-23 DIAGNOSIS — F5101 Primary insomnia: Secondary | ICD-10-CM

## 2020-11-23 DIAGNOSIS — R3915 Urgency of urination: Secondary | ICD-10-CM

## 2020-11-23 DIAGNOSIS — E559 Vitamin D deficiency, unspecified: Secondary | ICD-10-CM

## 2020-11-23 DIAGNOSIS — J449 Chronic obstructive pulmonary disease, unspecified: Secondary | ICD-10-CM | POA: Diagnosis not present

## 2020-11-23 DIAGNOSIS — F3342 Major depressive disorder, recurrent, in full remission: Secondary | ICD-10-CM

## 2020-11-23 DIAGNOSIS — E782 Mixed hyperlipidemia: Secondary | ICD-10-CM

## 2020-11-23 DIAGNOSIS — K219 Gastro-esophageal reflux disease without esophagitis: Secondary | ICD-10-CM | POA: Insufficient documentation

## 2020-11-23 DIAGNOSIS — Z6830 Body mass index (BMI) 30.0-30.9, adult: Secondary | ICD-10-CM

## 2020-11-23 DIAGNOSIS — F17200 Nicotine dependence, unspecified, uncomplicated: Secondary | ICD-10-CM

## 2020-11-23 DIAGNOSIS — I10 Essential (primary) hypertension: Secondary | ICD-10-CM | POA: Insufficient documentation

## 2020-11-23 MED ORDER — TOLTERODINE TARTRATE ER 4 MG PO CP24: ORAL_CAPSULE | ORAL | 1 refills | Status: DC

## 2020-11-23 MED ORDER — ZOLPIDEM TARTRATE 10 MG PO TABS: 10.0000 mg | ORAL_TABLET | Freq: Every evening | ORAL | 1 refills | Status: DC | PRN

## 2020-11-23 MED ORDER — SIMVASTATIN 40 MG PO TABS: ORAL_TABLET | ORAL | 1 refills | Status: DC

## 2020-11-23 MED ORDER — HYDROCHLOROTHIAZIDE 25 MG PO TABS: 25.0000 mg | ORAL_TABLET | Freq: Every day | ORAL | 1 refills | Status: DC

## 2020-11-23 MED ORDER — ESCITALOPRAM OXALATE 20 MG PO TABS: 20.0000 mg | ORAL_TABLET | Freq: Every day | ORAL | 1 refills | Status: DC

## 2020-11-23 MED ORDER — OMEPRAZOLE 20 MG PO CPDR: 20.0000 mg | DELAYED_RELEASE_CAPSULE | Freq: Two times a day (BID) | ORAL | 1 refills | Status: DC

## 2020-11-23 MED ORDER — METHIMAZOLE 5 MG PO TABS: ORAL_TABLET | ORAL | 1 refills | Status: DC

## 2020-11-23 NOTE — Progress Notes (Signed)
Subjective:    Patient ID: Barbara Boyd, female    DOB: 05/29/1950, 70 y.o.   MRN: [id number]   Chief Complaint: Medical Management of Chronic Issues    HPI:  1. COPD with chronic bronchitis ([place]) Stays SOB with to much activity. She is on spirivia and advair. She does not use daily because they are expensive.  2. Graves disease Is on tapazole daily and is doing well. No c/o heart racing. No bulging eyes. Lab Results  Component Value Date   TSH 1.460 05/23/2020    3. Osteopenia of lumbar spine Last dexascan was done 05/23/20. Her t score was -2.3.  4. Recurrent major depressive disorder, in full remission ([place]) Is on lexapro and says she is doing well. Depression screen [facility] 2/9 11/23/2020 05/23/2020 03/27/2020  Decreased Interest 0 0 1  Down, Depressed, Hopeless 0 0 1  PHQ - 2 Score 0 0 2  Altered sleeping 1 - 1  Tired, decreased energy 1 - -  Change in appetite 0 - 3  Feeling bad or failure about yourself  0 - 0  Trouble concentrating 0 - 0  Moving slowly or fidgety/restless 0 - 0  Suicidal thoughts 0 - 0  PHQ-9 Score 2 - 6  Difficult doing work/chores Not difficult at all - Somewhat difficult  Some recent data might be hidden     5. Primary insomnia Is on ambien to sleep. Has been out for several weeks. She has been using melatonin most of the time. Doe snot want to take ambien every night. Sleeps about 6-7 hours but wkaes up 2-3 times a night.  6. Smoker Smokes about 2 packs a day- ow dose CT scan was done in January 2022 and was good.  7. Vitamin D deficiency As not been taking vitamin d supplement everyday.  8. BMI 30.0-30.9,adult Weight is down 15lbs since last visit Wt Readings from Last 3 Encounters:  11/23/20 181 lb (82.1 kg)  05/23/20 196 lb (88.9 kg)  01/13/20 189 lb (85.7 kg)   BMI Readings from Last 3 Encounters:  11/23/20 33.11 kg/m  05/23/20 35.85 kg/m  01/13/20 34.57 kg/m       Outpatient Encounter Medications as of 11/23/2020   Medication Sig   Calcipotriene-Betameth Diprop (ENSTILAR EX) Apply topically. Sample from MD   escitalopram (LEXAPRO) 20 MG tablet TAKE 1 TABLET BY MOUTH EVERY DAY   hydrochlorothiazide (HYDRODIURIL) 25 MG tablet Take 1 tablet (25 mg total) by mouth daily.   MELATONIN PO Take by mouth.   meloxicam (MOBIC) 15 MG tablet TAKE 1 TABLET (15 MG TOTAL) BY MOUTH DAILY.   methimazole (TAPAZOLE) 5 MG tablet TAKE 1/2 TABLET BY MOUTH DAILY   omeprazole (PRILOSEC) 20 MG capsule Take 1 capsule (20 mg total) by mouth 2 (two) times daily before a meal.   simvastatin (ZOCOR) 40 MG tablet TAKE 1 TABLET BY MOUTH EVERY DAY   SUMAtriptan (IMITREX) 100 MG tablet Take 1 tablet (100 mg total) by mouth as needed. May repeat in 2 hours if headache persists or recurs.   tolterodine (DETROL LA) 4 MG 24 hr capsule TAKE 1 CAPSULE BY MOUTH EVERY DAY   triamcinolone ointment (KENALOG) 0.1 % APPLY TWICE A DAY FOR 2 WEEKS   Fluticasone-Salmeterol (ADVAIR) 250-50 MCG/DOSE AEPB Inhale 1 puff into the lungs every 12 (twelve) hours. (Patient not taking: Reported on 11/23/2020)   tiotropium (SPIRIVA HANDIHALER) 18 MCG inhalation capsule Place 1 capsule (18 mcg total) into inhaler and inhale daily. (Patient not  taking: Reported on 11/23/2020)   zolpidem (AMBIEN) 10 MG tablet Take 1 tablet (10 mg total) by mouth at bedtime as needed for sleep. (Patient not taking: Reported on 12/30/2019)   No facility-administered encounter medications on file as of 11/23/2020.    Past Surgical History:  Procedure Laterality Date   BILATERAL TOTAL MASTECTOMY WITH AXILLARY LYMPH NODE DISSECTION Bilateral    BREAST RECONSTRUCTION     COLONOSCOPY     POLYPECTOMY      Family History  Problem Relation Age of Onset   Lung cancer Father    Arthritis Mother    Osteoporosis Mother    Dementia Mother    Breast cancer Maternal Aunt    Breast cancer Paternal Aunt    Breast cancer Maternal Aunt    Osteoporosis Maternal Grandmother    Breast cancer  Maternal Grandmother    Colon cancer Neg Hx    Rectal cancer Neg Hx    Stomach cancer Neg Hx    Colon polyps Neg Hx    Heart disease Neg Hx     New complaints: None today  Social history: Lives with her husband  Controlled substance contract: 11/23/20     Review of Systems  Constitutional:  Negative for diaphoresis.  Eyes:  Negative for pain.  Respiratory:  Negative for shortness of breath.   Cardiovascular:  Negative for chest pain, palpitations and leg swelling.  Gastrointestinal:  Negative for abdominal pain.  Endocrine: Negative for polydipsia.  Skin:  Negative for rash.  Neurological:  Negative for dizziness, weakness and headaches.  Hematological:  Does not bruise/bleed easily.  All other systems reviewed and are negative.     Objective:   Physical Exam Vitals and nursing note reviewed.  Constitutional:      General: She is not in acute distress.    Appearance: Normal appearance. She is well-developed.  HENT:     Head: Normocephalic.     Right Ear: Tympanic membrane normal.     Left Ear: Tympanic membrane normal.     Nose: Nose normal.     Mouth/Throat:     Mouth: Mucous membranes are moist.  Eyes:     Pupils: Pupils are equal, round, and reactive to light.  Neck:     Vascular: No carotid bruit or JVD.  Cardiovascular:     Rate and Rhythm: Normal rate and regular rhythm.     Heart sounds: Normal heart sounds.  Pulmonary:     Effort: Pulmonary effort is normal. No respiratory distress.     Breath sounds: Normal breath sounds. No wheezing or rales.  Chest:     Chest wall: No tenderness.  Abdominal:     General: Bowel sounds are normal. There is no distension or abdominal bruit.     Palpations: Abdomen is soft. There is no hepatomegaly, splenomegaly, mass or pulsatile mass.     Tenderness: There is no abdominal tenderness.  Musculoskeletal:        General: Normal range of motion.     Cervical back: Normal range of motion and neck supple.   Lymphadenopathy:     Cervical: No cervical adenopathy.  Skin:    General: Skin is warm and dry.  Neurological:     Mental Status: She is alert and oriented to person, place, and time.     Deep Tendon Reflexes: Reflexes are normal and symmetric.  Psychiatric:        Behavior: Behavior normal.        Thought Content: Thought content  normal.        Judgment: Judgment normal.     BP 126/64   Pulse 69   Temp (!) 97.2 F (36.2 C) (Temporal)   Resp 20   Ht _0  (1.575 m)   Wt 181 lb (82.1 kg)   SpO2 100%   BMI 33.11 kg/m       Assessment & Plan:  Keionna A Carriger comes in today with chief complaint of Medical Management of Chronic Issues   Diagnosis and orders addressed:  1. COPD with chronic bronchitis ([place]) Encouraged to use spirirva and advair daily  2. Graves disease Labs pending - methimazole (TAPAZOLE) 5 MG tablet; TAKE 1/2 TABLET BY MOUTH DAILY  Dispense: 90 tablet; Refill: 1 - Thyroid Panel With TSH  3. Osteopenia of lumbar spine Weight  bearing exercises encouraged  4. Recurrent major depressive disorder, in full remission ([place]) Stress management - escitalopram (LEXAPRO) 20 MG tablet; Take 1 tablet (20 mg total) by mouth daily.  Dispense: 90 tablet; Refill: 1  5. Primary insomnia Bedtime routine - zolpidem (AMBIEN) 10 MG tablet; Take 1 tablet (10 mg total) by mouth at bedtime as needed for sleep.  Dispense: 15 tablet; Refill: 1  6. Smoker Smoking cessation encouraged  7. Vitamin D deficiency Encouraged daily vitamin supplement  8. BMI 30.0-30.9,adult Discussed diet and exercise for person with BMI >25 Will recheck weight in 3-6 months  9. Essential hypertension Low sodium diet - hydrochlorothiazide (HYDRODIURIL) 25 MG tablet; Take 1 tablet (25 mg total) by mouth daily.  Dispense: 90 tablet; Refill: 1 - CBC with Differential/Platelet - CMP14+EGFR  10. Gastroesophageal reflux disease Avoid spicy foods Do not eat 2 hours prior to bedtime  -  omeprazole (PRILOSEC) 20 MG capsule; Take 1 capsule (20 mg total) by mouth 2 (two) times daily before a meal.  Dispense: 180 capsule; Refill: 1  11. Mixed hyperlipidemia Low fat diet  - simvastatin (ZOCOR) 40 MG tablet; TAKE 1 TABLET BY MOUTH EVERY DAY  Dispense: 90 tablet; Refill: 1 - Lipid panel  12. Urinary urgency - tolterodine (DETROL LA) 4 MG 24 hr capsule; TAKE 1 CAPSULE BY MOUTH EVERY DAY  Dispense: 90 capsule; Refill: 1   Labs pending Health Maintenance reviewed Diet and exercise encouraged  Follow up plan: 6 months   [name], FNP

## 2020-11-23 NOTE — Patient Instructions (Signed)
https://www.nhlbi.nih.gov/files/docs/public/heart/dash_brief.pdf">  DASH Eating Plan DASH stands for Dietary Approaches to Stop Hypertension. The DASH eating plan is a healthy eating plan that has been shown to: Reduce high blood pressure (hypertension). Reduce your risk for type 2 diabetes, heart disease, and stroke. Help with weight loss. What are tips for following this plan? Reading food labels Check food labels for the amount of salt (sodium) per serving. Choose foods with less than 5 percent of the Daily Value of sodium. Generally, foods with less than 300 milligrams (mg) of sodium per serving fit into this eating plan. To find whole grains, look for the word "whole" as the first word in the ingredient list. Shopping Buy products labeled as "low-sodium" or "no salt added." Buy fresh foods. Avoid canned foods and pre-made or frozen meals. Cooking Avoid adding salt when cooking. Use salt-free seasonings or herbs instead of table salt or sea salt. Check with your health care provider or pharmacist before using salt substitutes. Do not fry foods. Cook foods using healthy methods such as baking, boiling, grilling, roasting, and broiling instead. Cook with heart-healthy oils, such as olive, canola, avocado, soybean, or sunflower oil. Meal planning  Eat a balanced diet that includes: 4 or more servings of fruits and 4 or more servings of vegetables each day. Try to fill one-half of your plate with fruits and vegetables. 6-8 servings of whole grains each day. Less than 6 oz (170 g) of lean meat, poultry, or fish each day. A 3-oz (85-g) serving of meat is about the same size as a deck of cards. One egg equals 1 oz (28 g). 2-3 servings of low-fat dairy each day. One serving is 1 cup (237 mL). 1 serving of nuts, seeds, or beans 5 times each week. 2-3 servings of heart-healthy fats. Healthy fats called omega-3 fatty acids are found in foods such as walnuts, flaxseeds, fortified milks, and eggs.  These fats are also found in cold-water fish, such as sardines, salmon, and mackerel. Limit how much you eat of: Canned or prepackaged foods. Food that is high in trans fat, such as some fried foods. Food that is high in saturated fat, such as fatty meat. Desserts and other sweets, sugary drinks, and other foods with added sugar. Full-fat dairy products. Do not salt foods before eating. Do not eat more than 4 egg yolks a week. Try to eat at least 2 vegetarian meals a week. Eat more home-cooked food and less restaurant, buffet, and fast food.  Lifestyle When eating at a restaurant, ask that your food be prepared with less salt or no salt, if possible. If you drink alcohol: Limit how much you use to: 0-1 drink a day for women who are not pregnant. 0-2 drinks a day for men. Be aware of how much alcohol is in your drink. In the [place] one drink equals one 12 oz bottle of beer (355 mL), one 5 oz glass of wine (148 mL), or one 1 oz glass of hard liquor (44 mL). General information Avoid eating more than 2,300 mg of salt a day. If you have hypertension, you may need to reduce your sodium intake to 1,500 mg a day. Work with your health care provider to maintain a healthy body weight or to lose weight. Ask what an ideal weight is for you. Get at least 30 minutes of exercise that causes your heart to beat faster (aerobic exercise) most days of the week. Activities may include walking, swimming, or biking. Work with your health care provider   or dietitian to adjust your eating plan to your individual calorie needs. What foods should I eat? Fruits All fresh, dried, or frozen fruit. Canned fruit in natural juice (without addedsugar). Vegetables Fresh or frozen vegetables (raw, steamed, roasted, or grilled). Low-sodium or reduced-sodium tomato and vegetable juice. Low-sodium or reduced-sodium tomatosauce and tomato paste. Low-sodium or reduced-sodium canned vegetables. Grains Whole-grain or  whole-wheat bread. Whole-grain or whole-wheat pasta. Brown rice. Oatmeal. Quinoa. Bulgur. Whole-grain and low-sodium cereals. Pita bread.Low-fat, low-sodium crackers. Whole-wheat flour tortillas. Meats and other proteins Skinless chicken or turkey. Ground chicken or turkey. Pork with fat trimmed off. Fish and seafood. Egg whites. Dried beans, peas, or lentils. Unsalted nuts, nut butters, and seeds. Unsalted canned beans. Lean cuts of beef with fat trimmed off. Low-sodium, lean precooked or cured meat, such as sausages or meatloaves. Dairy Low-fat (1%) or fat-free (skim) milk. Reduced-fat, low-fat, or fat-free cheeses. Nonfat, low-sodium ricotta or cottage cheese. Low-fat or nonfatyogurt. Low-fat, low-sodium cheese. Fats and oils Soft margarine without trans fats. Vegetable oil. Reduced-fat, low-fat, or light mayonnaise and salad dressings (reduced-sodium). Canola, safflower, olive, avocado, soybean, andsunflower oils. Avocado. Seasonings and condiments Herbs. Spices. Seasoning mixes without salt. Other foods Unsalted popcorn and pretzels. Fat-free sweets. The items listed above may not be a complete list of foods and beverages you can eat. Contact a dietitian for more information. What foods should I avoid? Fruits Canned fruit in a light or heavy syrup. Fried fruit. Fruit in cream or buttersauce. Vegetables Creamed or fried vegetables. Vegetables in a cheese sauce. Regular canned vegetables (not low-sodium or reduced-sodium). Regular canned tomato sauce and paste (not low-sodium or reduced-sodium). Regular tomato and vegetable juice(not low-sodium or reduced-sodium). Pickles. Olives. Grains Baked goods made with fat, such as croissants, muffins, or some breads. Drypasta or rice meal packs. Meats and other proteins Fatty cuts of meat. Ribs. Fried meat. Bacon. Bologna, salami, and other precooked or cured meats, such as sausages or meat loaves. Fat from the back of a pig (fatback). Bratwurst.  Salted nuts and seeds. Canned beans with added salt. Canned orsmoked fish. Whole eggs or egg yolks. Chicken or turkey with skin. Dairy Whole or 2% milk, cream, and half-and-half. Whole or full-fat cream cheese. Whole-fat or sweetened yogurt. Full-fat cheese. Nondairy creamers. Whippedtoppings. Processed cheese and cheese spreads. Fats and oils Butter. Stick margarine. Lard. Shortening. Ghee. Bacon fat. Tropical oils, suchas coconut, palm kernel, or palm oil. Seasonings and condiments Onion salt, garlic salt, seasoned salt, table salt, and sea salt. Worcestershire sauce. Tartar sauce. Barbecue sauce. Teriyaki sauce. Soy sauce, including reduced-sodium. Steak sauce. Canned and packaged gravies. Fish sauce. Oyster sauce. Cocktail sauce. Store-bought horseradish. Ketchup. Mustard. Meat flavorings and tenderizers. Bouillon cubes. Hot sauces. Pre-made or packaged marinades. Pre-made or packaged taco seasonings. Relishes. Regular saladdressings. Other foods Salted popcorn and pretzels. The items listed above may not be a complete list of foods and beverages you should avoid. Contact a dietitian for more information. Where to find more information National Heart, Lung, and Blood Institute: www.nhlbi.nih.gov American Heart Association: www.heart.org Academy of Nutrition and Dietetics: www.eatright.org National Kidney Foundation: www.kidney.org Summary The DASH eating plan is a healthy eating plan that has been shown to reduce high blood pressure (hypertension). It may also reduce your risk for type 2 diabetes, heart disease, and stroke. When on the DASH eating plan, aim to eat more fresh fruits and vegetables, whole grains, lean proteins, low-fat dairy, and heart-healthy fats. With the DASH eating plan, you should limit salt (sodium) intake to 2,300   mg a day. If you have hypertension, you may need to reduce your sodium intake to 1,500 mg a day. Work with your health care provider or dietitian to adjust  your eating plan to your individual calorie needs. This information is not intended to replace advice given to you by your health care provider. Make sure you discuss any questions you have with your healthcare provider. Document Revised: 04/09/2019 Document Reviewed: 04/09/2019 Elsevier Patient Education  2022 Elsevier Inc.  

## 2020-11-24 LAB — LIPID PANEL
Chol/HDL Ratio: 2.6 ratio (ref 0.0–4.4)
Cholesterol, Total: 144 mg/dL (ref 100–199)
HDL: 56 mg/dL (ref >39)
LDL Chol Calc (NIH): 76 mg/dL (ref 0–99)
Triglycerides: 54 mg/dL (ref 0–149)
VLDL Cholesterol Cal: 12 mg/dL (ref 5–40)

## 2020-11-24 LAB — CMP14+EGFR
ALT: 12 IU/L (ref 0–32)
AST: 18 IU/L (ref 0–40)
Albumin/Globulin Ratio: 2 (ref 1.2–2.2)
Albumin: 4.3 g/dL (ref 3.8–4.8)
Alkaline Phosphatase: 90 IU/L (ref 44–121)
BUN/Creatinine Ratio: 25 (ref 12–28)
BUN: 17 mg/dL (ref 8–27)
Bilirubin Total: 0.3 mg/dL (ref 0.0–1.2)
CO2: 24 mmol/L (ref 20–29)
Calcium: 9.4 mg/dL (ref 8.7–10.3)
Chloride: 91 mmol/L — ABNORMAL LOW (ref 96–106)
Creatinine, Ser: 0.68 mg/dL (ref 0.57–1.00)
Globulin, Total: 2.2 g/dL (ref 1.5–4.5)
Glucose: 97 mg/dL (ref 65–99)
Potassium: 4.3 mmol/L (ref 3.5–5.2)
Sodium: 131 mmol/L — ABNORMAL LOW (ref 134–144)
Total Protein: 6.5 g/dL (ref 6.0–8.5)
eGFR: 94 mL/min/{1.73_m2} (ref >59)

## 2020-11-24 LAB — CBC WITH DIFFERENTIAL/PLATELET
Basophils Absolute: 0.1 10*3/uL (ref 0.0–0.2)
Basos: 1 %
EOS (ABSOLUTE): 0.3 10*3/uL (ref 0.0–0.4)
Eos: 5 %
Hematocrit: 40.3 % (ref 34.0–46.6)
Hemoglobin: 13.9 g/dL (ref 11.1–15.9)
Immature Grans (Abs): 0 10*3/uL (ref 0.0–0.1)
Immature Granulocytes: 0 %
Lymphocytes Absolute: 1.9 10*3/uL (ref 0.7–3.1)
Lymphs: 29 %
MCH: 33.3 pg — ABNORMAL HIGH (ref 26.6–33.0)
MCHC: 34.5 g/dL (ref 31.5–35.7)
MCV: 96 fL (ref 79–97)
Monocytes Absolute: 0.8 10*3/uL (ref 0.1–0.9)
Monocytes: 12 %
Neutrophils Absolute: 3.5 10*3/uL (ref 1.4–7.0)
Neutrophils: 53 %
Platelets: 313 10*3/uL (ref 150–450)
RBC: 4.18 x10E6/uL (ref 3.77–5.28)
RDW: 12.4 % (ref 11.7–15.4)
WBC: 6.6 10*3/uL (ref 3.4–10.8)

## 2020-11-24 LAB — THYROID PANEL WITH TSH
Free Thyroxine Index: 2.2 (ref 1.2–4.9)
T3 Uptake Ratio: 26 % (ref 24–39)
T4, Total: 8.4 ug/dL (ref 4.5–12.0)
TSH: 2.18 u[IU]/mL (ref 0.450–4.500)

## 2020-12-01 ENCOUNTER — Encounter: Payer: Self-pay | Admitting: Family Medicine

## 2020-12-01 ENCOUNTER — Ambulatory Visit: Payer: Medicare Other | Admitting: Family Medicine

## 2020-12-01 DIAGNOSIS — S30860A Insect bite (nonvenomous) of lower back and pelvis, initial encounter: Secondary | ICD-10-CM | POA: Diagnosis not present

## 2020-12-01 DIAGNOSIS — W57XXXA Bitten or stung by nonvenomous insect and other nonvenomous arthropods, initial encounter: Secondary | ICD-10-CM | POA: Diagnosis not present

## 2020-12-01 MED ORDER — DOXYCYCLINE HYCLATE 100 MG PO TABS: 200.0000 mg | ORAL_TABLET | Freq: Once | ORAL | 0 refills | Status: AC

## 2020-12-01 NOTE — Progress Notes (Signed)
   Virtual Visit  Note Due to COVID-19 pandemic this visit was conducted virtually. This visit type was conducted due to national recommendations for restrictions regarding the COVID-19 Pandemic (e.g. social distancing, sheltering in place) in an effort to limit this patient's exposure and mitigate transmission in our community. All issues noted in this document were discussed and addressed.  A physical exam was not performed with this format.  I connected with Barbara Boyd on 12/01/20 at 1140 by telephone and verified that I am speaking with the correct person using two identifiers. Barbara Boyd is currently located at home and and her family is currently with her the during visit. The provider, Gwenlyn Perking, FNP is located in their office at time of visit.  I discussed the limitations, risks, security and privacy concerns of performing an evaluation and management service by telephone and the availability of in person appointments. I also discussed with the patient that there may be a patient responsible charge related to this service. The patient expressed understanding and agreed to proceed.  CC: tick bite  History and Present Illness:  HPI Barbara Boyd reports an itchy area in her groin for a few days. She did not think much of this because she has been dealing with eczema lately. Last night she used a flashlight to look at the area and noticed that it was a tick. She removed the tick. She is unsure of how long it had been attached for but it was likely at least a few days. There is erythema and bruising to the area. No central clearing or drainage. Denies fever, stiff neck, weakness, myalgias, weakness, nausea, or vomiting.     ROS As per HPI.  Observations/Objective: Alert and oriented x 3. Able to speak in full sentences without difficulty.    Assessment and Plan: Barbara Boyd was seen today for tick removal.  Diagnoses and all orders for this visit:  Tick bite of pelvic region,  initial encounter Prophylaxis as below, as tick was likely attached for a few days. No symptoms currently.  -     doxycycline (VIBRA-TABS) 100 MG tablet; Take 2 tablets (200 mg total) by mouth once for 1 dose. 1 po bid    Follow Up Instructions: Return to office for new or worsening symptoms, or if symptoms persist.     I discussed the assessment and treatment plan with the patient. The patient was provided an opportunity to ask questions and all were answered. The patient agreed with the plan and demonstrated an understanding of the instructions.   The patient was advised to call back or seek an in-person evaluation if the symptoms worsen or if the condition fails to improve as anticipated.  The above assessment and management plan was discussed with the patient. The patient verbalized understanding of and has agreed to the management plan. Patient is aware to call the clinic if symptoms persist or worsen. Patient is aware when to return to the clinic for a follow-up visit. Patient educated on when it is appropriate to go to the emergency department.   Time call ended:  1142  I provided 12 minutes of  non face-to-face time during this encounter.    Gwenlyn Perking, FNP

## 2020-12-25 ENCOUNTER — Other Ambulatory Visit: Payer: Self-pay | Admitting: Nurse Practitioner

## 2021-03-23 ENCOUNTER — Other Ambulatory Visit: Payer: Self-pay | Admitting: Nurse Practitioner

## 2021-04-09 ENCOUNTER — Telehealth: Payer: Self-pay | Admitting: Nurse Practitioner

## 2021-04-09 NOTE — Telephone Encounter (Signed)
  Left message for patient to call back and schedule Medicare Annual Wellness Visit (AWV) to be completed by video or phone.  No hx of AWV eligible for AWVI as of 08/18/2016  Please schedule at anytime with [place] --- Karle Starch  45 Minutes appointment   Any questions, please call me at [phone]

## 2021-04-16 ENCOUNTER — Other Ambulatory Visit: Payer: Self-pay | Admitting: Nurse Practitioner

## 2021-05-08 ENCOUNTER — Telehealth: Payer: Self-pay | Admitting: Nurse Practitioner

## 2021-05-08 NOTE — Telephone Encounter (Signed)
°  Left message for patient to call back and schedule Medicare Annual Wellness Visit (AWV) to be completed by video or phone.  No hx of AWV eligible for AWVI as of  08/18/2016 per [employer]  Please schedule at anytime with [place] --- Karle Starch  45 Minutes appointment   Any questions, please call me at [phone]

## 2021-05-16 ENCOUNTER — Ambulatory Visit (INDEPENDENT_AMBULATORY_CARE_PROVIDER_SITE_OTHER): Payer: Medicare Other

## 2021-05-16 VITALS — Ht 62.0 in | Wt 181.0 lb

## 2021-05-16 DIAGNOSIS — Z0000 Encounter for general adult medical examination without abnormal findings: Secondary | ICD-10-CM | POA: Diagnosis not present

## 2021-05-16 NOTE — Patient Instructions (Addendum)
Barbara Boyd , Thank you for taking time to come for your Medicare Wellness Visit. I appreciate your ongoing commitment to your health goals. Please review the following plan we discussed and let me know if I can assist you in the future.   Screening recommendations/referrals: Colonoscopy: Done 01/13/2020 Repeat in 5 years  Mammogram: Done 09/02/2013. No longer required. Bone Density: Done 05/23/2020. Repeat every 2 years  Recommended yearly ophthalmology/optometry visit for glaucoma screening and checkup Recommended yearly dental visit for hygiene and checkup  Vaccinations: Influenza vaccine: Done 05/07/2021. Repeat annually  Pneumococcal vaccine: Done 07/24/2015 and 08/26/2016. Tdap vaccine: Done 04/27/2019. Repeat in 10 years  Shingles vaccine: Done 07/24/2015 and 05/07/2021.   Covid-19:Done 07/22/2019, 08/27/2019 and 05/17/2020.  Advanced directives: Advance directive discussed with you today. Even though you declined this today, please call our office should you change your mind, and we can give you the proper paperwork for you to fill out.   Conditions/risks identified: Aim for 30 minutes of exercise or walking each day, drink 6-8 glasses of water and eat lots of fruits and vegetables.   Next appointment: Follow up in one year for your annual wellness visit 05/22/2022 at 10:30 am.   Preventive Care 65 Years and Older, Female Preventive care refers to lifestyle choices and visits with your health care provider that can promote health and wellness. What does preventive care include? A yearly physical exam. This is also called an annual well check. Dental exams once or twice a year. Routine eye exams. Ask your health care provider how often you should have your eyes checked. Personal lifestyle choices, including: Daily care of your teeth and gums. Regular physical activity. Eating a healthy diet. Avoiding tobacco and drug use. Limiting alcohol use. Practicing safe sex. Taking low-dose  aspirin every day. Taking vitamin and mineral supplements as recommended by your health care provider. What happens during an annual well check? The services and screenings done by your health care provider during your annual well check will depend on your age, overall health, lifestyle risk factors, and family history of disease. Counseling  Your health care provider may ask you questions about your: Alcohol use. Tobacco use. Drug use. Emotional well-being. Home and relationship well-being. Sexual activity. Eating habits. History of falls. Memory and ability to understand (cognition). Work and work Statistician. Reproductive health. Screening  You may have the following tests or measurements: Height, weight, and BMI. Blood pressure. Lipid and cholesterol levels. These may be checked every 5 years, or more frequently if you are over 55 years old. Skin check. Lung cancer screening. You may have this screening every year starting at age 27 if you have a 30-pack-year history of smoking and currently smoke or have quit within the past 15 years. Fecal occult blood test (FOBT) of the stool. You may have this test every year starting at age 71. Flexible sigmoidoscopy or colonoscopy. You may have a sigmoidoscopy every 5 years or a colonoscopy every 10 years starting at age 70. Hepatitis C blood test. Hepatitis B blood test. Sexually transmitted disease (STD) testing. Diabetes screening. This is done by checking your blood sugar (glucose) after you have not eaten for a while (fasting). You may have this done every 1-3 years. Bone density scan. This is done to screen for osteoporosis. You may have this done starting at age 33. Mammogram. This may be done every 1-2 years. Talk to your health care provider about how often you should have regular mammograms. Talk with your health care  provider about your test results, treatment options, and if necessary, the need for more tests. Vaccines  Your  health care provider may recommend certain vaccines, such as: Influenza vaccine. This is recommended every year. Tetanus, diphtheria, and acellular pertussis (Tdap, Td) vaccine. You may need a Td booster every 10 years. Zoster vaccine. You may need this after age 72. Pneumococcal 13-valent conjugate (PCV13) vaccine. One dose is recommended after age 68. Pneumococcal polysaccharide (PPSV23) vaccine. One dose is recommended after age 43. Talk to your health care provider about which screenings and vaccines you need and how often you need them. This information is not intended to replace advice given to you by your health care provider. Make sure you discuss any questions you have with your health care provider. Document Released: 06/02/2015 Document Revised: 01/24/2016 Document Reviewed: 03/07/2015 Elsevier Interactive Patient Education  2017 [place] Prevention in the Home Falls can cause injuries. They can happen to people of all ages. There are many things you can do to make your home safe and to help prevent falls. What can I do on the outside of my home? Regularly fix the edges of walkways and driveways and fix any cracks. Remove anything that might make you trip as you walk through a door, such as a raised step or threshold. Trim any bushes or trees on the path to your home. Use bright outdoor lighting. Clear any walking paths of anything that might make someone trip, such as rocks or tools. Regularly check to see if handrails are loose or broken. Make sure that both sides of any steps have handrails. Any raised decks and porches should have guardrails on the edges. Have any leaves, snow, or ice cleared regularly. Use sand or salt on walking paths during winter. Clean up any spills in your garage right away. This includes oil or grease spills. What can I do in the bathroom? Use night lights. Install grab bars by the toilet and in the tub and shower. Do not use towel bars as  grab bars. Use non-skid mats or decals in the tub or shower. If you need to sit down in the shower, use a plastic, non-slip stool. Keep the floor dry. Clean up any water that spills on the floor as soon as it happens. Remove soap buildup in the tub or shower regularly. Attach bath mats securely with double-sided non-slip rug tape. Do not have throw rugs and other things on the floor that can make you trip. What can I do in the bedroom? Use night lights. Make sure that you have a light by your bed that is easy to reach. Do not use any sheets or blankets that are too big for your bed. They should not hang down onto the floor. Have a firm chair that has side arms. You can use this for support while you get dressed. Do not have throw rugs and other things on the floor that can make you trip. What can I do in the kitchen? Clean up any spills right away. Avoid walking on wet floors. Keep items that you use a lot in easy-to-reach places. If you need to reach something above you, use a strong step stool that has a grab bar. Keep electrical cords out of the way. Do not use floor polish or wax that makes floors slippery. If you must use wax, use non-skid floor wax. Do not have throw rugs and other things on the floor that can make you trip. What can I  do with my stairs? Do not leave any items on the stairs. Make sure that there are handrails on both sides of the stairs and use them. Fix handrails that are broken or loose. Make sure that handrails are as long as the stairways. Check any carpeting to make sure that it is firmly attached to the stairs. Fix any carpet that is loose or worn. Avoid having throw rugs at the top or bottom of the stairs. If you do have throw rugs, attach them to the floor with carpet tape. Make sure that you have a light switch at the top of the stairs and the bottom of the stairs. If you do not have them, ask someone to add them for you. What else can I do to help prevent  falls? Wear shoes that: Do not have high heels. Have rubber bottoms. Are comfortable and fit you well. Are closed at the toe. Do not wear sandals. If you use a stepladder: Make sure that it is fully opened. Do not climb a closed stepladder. Make sure that both sides of the stepladder are locked into place. Ask someone to hold it for you, if possible. Clearly mark and make sure that you can see: Any grab bars or handrails. First and last steps. Where the edge of each step is. Use tools that help you move around (mobility aids) if they are needed. These include: Canes. Walkers. Scooters. Crutches. Turn on the lights when you go into a dark area. Replace any light bulbs as soon as they burn out. Set up your furniture so you have a clear path. Avoid moving your furniture around. If any of your floors are uneven, fix them. If there are any pets around you, be aware of where they are. Review your medicines with your doctor. Some medicines can make you feel dizzy. This can increase your chance of falling. Ask your doctor what other things that you can do to help prevent falls. This information is not intended to replace advice given to you by your health care provider. Make sure you discuss any questions you have with your health care provider. Document Released: 03/02/2009 Document Revised: 10/12/2015 Document Reviewed: 06/10/2014 Elsevier Interactive Patient Education  2017 [employer].

## 2021-05-16 NOTE — Progress Notes (Addendum)
Subjective:   Barbara Boyd is a 70 y.o. female who presents for an Initial Medicare Annual Wellness Visit. Virtual Visit via Telephone Note  I connected with  Atoya A Carrero on 05/16/21 at 10:30 AM EST by telephone and verified that I am speaking with the correct person using two identifiers.  Location: Patient: HOME Provider: [facility] Persons participating in the virtual visit: patient/Nurse Health Advisor   I discussed the limitations, risks, security and privacy concerns of performing an evaluation and management service by telephone and the availability of in person appointments. The patient expressed understanding and agreed to proceed.  Interactive audio and video telecommunications were attempted between this nurse and patient, however failed, due to patient having technical difficulties OR patient did not have access to video capability.  We continued and completed visit with audio only.  Some vital signs may be absent or patient reported.   Chriss Driver, LPN  Review of Systems     Cardiac Risk Factors include: advanced age (>70men, >22 women);dyslipidemia;smoking/ tobacco exposure;sedentary lifestyle;obesity (BMI >30kg/m2)     Objective:    Today's Vitals   05/16/21 1037  Weight: 181 lb (82.1 kg)  Height: 5\' 2"  (1.575 m)   Body mass index is 33.11 kg/m.  Advanced Directives 05/16/2021 04/18/2014  Does Patient Have a Medical Advance Directive? No No  Would patient like information on creating a medical advance directive? No - Patient declined -    Current Medications (verified) Outpatient Encounter Medications as of 05/16/2021  Medication Sig   Calcipotriene-Betameth Diprop (ENSTILAR EX) Apply topically. Sample from MD   escitalopram (LEXAPRO) 20 MG tablet Take 1 tablet (20 mg total) by mouth daily.   Fluticasone-Salmeterol (ADVAIR) 250-50 MCG/DOSE AEPB Inhale 1 puff into the lungs every 12 (twelve) hours.   hydrochlorothiazide (HYDRODIURIL) 25 MG tablet  Take 1 tablet (25 mg total) by mouth daily.   MELATONIN PO Take by mouth.   meloxicam (MOBIC) 15 MG tablet TAKE 1 TABLET (15 MG TOTAL) BY MOUTH DAILY. (NEEDS TO BE SEEN BEFORE NEXT REFILL)   methimazole (TAPAZOLE) 5 MG tablet TAKE 1/2 TABLET BY MOUTH DAILY   omeprazole (PRILOSEC) 20 MG capsule Take 1 capsule (20 mg total) by mouth 2 (two) times daily before a meal.   simvastatin (ZOCOR) 40 MG tablet TAKE 1 TABLET BY MOUTH EVERY DAY   SUMAtriptan (IMITREX) 100 MG tablet Take 1 tablet (100 mg total) by mouth as needed. May repeat in 2 hours if headache persists or recurs.   tiotropium (SPIRIVA HANDIHALER) 18 MCG inhalation capsule Place 1 capsule (18 mcg total) into inhaler and inhale daily.   tolterodine (DETROL LA) 4 MG 24 hr capsule TAKE 1 CAPSULE BY MOUTH EVERY DAY   triamcinolone ointment (KENALOG) 0.1 % APPLY TWICE A DAY FOR 2 WEEKS   zolpidem (AMBIEN) 10 MG tablet Take 1 tablet (10 mg total) by mouth at bedtime as needed for sleep.   No facility-administered encounter medications on file as of 05/16/2021.    Allergies (verified) Sulfa antibiotics, Aspirin, and Penicillins   History: Past Medical History:  Diagnosis Date   Arthritis    Asthma    Cancer ([place]) 09-2013   left Br. CA, had bilat mastectomy and doing reconstruction now   Cataract    removed both    COPD (chronic obstructive pulmonary disease) (HCC)    GERD (gastroesophageal reflux disease)    Hyperlipidemia    Osteopenia    borderline   Thyroid disease  Past Surgical History:  Procedure Laterality Date   BILATERAL TOTAL MASTECTOMY WITH AXILLARY LYMPH NODE DISSECTION Bilateral    BREAST RECONSTRUCTION     COLONOSCOPY     POLYPECTOMY     Family History  Problem Relation Age of Onset   Lung cancer Father    Arthritis Mother    Osteoporosis Mother    Dementia Mother    Breast cancer Maternal Aunt    Breast cancer Paternal Aunt    Breast cancer Maternal Aunt    Osteoporosis Maternal Grandmother    Breast  cancer Maternal Grandmother    Colon cancer Neg Hx    Rectal cancer Neg Hx    Stomach cancer Neg Hx    Colon polyps Neg Hx    Heart disease Neg Hx    Social History   Socioeconomic History   Marital status: Divorced    Spouse name: Not on file   Number of children: 2   Years of education: Not on file   Highest education level: Not on file  Occupational History   Not on file  Tobacco Use   Smoking status: Every Day    Packs/day: 2.00    Types: Cigarettes    Start date: 01/28/1973   Smokeless tobacco: Never  Vaping Use   Vaping Use: Never used  Substance and Sexual Activity   Alcohol use: No    Alcohol/week: 0.0 standard drinks   Drug use: No   Sexual activity: Not on file  Other Topics Concern   Not on file  Social History Narrative   1 son died in 2020-07-15 at age 70. Had Muscular Dystrophy.   1 son living and lives close by.    1 grandson.   Social Determinants of Health   Financial Resource Strain: Low Risk    Difficulty of Paying Living Expenses: Not hard at all  Food Insecurity: No Food Insecurity   Worried About Charity fundraiser in the Last Year: Never true   [place] in the Last Year: Never true  Transportation Needs: No Transportation Needs   Lack of Transportation (Medical): No   Lack of Transportation (Non-Medical): No  Physical Activity: Inactive   Days of Exercise per Week: 0 days   Minutes of Exercise per Session: 0 min  Stress: No Stress Concern Present   Feeling of Stress : Only a little  Social Connections: Socially Isolated   Frequency of Communication with Friends and Family: More than three times a week   Frequency of Social Gatherings with Friends and Family: More than three times a week   Attends Religious Services: Never   Marine scientist or Organizations: No   Attends Music therapist: Never   Marital Status: Divorced    Tobacco Counseling Ready to quit: Not Answered Counseling given: Not  Answered   Clinical Intake:  Pre-visit preparation completed: Yes  Pain : No/denies pain     BMI - recorded: 33.11 Nutritional Status: BMI > 30  Obese Nutritional Risks: None Diabetes: No  How often do you need to have someone help you when you read instructions, pamphlets, or other written materials from your doctor or pharmacy?: 1 - Never  Diabetic?NO  Interpreter Needed?: No  Information entered by :: MJ Saskia Simerson, LPN   Activities of Daily Living In your present state of health, do you have any difficulty performing the following activities: 05/16/2021 05/23/2020  Hearing? N N  Vision? N N  Difficulty concentrating  or making decisions? N N  Walking or climbing stairs? N N  Dressing or bathing? N N  Doing errands, shopping? N N  Preparing Food and eating ? N -  Using the Toilet? N -  In the past six months, have you accidently leaked urine? Y -  Comment Currently takes Detrol. -  Do you have problems with loss of bowel control? N -  Managing your Medications? N -  Managing your Finances? N -  Housekeeping or managing your Housekeeping? N -  Some recent data might be hidden    Patient Care Team: Chevis Pretty, FNP as PCP - General (Family Medicine)  Indicate any recent Medical Services you may have received from other than [facility] providers in the past year (date may be approximate).     Assessment:   This is a routine wellness examination for [place].  Hearing/Vision screen Hearing Screening - Comments:: No hearing issues. Vision Screening - Comments:: Readers. Dr. Radford Pax in [place]. 2022.  Dietary issues and exercise activities discussed: Current Exercise Habits: The patient does not participate in regular exercise at present, Exercise limited by: cardiac condition(s);respiratory conditions(s)   Goals Addressed             This Visit's Progress    DIET - REDUCE CALORIE INTAKE       Pt would like to lose weight       Depression Screen PHQ 2/9  Scores 05/16/2021 11/23/2020 05/23/2020 03/27/2020 10/12/2019 04/27/2019 10/22/2018  PHQ - 2 Score 1 0 0 2 3 0 0  PHQ- 9 Score - 2 - 6 8 - -    Fall Risk Fall Risk  05/16/2021 11/23/2020 05/23/2020 10/12/2019 04/27/2019  Falls in the past year? 1 0 0 0 0  Number falls in past yr: 1 - - - -  Injury with Fall? 0 - - - -  Comment - - - - -  Risk for fall due to : Impaired balance/gait;History of fall(s) - - - -  Follow up Falls prevention discussed - - - -  Comment Balance and strengthening exercises mailed to patient. - - - -    FALL RISK PREVENTION PERTAINING TO THE HOME:  Any stairs in or around the home? Yes  If so, are there any without handrails? No  Home free of loose throw rugs in walkways, pet beds, electrical cords, etc? Yes  Adequate lighting in your home to reduce risk of falls? Yes   ASSISTIVE DEVICES UTILIZED TO PREVENT FALLS:  Life alert? No  Use of a cane, walker or w/c? No  Grab bars in the bathroom? Yes  Shower chair or bench in shower? Yes  Elevated toilet seat or a handicapped toilet? Yes   TIMED UP AND GO:  Was the test performed? No .  Phone visit.  Cognitive Function: Pt declined test. Normal cognitive status assessed by direct observation by this Nurse Health Advisor. No abnormalities found.          Immunizations Immunization History  Administered Date(s) Administered   Fluad Quad(high Dose 65+) 03/10/2019, 05/17/2020   Influenza, High Dose Seasonal PF 03/13/2016, 01/27/2018, 05/07/2021   Influenza,inj,Quad PF,6+ Mos 03/17/2015   Influenza-Unspecified 01/27/2018   Moderna SARS-COV2 Booster Vaccination 05/17/2020   Moderna Sars-Covid-2 Vaccination 07/22/2019, 08/27/2019   Pneumococcal Conjugate-13 07/24/2015   Pneumococcal Polysaccharide-23 08/26/2016   Tdap 04/27/2019   Zoster Recombinat (Shingrix) 05/07/2021   Zoster, Live 07/24/2015    TDAP status: Up to date  Flu Vaccine status: Up to date  Pneumococcal vaccine status: Up to date  Covid-19  vaccine status: Completed vaccines  Qualifies for Shingles Vaccine? Yes   Zostavax completed Yes   Shingrix Completed?: No.    Education has been provided regarding the importance of this vaccine. Patient has been advised to call insurance company to determine out of pocket expense if they have not yet received this vaccine. Advised may also receive vaccine at local pharmacy or Health Dept. Verbalized acceptance and understanding.  Screening Tests Health Maintenance  Topic Date Due   COVID-19 Vaccine (3 - Moderna risk series) 06/14/2020   Zoster Vaccines- Shingrix (2 of 2) 07/02/2021   COLONOSCOPY (Pts 45-80yrs Insurance coverage will need to be confirmed)  01/12/2025   TETANUS/TDAP  04/26/2029   Pneumonia Vaccine 53+ Years old  Completed   INFLUENZA VACCINE  Completed   DEXA SCAN  Completed   Hepatitis C Screening  Completed   HPV VACCINES  Aged Out    Health Maintenance  Health Maintenance Due  Topic Date Due   COVID-19 Vaccine (3 - Moderna risk series) 06/14/2020    Colorectal cancer screening: Type of screening: Colonoscopy. Completed 01/13/2020. Repeat every 5 years  Mammogram status: No longer required due to Double mastectomy.  Bone Density status: Completed 05/23/2020. Results reflect: Bone density results: OSTEOPENIA. Repeat every 2 years.  Lung Cancer Screening: (Low Dose CT Chest recommended if Age 80-80 years, 30 pack-year currently smoking OR have quit w/in 15years.) does qualify.   Lung Cancer Screening Referral: Last screening was done 07/14/2020.  Additional Screening:  Hepatitis C Screening: does qualify; Completed 03/17/2015  Vision Screening: Recommended annual ophthalmology exams for early detection of glaucoma and other disorders of the eye. Is the patient up to date with their annual eye exam?  Yes  Who is the provider or what is the name of the office in which the patient attends annual eye exams? Dr. Radford Pax in [place]. If pt is not established with a  provider, would they like to be referred to a provider to establish care? No .   Dental Screening: Recommended annual dental exams for proper oral hygiene  Community Resource Referral / Chronic Care Management: CRR required this visit?  No   CCM required this visit?  No      Plan:     I have personally reviewed and noted the following in the patients chart:   Medical and social history Use of alcohol, tobacco or illicit drugs  Current medications and supplements including opioid prescriptions. Patient is not currently taking opioid prescriptions. Functional ability and status Nutritional status Physical activity Advanced directives List of other physicians Hospitalizations, surgeries, and ER visits in previous 12 months Vitals Screenings to include cognitive, depression, and falls Referrals and appointments  In addition, I have reviewed and discussed with patient certain preventive protocols, quality metrics, and best practice recommendations. A written personalized care plan for preventive services as well as general preventive health recommendations were provided to patient.     Chriss Driver, LPN   [id number]   Nurse Notes: Pt is up to date on all age appropriate vaccines and health maintenance. Pt c/o some issues with depression since death of son and the holidays. Offered counseling services but pt declined at this time. Pt states she feels like she is handling things okay with her medication. Declined 6CIT screen. PHONE VISIT. PT AT HOME. NURSE AT [facility].   I have reviewed and agree with the above AWV documentation.   Caprisha Bridgett-Margaret Hassell Done, FNP

## 2021-05-17 ENCOUNTER — Other Ambulatory Visit: Payer: Self-pay | Admitting: Nurse Practitioner

## 2021-05-28 ENCOUNTER — Encounter: Payer: Self-pay | Admitting: Nurse Practitioner

## 2021-05-28 ENCOUNTER — Ambulatory Visit: Payer: Medicare Other | Admitting: Nurse Practitioner

## 2021-05-28 ENCOUNTER — Other Ambulatory Visit: Payer: Self-pay | Admitting: Nurse Practitioner

## 2021-05-28 VITALS — BP 131/70 | HR 71 | Temp 98.1°F | Resp 20 | Ht 62.0 in | Wt 177.0 lb

## 2021-05-28 DIAGNOSIS — I10 Essential (primary) hypertension: Secondary | ICD-10-CM

## 2021-05-28 DIAGNOSIS — J4489 Other specified chronic obstructive pulmonary disease: Secondary | ICD-10-CM

## 2021-05-28 DIAGNOSIS — K219 Gastro-esophageal reflux disease without esophagitis: Secondary | ICD-10-CM

## 2021-05-28 DIAGNOSIS — M8588 Other specified disorders of bone density and structure, other site: Secondary | ICD-10-CM

## 2021-05-28 DIAGNOSIS — F17200 Nicotine dependence, unspecified, uncomplicated: Secondary | ICD-10-CM

## 2021-05-28 DIAGNOSIS — E782 Mixed hyperlipidemia: Secondary | ICD-10-CM

## 2021-05-28 DIAGNOSIS — R3915 Urgency of urination: Secondary | ICD-10-CM

## 2021-05-28 DIAGNOSIS — Z6832 Body mass index (BMI) 32.0-32.9, adult: Secondary | ICD-10-CM

## 2021-05-28 DIAGNOSIS — M1990 Unspecified osteoarthritis, unspecified site: Secondary | ICD-10-CM

## 2021-05-28 DIAGNOSIS — E0500 Thyrotoxicosis with diffuse goiter without thyrotoxic crisis or storm: Secondary | ICD-10-CM

## 2021-05-28 DIAGNOSIS — J449 Chronic obstructive pulmonary disease, unspecified: Secondary | ICD-10-CM

## 2021-05-28 DIAGNOSIS — E559 Vitamin D deficiency, unspecified: Secondary | ICD-10-CM

## 2021-05-28 DIAGNOSIS — G43001 Migraine without aura, not intractable, with status migrainosus: Secondary | ICD-10-CM | POA: Diagnosis not present

## 2021-05-28 DIAGNOSIS — F3342 Major depressive disorder, recurrent, in full remission: Secondary | ICD-10-CM

## 2021-05-28 DIAGNOSIS — F5101 Primary insomnia: Secondary | ICD-10-CM

## 2021-05-28 MED ORDER — METHIMAZOLE 5 MG PO TABS: ORAL_TABLET | ORAL | 1 refills | Status: DC

## 2021-05-28 MED ORDER — SIMVASTATIN 40 MG PO TABS: ORAL_TABLET | ORAL | 1 refills | Status: DC

## 2021-05-28 MED ORDER — ESCITALOPRAM OXALATE 20 MG PO TABS: 20.0000 mg | ORAL_TABLET | Freq: Every day | ORAL | 1 refills | Status: DC

## 2021-05-28 MED ORDER — OMEPRAZOLE 20 MG PO CPDR: 20.0000 mg | DELAYED_RELEASE_CAPSULE | Freq: Two times a day (BID) | ORAL | 1 refills | Status: DC

## 2021-05-28 MED ORDER — TOLTERODINE TARTRATE ER 4 MG PO CP24: ORAL_CAPSULE | ORAL | 1 refills | Status: DC

## 2021-05-28 MED ORDER — SPIRIVA HANDIHALER 18 MCG IN CAPS: 18.0000 ug | ORAL_CAPSULE | Freq: Every day | RESPIRATORY_TRACT | 5 refills | Status: DC

## 2021-05-28 MED ORDER — HYDROCHLOROTHIAZIDE 25 MG PO TABS: 25.0000 mg | ORAL_TABLET | Freq: Every day | ORAL | 1 refills | Status: DC

## 2021-05-28 MED ORDER — FLUTICASONE-SALMETEROL 250-50 MCG/ACT IN AEPB
1.0000 | INHALATION_SPRAY | Freq: Two times a day (BID) | RESPIRATORY_TRACT | 1 refills | Status: DC
Start: 2021-05-28 — End: 2021-11-26

## 2021-05-28 NOTE — Progress Notes (Signed)
Subjective:    Patient ID: Barbara Boyd, female    DOB: 24-Jan-1951, 71 y.o.   MRN: [id number]   Chief Complaint: Medical Management of Chronic Issues    HPI:  Barbara Boyd is a 71 y.o. who identifies as a female who was assigned female at birth.   Social history: Lives with: husband Work history: retired   Scientist, forensic in today for follow up of the following chronic medical issues:  1. Essential hypertension No c/o chest pain, or sob. Doe nsot check blood pressure at home. BP Readings from Last 3 Encounters:  05/28/21 131/70  11/23/20 126/64  05/23/20 134/72     2. COPD with chronic bronchitis (HCC) Has chronic cough is on adair and Spiriva- she does not use her inhalers daily. Is still smoking.  3. Gastroesophageal reflux disease without esophagitis Is on omeprazole dialy and that is working well for her.  4. Migraine without aura and with status migrainosus, not intractable Only has occasionally. Maybe a couple of times a year.  5. Graves disease Is on tapazole daily. No problems that she is aware of. Lab Results  Component Value Date   TSH 2.180 11/23/2020     6. Arthritis Varying joints. Has some discomfort  daly  7. Recurrent major depressive disorder, in full remission [facility]) She is on lexapro and is doing well. No medication side effects. Depression screen [facility] 2/9 05/28/2021 05/16/2021 11/23/2020  Decreased Interest 0 0 0  Down, Depressed, Hopeless 0 1 0  PHQ - 2 Score 0 1 0  Altered sleeping 1 - 1  Tired, decreased energy 1 - 1  Change in appetite 0 - 0  Feeling bad or failure about yourself  0 - 0  Trouble concentrating 0 - 0  Moving slowly or fidgety/restless 0 - 0  Suicidal thoughts 0 - 0  PHQ-9 Score 2 - 2  Difficult doing work/chores Not difficult at all - Not difficult at all  Some recent data might be hidden     8. Mixed hyperlipidemia Does not watch diet and does no dedicated exercise. Lab Results  Component Value Date   CHOL 144  11/23/2020   HDL 56 11/23/2020   LDLCALC 76 11/23/2020   TRIG 54 11/23/2020   CHOLHDL 2.6 11/23/2020  The 10-year ASCVD risk score (Arnett DK, et al., 2019) is: 18.9%     9. Primary insomnia Is on ambien to sleep but doe snot take every night. Takes melatonin usually. Styaing asleep seems to be the issue.  10. Urinary urgency Is on detrol LA. Still has to go to the restroom frequently.  11. Smoker Smokes over a pack a day. Last low dose CT scan was done 06/2020  12. Vitamin D deficiency Is on vitamin d supplement- she forget to take it.  13. Osteopenia of lumbar spine Dexascan was done 05/23/20. T score was -2.3. Does no weight bearing exercises  14. BMI 33.0-33.9,adult Weight is down 4lbs Wt Readings from Last 3 Encounters:  05/28/21 177 lb (80.3 kg)  05/16/21 181 lb (82.1 kg)  11/23/20 181 lb (82.1 kg)   BMI Readings from Last 3 Encounters:  05/28/21 32.37 kg/m  05/16/21 33.11 kg/m  11/23/20 33.11 kg/m       New complaints: None today  Allergies  Allergen Reactions   Sulfa Antibiotics Hives   Aspirin Swelling and Rash    Swelling of face and throat   Penicillins Hives and Rash   Outpatient Encounter Medications as of 05/28/2021  Medication Sig   escitalopram (LEXAPRO) 20 MG tablet Take 1 tablet (20 mg total) by mouth daily.   Fluticasone-Salmeterol (ADVAIR) 250-50 MCG/DOSE AEPB Inhale 1 puff into the lungs every 12 (twelve) hours.   hydrochlorothiazide (HYDRODIURIL) 25 MG tablet Take 1 tablet (25 mg total) by mouth daily.   MELATONIN PO Take by mouth.   meloxicam (MOBIC) 15 MG tablet TAKE 1 TABLET (15 MG TOTAL) BY MOUTH DAILY. (NEEDS TO BE SEEN BEFORE NEXT REFILL)   methimazole (TAPAZOLE) 5 MG tablet TAKE 1/2 TABLET BY MOUTH DAILY   omeprazole (PRILOSEC) 20 MG capsule Take 1 capsule (20 mg total) by mouth 2 (two) times daily before a meal.   simvastatin (ZOCOR) 40 MG tablet TAKE 1 TABLET BY MOUTH EVERY DAY   SUMAtriptan (IMITREX) 100 MG tablet Take 1 tablet  (100 mg total) by mouth as needed. May repeat in 2 hours if headache persists or recurs.   tiotropium (SPIRIVA HANDIHALER) 18 MCG inhalation capsule Place 1 capsule (18 mcg total) into inhaler and inhale daily.   tolterodine (DETROL LA) 4 MG 24 hr capsule TAKE 1 CAPSULE BY MOUTH EVERY DAY   triamcinolone ointment (KENALOG) 0.1 % APPLY TWICE A DAY FOR 2 WEEKS   [DISCONTINUED] Calcipotriene-Betameth Diprop (ENSTILAR EX) Apply topically. Sample from MD   zolpidem (AMBIEN) 10 MG tablet Take 1 tablet (10 mg total) by mouth at bedtime as needed for sleep.   No facility-administered encounter medications on file as of 05/28/2021.    Past Surgical History:  Procedure Laterality Date   BILATERAL TOTAL MASTECTOMY WITH AXILLARY LYMPH NODE DISSECTION Bilateral    BREAST RECONSTRUCTION     COLONOSCOPY     POLYPECTOMY      Family History  Problem Relation Age of Onset   Lung cancer Father    Arthritis Mother    Osteoporosis Mother    Dementia Mother    Breast cancer Maternal Aunt    Breast cancer Paternal Aunt    Breast cancer Maternal Aunt    Osteoporosis Maternal Grandmother    Breast cancer Maternal Grandmother    Colon cancer Neg Hx    Rectal cancer Neg Hx    Stomach cancer Neg Hx    Colon polyps Neg Hx    Heart disease Neg Hx       Controlled substance contract: 11/27/20     Review of Systems  Constitutional:  Positive for appetite change. Negative for diaphoresis.  Eyes:  Negative for pain.  Respiratory:  Negative for shortness of breath.   Cardiovascular:  Negative for chest pain, palpitations and leg swelling.  Gastrointestinal:  Negative for abdominal pain.  Endocrine: Negative for polydipsia.  Skin:  Negative for rash.  Neurological:  Negative for dizziness, weakness and headaches.  Hematological:  Does not bruise/bleed easily.  All other systems reviewed and are negative.     Objective:   Physical Exam Vitals and nursing note reviewed.  Constitutional:       General: She is not in acute distress.    Appearance: Normal appearance. She is well-developed.  HENT:     Head: Normocephalic.     Right Ear: Tympanic membrane normal.     Left Ear: Tympanic membrane normal.     Nose: Nose normal.     Mouth/Throat:     Mouth: Mucous membranes are moist.  Eyes:     Pupils: Pupils are equal, round, and reactive to light.  Neck:     Vascular: No carotid bruit or JVD.  Cardiovascular:  Rate and Rhythm: Normal rate and regular rhythm.     Heart sounds: Normal heart sounds.  Pulmonary:     Effort: Pulmonary effort is normal. No respiratory distress.     Breath sounds: Normal breath sounds. No wheezing or rales.  Chest:     Chest wall: No tenderness.  Abdominal:     General: Bowel sounds are normal. There is no distension or abdominal bruit.     Palpations: Abdomen is soft. There is no hepatomegaly, splenomegaly, mass or pulsatile mass.     Tenderness: There is no abdominal tenderness.  Musculoskeletal:        General: Normal range of motion.     Cervical back: Normal range of motion and neck supple.  Lymphadenopathy:     Cervical: No cervical adenopathy.  Skin:    General: Skin is warm and dry.  Neurological:     Mental Status: She is alert and oriented to person, place, and time.     Deep Tendon Reflexes: Reflexes are normal and symmetric.  Psychiatric:        Behavior: Behavior normal.        Thought Content: Thought content normal.        Judgment: Judgment normal.    BP 131/70    Pulse 71    Temp 98.1 F (36.7 C) (Temporal)    Resp 20    Ht _0  (1.575 m)    Wt 177 lb (80.3 kg)    SpO2 98%    BMI 32.37 kg/m        Assessment & Plan:   Cayle Cordoba Tyminski comes in today with chief complaint of Medical Management of Chronic Issues   Diagnosis and orders addressed:  1. Essential hypertension Low sodium diet - hydrochlorothiazide (HYDRODIURIL) 25 MG tablet; Take 1 tablet (25 mg total) by mouth daily.  Dispense: 90 tablet; Refill:  1 - CBC with Differential/Platelet - CMP14+EGFR  2. COPD with chronic bronchitis (HCC) Smoking cessation - tiotropium (SPIRIVA HANDIHALER) 18 MCG inhalation capsule; Place 1 capsule (18 mcg total) into inhaler and inhale daily.  Dispense: 30 capsule; Refill: 5  3. Gastroesophageal reflux disease without esophagitis Avoid spicy foods Do not eat 2 hours prior to bedtime - omeprazole (PRILOSEC) 20 MG capsule; Take 1 capsule (20 mg total) by mouth 2 (two) times daily before a meal.  Dispense: 180 capsule; Refill: 1  4. Migraine without aura and with status migrainosus, not intractable Mitrex as needed  5. Graves disease Labs pending - methimazole (TAPAZOLE) 5 MG tablet; TAKE 1/2 TABLET BY MOUTH DAILY  Dispense: 90 tablet; Refill: 1 - Thyroid Panel With TSH  6. Arthritis  7. Recurrent major depressive disorder, in full remission ([place]) Stress management - escitalopram (LEXAPRO) 20 MG tablet; Take 1 tablet (20 mg total) by mouth daily.  Dispense: 90 tablet; Refill: 1  8. Mixed hyperlipidemia Low fat diet - simvastatin (ZOCOR) 40 MG tablet; TAKE 1 TABLET BY MOUTH EVERY DAY  Dispense: 90 tablet; Refill: 1 - Lipid panel  9. Primary insomnia Bedtime routine  10. Urinary urgency Continue detrol LA as prescribed  11. Smoker Smoking cessation encouraged Will schedule low dose CT scan fr march  12. Vitamin D deficiency Continue daily vitamin d supplement  13. Osteopenia of lumbar spine Weight bearing exercises encouraged  14. BMI 32.0-32.9,adult Discussed diet and exercise for person with BMI >25 Will recheck weight in 3-6 months     Labs pending Health Maintenance reviewed Diet and exercise encouraged  Follow  up plan: 6 months   [name], FNP

## 2021-05-28 NOTE — Patient Instructions (Signed)

## 2021-05-29 LAB — CMP14+EGFR
ALT: 14 IU/L (ref 0–32)
AST: 27 IU/L (ref 0–40)
Albumin/Globulin Ratio: 1.8 (ref 1.2–2.2)
Albumin: 4.1 g/dL (ref 3.8–4.8)
Alkaline Phosphatase: 81 IU/L (ref 44–121)
BUN/Creatinine Ratio: 24 (ref 12–28)
BUN: 18 mg/dL (ref 8–27)
Bilirubin Total: 0.4 mg/dL (ref 0.0–1.2)
CO2: 27 mmol/L (ref 20–29)
Calcium: 9.2 mg/dL (ref 8.7–10.3)
Chloride: 92 mmol/L — ABNORMAL LOW (ref 96–106)
Creatinine, Ser: 0.74 mg/dL (ref 0.57–1.00)
Globulin, Total: 2.3 g/dL (ref 1.5–4.5)
Glucose: 92 mg/dL (ref 70–99)
Potassium: 5 mmol/L (ref 3.5–5.2)
Sodium: 131 mmol/L — ABNORMAL LOW (ref 134–144)
Total Protein: 6.4 g/dL (ref 6.0–8.5)
eGFR: 87 mL/min/{1.73_m2} (ref >59)

## 2021-05-29 LAB — LIPID PANEL
Chol/HDL Ratio: 2.3 ratio (ref 0.0–4.4)
Cholesterol, Total: 156 mg/dL (ref 100–199)
HDL: 67 mg/dL (ref >39)
LDL Chol Calc (NIH): 78 mg/dL (ref 0–99)
Triglycerides: 51 mg/dL (ref 0–149)
VLDL Cholesterol Cal: 11 mg/dL (ref 5–40)

## 2021-05-29 LAB — CBC WITH DIFFERENTIAL/PLATELET
Basophils Absolute: 0.1 10*3/uL (ref 0.0–0.2)
Basos: 1 %
EOS (ABSOLUTE): 0.3 10*3/uL (ref 0.0–0.4)
Eos: 4 %
Hematocrit: 38.4 % (ref 34.0–46.6)
Hemoglobin: 13.3 g/dL (ref 11.1–15.9)
Immature Grans (Abs): 0 10*3/uL (ref 0.0–0.1)
Immature Granulocytes: 0 %
Lymphocytes Absolute: 1.5 10*3/uL (ref 0.7–3.1)
Lymphs: 26 %
MCH: 33 pg (ref 26.6–33.0)
MCHC: 34.6 g/dL (ref 31.5–35.7)
MCV: 95 fL (ref 79–97)
Monocytes Absolute: 0.7 10*3/uL (ref 0.1–0.9)
Monocytes: 11 %
Neutrophils Absolute: 3.5 10*3/uL (ref 1.4–7.0)
Neutrophils: 58 %
Platelets: 310 10*3/uL (ref 150–450)
RBC: 4.03 x10E6/uL (ref 3.77–5.28)
RDW: 11.9 % (ref 11.7–15.4)
WBC: 6 10*3/uL (ref 3.4–10.8)

## 2021-05-29 LAB — THYROID PANEL WITH TSH
Free Thyroxine Index: 2 (ref 1.2–4.9)
T3 Uptake Ratio: 26 % (ref 24–39)
T4, Total: 7.7 ug/dL (ref 4.5–12.0)
TSH: 2.05 u[IU]/mL (ref 0.450–4.500)

## 2021-05-29 LAB — VITAMIN D 25 HYDROXY (VIT D DEFICIENCY, FRACTURES): Vit D, 25-Hydroxy: 24.1 ng/mL — ABNORMAL LOW (ref 30.0–100.0)

## 2021-06-27 ENCOUNTER — Other Ambulatory Visit (HOSPITAL_COMMUNITY): Payer: Self-pay

## 2021-06-27 DIAGNOSIS — Z122 Encounter for screening for malignant neoplasm of respiratory organs: Secondary | ICD-10-CM

## 2021-06-27 DIAGNOSIS — Z87891 Personal history of nicotine dependence: Secondary | ICD-10-CM

## 2021-06-27 NOTE — Progress Notes (Signed)
Order placed for LDCT per protocol. LDCT scheduled for 03/27 at 1000.

## 2021-07-25 ENCOUNTER — Ambulatory Visit (INDEPENDENT_AMBULATORY_CARE_PROVIDER_SITE_OTHER): Payer: Medicare Other | Admitting: Family Medicine

## 2021-07-25 ENCOUNTER — Encounter: Payer: Self-pay | Admitting: Family Medicine

## 2021-07-25 ENCOUNTER — Ambulatory Visit (INDEPENDENT_AMBULATORY_CARE_PROVIDER_SITE_OTHER): Payer: Medicare Other

## 2021-07-25 VITALS — BP 128/63 | HR 77 | Temp 96.8°F | Ht 62.0 in | Wt 180.0 lb

## 2021-07-25 DIAGNOSIS — M25512 Pain in left shoulder: Secondary | ICD-10-CM | POA: Diagnosis not present

## 2021-07-25 DIAGNOSIS — S46012A Strain of muscle(s) and tendon(s) of the rotator cuff of left shoulder, initial encounter: Secondary | ICD-10-CM | POA: Diagnosis not present

## 2021-07-25 MED ORDER — BETAMETHASONE SOD PHOS & ACET 6 (3-3) MG/ML IJ SUSP
6.0000 mg | Freq: Once | INTRAMUSCULAR | Status: AC
  Administered 2021-07-25: 6 mg via INTRAMUSCULAR

## 2021-07-25 NOTE — Progress Notes (Signed)
Chief Complaint  ?Patient presents with  ? Shoulder Pain  ? Arm Pain  ? ? ?HPI ? ?Patient presents today for left shoulder and upper arm pain. Radiates to the elbow. Onset 6 weeks ago. INsidious onset, but accelerated. Now is 2-3 throb at rest. With use goes to a 10. Can't do her yard work. No relief with meloxicam or heat, ice. ? ?PMH: Smoking status noted ?ROS: Review of Systems  ?Constitutional:  Positive for activity change. Negative for appetite change, fatigue and fever.  ?Musculoskeletal:  Positive for arthralgias and myalgias.  ?Skin: Negative.  Negative for rash.  ?Neurological:  Negative for weakness.  ? ? ?Objective: ?BP 128/63   Pulse 77   Temp (!) 96.8 ?F (36 ?C)   Ht '5\' 2"'$  (1.575 m)   Wt 180 lb (81.6 kg)   SpO2 99%   BMI 32.92 kg/m?  ?Gen: NAD, alert, cooperative with exam ?HEENT: NCAT, EOMI, PERRL ?CV: RRR, good S1/S2, no murmur ?Resp: CTABL, no wheezes, non-labored ?Abd: SNTND, BS present, no guarding or organomegaly ?Ext: No edema, warm ?Neuro: Alert and oriented, No gross deficits ? ?Assessment and plan: ? ?1. Strain of left rotator cuff capsule, initial encounter   ?2. Acute pain of left shoulder   ? ? ?Meds ordered this encounter  ?Medications  ? betamethasone acetate-betamethasone sodium phosphate (CELESTONE) injection 6 mg  ? ? ?Orders Placed This Encounter  ?Procedures  ? DG Shoulder Left  ?  Standing Status:   Future  ?  Number of Occurrences:   1  ?  Standing Expiration Date:   08/25/2021  ?  Order Specific Question:   Reason for Exam (SYMPTOM  OR DIAGNOSIS REQUIRED)  ?  Answer:   pain, NKI  ?  Order Specific Question:   Preferred imaging location?  ?  Answer:   Internal  ? ? ?Follow up as needed. ? ?Claretta Fraise, MD ? ? ? ? ?

## 2021-08-13 ENCOUNTER — Ambulatory Visit (HOSPITAL_COMMUNITY)
Admission: RE | Admit: 2021-08-13 | Discharge: 2021-08-13 | Disposition: A | Discharge status: Home / Self Care | Payer: Medicare Other | Source: Ambulatory Visit | Attending: Physician Assistant | Admitting: Physician Assistant

## 2021-08-13 ENCOUNTER — Other Ambulatory Visit: Payer: Self-pay

## 2021-08-13 DIAGNOSIS — Z122 Encounter for screening for malignant neoplasm of respiratory organs: Secondary | ICD-10-CM | POA: Diagnosis present

## 2021-08-13 DIAGNOSIS — Z87891 Personal history of nicotine dependence: Secondary | ICD-10-CM | POA: Diagnosis not present

## 2021-09-17 ENCOUNTER — Other Ambulatory Visit: Payer: Self-pay | Admitting: Nurse Practitioner

## 2021-11-24 ENCOUNTER — Other Ambulatory Visit: Payer: Self-pay | Admitting: Nurse Practitioner

## 2021-11-24 DIAGNOSIS — K219 Gastro-esophageal reflux disease without esophagitis: Secondary | ICD-10-CM

## 2021-11-24 DIAGNOSIS — F3342 Major depressive disorder, recurrent, in full remission: Secondary | ICD-10-CM

## 2021-11-24 DIAGNOSIS — R3915 Urgency of urination: Secondary | ICD-10-CM

## 2021-11-26 ENCOUNTER — Encounter: Payer: Self-pay | Admitting: Nurse Practitioner

## 2021-11-26 ENCOUNTER — Ambulatory Visit: Payer: Medicare Other | Admitting: Nurse Practitioner

## 2021-11-26 VITALS — BP 130/73 | HR 65 | Temp 98.0°F | Resp 20 | Ht 62.0 in | Wt 176.0 lb

## 2021-11-26 DIAGNOSIS — M8588 Other specified disorders of bone density and structure, other site: Secondary | ICD-10-CM

## 2021-11-26 DIAGNOSIS — E0500 Thyrotoxicosis with diffuse goiter without thyrotoxic crisis or storm: Secondary | ICD-10-CM

## 2021-11-26 DIAGNOSIS — R5383 Other fatigue: Secondary | ICD-10-CM

## 2021-11-26 DIAGNOSIS — J449 Chronic obstructive pulmonary disease, unspecified: Secondary | ICD-10-CM | POA: Diagnosis not present

## 2021-11-26 DIAGNOSIS — Z6832 Body mass index (BMI) 32.0-32.9, adult: Secondary | ICD-10-CM

## 2021-11-26 DIAGNOSIS — I10 Essential (primary) hypertension: Secondary | ICD-10-CM

## 2021-11-26 DIAGNOSIS — F5101 Primary insomnia: Secondary | ICD-10-CM

## 2021-11-26 DIAGNOSIS — R3915 Urgency of urination: Secondary | ICD-10-CM

## 2021-11-26 DIAGNOSIS — E559 Vitamin D deficiency, unspecified: Secondary | ICD-10-CM

## 2021-11-26 DIAGNOSIS — E782 Mixed hyperlipidemia: Secondary | ICD-10-CM | POA: Diagnosis not present

## 2021-11-26 DIAGNOSIS — K219 Gastro-esophageal reflux disease without esophagitis: Secondary | ICD-10-CM

## 2021-11-26 DIAGNOSIS — F17200 Nicotine dependence, unspecified, uncomplicated: Secondary | ICD-10-CM

## 2021-11-26 DIAGNOSIS — F3342 Major depressive disorder, recurrent, in full remission: Secondary | ICD-10-CM

## 2021-11-26 DIAGNOSIS — C50912 Malignant neoplasm of unspecified site of left female breast: Secondary | ICD-10-CM

## 2021-11-26 MED ORDER — TOLTERODINE TARTRATE ER 4 MG PO CP24: 4.0000 mg | ORAL_CAPSULE | Freq: Every day | ORAL | 1 refills | Status: DC

## 2021-11-26 MED ORDER — SPIRIVA HANDIHALER 18 MCG IN CAPS
18.0000 ug | ORAL_CAPSULE | Freq: Every day | RESPIRATORY_TRACT | 5 refills | Status: DC
Start: 2021-11-26 — End: 2022-11-28

## 2021-11-26 MED ORDER — HYDROCHLOROTHIAZIDE 25 MG PO TABS: 25.0000 mg | ORAL_TABLET | Freq: Every day | ORAL | 1 refills | Status: DC

## 2021-11-26 MED ORDER — ESCITALOPRAM OXALATE 20 MG PO TABS: 20.0000 mg | ORAL_TABLET | Freq: Every day | ORAL | 1 refills | Status: DC

## 2021-11-26 MED ORDER — FLUTICASONE-SALMETEROL 250-50 MCG/ACT IN AEPB: 1.0000 | INHALATION_SPRAY | Freq: Two times a day (BID) | RESPIRATORY_TRACT | 1 refills | Status: DC

## 2021-11-26 MED ORDER — SIMVASTATIN 40 MG PO TABS: ORAL_TABLET | ORAL | 1 refills | Status: DC

## 2021-11-26 MED ORDER — OMEPRAZOLE 20 MG PO CPDR: 20.0000 mg | DELAYED_RELEASE_CAPSULE | Freq: Two times a day (BID) | ORAL | 1 refills | Status: DC

## 2021-11-26 MED ORDER — METHIMAZOLE 5 MG PO TABS
ORAL_TABLET | ORAL | 1 refills | Status: DC
Start: 2021-11-26 — End: 2022-05-30

## 2021-11-26 NOTE — Progress Notes (Signed)
Subjective:    Patient ID: Barbara Boyd, female    DOB: 12-23-1950, 71 y.o.   MRN: [id number]   Chief Complaint: medical management of chronic issues     HPI:  Barbara Boyd is a 71 y.o. who identifies as a female who was assigned female at birth.   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 Essential hypertension No c/o chest pain, sob or headache. Does not check blood pressure at home. BP Readings from Last 3 Encounters:  07/25/21 128/63  05/28/21 131/70  11/23/20 126/64     2. Mixed hyperlipidemia Does not watch diet at all and does no dedicated exercise. Lab Results  Component Value Date   CHOL 156 05/28/2021   HDL 67 05/28/2021   LDLCALC 78 05/28/2021   TRIG 51 05/28/2021   CHOLHDL 2.3 05/28/2021   The 10-year ASCVD risk score (Arnett DK, et al., 2019) is: 19.8%   3. COPD with chronic bronchitis ([place]) Is on spiriva and advair daily and is doing well. Says she has cough and wheezing- she does not use inhalers every day.  4. Gastroesophageal reflux disease without esophagitis Is on omeprazole daily and is doing well.  5. Graves disease Is on methimazole and is having no issues that she is aware of.  6. Urinary urgency Takes detrol LA daily which helps with symptoms. She still voids frequently , but is better.  7. Recurrent major depressive disorder, in full remission ([place]) Is on lexapro daily which is working well for her.    11/26/2021   10:45 AM 07/25/2021   11:32 AM 05/28/2021   10:15 AM  Depression screen PHQ 2/9  Decreased Interest 0 0 0  Down, Depressed, Hopeless 0 0 0  PHQ - 2 Score 0 0 0  Altered sleeping 2  1  Tired, decreased energy 0  1  Change in appetite 2  0  Feeling bad or failure about yourself  0  0  Trouble concentrating 0  0  Moving slowly or fidgety/restless 0  0  Suicidal thoughts 0  0  PHQ-9 Score 4  2  Difficult doing  work/chores Not difficult at all  Not difficult at all     8. Primary insomnia Takes ambien to sleep. She wakes up frequently. Has not been taking ambien lately.  9. Vitamin D deficiency Is on daily vitamin d supplement  10. Infiltrating ductal carcinoma of breast, stage 1, left (HCC) No flare ups since treatment was done  11. Osteopenia of lumbar spine Last dexascan was done on 05/23/20. Her t score was -2.3.   12. Smoker Smokes over a pack a day  13. BMI 32.0-32.9,adult Weight is down 4lbs  Wt Readings from Last 3 Encounters:  11/26/21 176 lb (79.8 kg)  07/25/21 180 lb (81.6 kg)  05/28/21 177 lb (80.3 kg)   BMI Readings from Last 3 Encounters:  11/26/21 32.19 kg/m  07/25/21 32.92 kg/m  05/28/21 32.37 kg/m     New complaints: None today  Allergies  Allergen Reactions   Sulfa Antibiotics Hives   Aspirin Swelling and Rash    Swelling of face and throat   Penicillins Hives and Rash   Outpatient Encounter Medications as of 11/26/2021  Medication Sig   escitalopram (LEXAPRO) 20 MG tablet TAKE 1 TABLET BY MOUTH EVERY DAY   fluticasone-salmeterol (ADVAIR DISKUS) 250-50 MCG/ACT AEPB Inhale 1 puff into  the lungs in the morning and at bedtime.   hydrochlorothiazide (HYDRODIURIL) 25 MG tablet Take 1 tablet (25 mg total) by mouth daily.   MELATONIN PO Take by mouth.   meloxicam (MOBIC) 15 MG tablet Take 1 tablet (15 mg total) by mouth daily.   methimazole (TAPAZOLE) 5 MG tablet TAKE 1/2 TABLET BY MOUTH DAILY   omeprazole (PRILOSEC) 20 MG capsule TAKE 1 CAPSULE (20 MG TOTAL) BY MOUTH 2 (TWO) TIMES DAILY BEFORE A MEAL.   simvastatin (ZOCOR) 40 MG tablet TAKE 1 TABLET BY MOUTH EVERY DAY   SUMAtriptan (IMITREX) 100 MG tablet Take 1 tablet (100 mg total) by mouth as needed. May repeat in 2 hours if headache persists or recurs.   tiotropium (SPIRIVA HANDIHALER) 18 MCG inhalation capsule Place 1 capsule (18 mcg total) into inhaler and inhale daily.   tolterodine (DETROL LA) 4 MG  24 hr capsule TAKE 1 CAPSULE BY MOUTH EVERY DAY   triamcinolone ointment (KENALOG) 0.1 % APPLY TWICE A DAY FOR 2 WEEKS   zolpidem (AMBIEN) 10 MG tablet Take 1 tablet (10 mg total) by mouth at bedtime as needed for sleep.   No facility-administered encounter medications on file as of 11/26/2021.    Past Surgical History:  Procedure Laterality Date   BILATERAL TOTAL MASTECTOMY WITH AXILLARY LYMPH NODE DISSECTION Bilateral    BREAST RECONSTRUCTION     COLONOSCOPY     POLYPECTOMY      Family History  Problem Relation Age of Onset   Lung cancer Father    Arthritis Mother    Osteoporosis Mother    Dementia Mother    Breast cancer Maternal Aunt    Breast cancer Paternal Aunt    Breast cancer Maternal Aunt    Osteoporosis Maternal Grandmother    Breast cancer Maternal Grandmother    Colon cancer Neg Hx    Rectal cancer Neg Hx    Stomach cancer Neg Hx    Colon polyps Neg Hx    Heart disease Neg Hx       Controlled substance contract: n/a     Review of Systems  Constitutional:  Negative for diaphoresis.  Eyes:  Negative for pain.  Respiratory:  Negative for shortness of breath.   Cardiovascular:  Negative for chest pain, palpitations and leg swelling.  Gastrointestinal:  Negative for abdominal pain.  Endocrine: Negative for polydipsia.  Skin:  Negative for rash.  Neurological:  Negative for dizziness, weakness and headaches.  Hematological:  Does not bruise/bleed easily.  All other systems reviewed and are negative.      Objective:   Physical Exam Vitals and nursing note reviewed.  Constitutional:      General: She is not in acute distress.    Appearance: Normal appearance. She is well-developed.  HENT:     Head: Normocephalic.     Right Ear: Tympanic membrane normal.     Left Ear: Tympanic membrane normal.     Nose: Nose normal.     Mouth/Throat:     Mouth: Mucous membranes are moist.  Eyes:     Pupils: Pupils are equal, round, and reactive to light.  Neck:      Vascular: No carotid bruit or JVD.  Cardiovascular:     Rate and Rhythm: Normal rate and regular rhythm.     Heart sounds: Normal heart sounds.  Pulmonary:     Effort: Pulmonary effort is normal. No respiratory distress.     Breath sounds: Wheezing (exp wheezes) present. No rales.  Chest:  Chest wall: No tenderness.  Abdominal:     General: Bowel sounds are normal. There is no distension or abdominal bruit.     Palpations: Abdomen is soft. There is no hepatomegaly, splenomegaly, mass or pulsatile mass.     Tenderness: There is no abdominal tenderness.  Musculoskeletal:        General: Normal range of motion.     Cervical back: Normal range of motion and neck supple.  Lymphadenopathy:     Cervical: No cervical adenopathy.  Skin:    General: Skin is warm and dry.  Neurological:     Mental Status: She is alert and oriented to person, place, and time.     Deep Tendon Reflexes: Reflexes are normal and symmetric.  Psychiatric:        Behavior: Behavior normal.        Thought Content: Thought content normal.        Judgment: Judgment normal.   BP 130/73   Pulse 65   Temp 98 F (36.7 C) (Temporal)   Resp 20   Ht _0  (1.575 m)   Wt 176 lb (79.8 kg)   SpO2 100%   BMI 32.19 kg/m          Assessment & Plan:   Ramsey A Nham comes in today with chief complaint of Medical Management of Chronic Issues   Diagnosis and orders addressed:  1. Essential hypertension Low sodium diet - hydrochlorothiazide (HYDRODIURIL) 25 MG tablet; Take 1 tablet (25 mg total) by mouth daily.  Dispense: 90 tablet; Refill: 1 - CBC with Differential/Platelet - CMP14+EGFR  2. Mixed hyperlipidemia Low fat diet - simvastatin (ZOCOR) 40 MG tablet; TAKE 1 TABLET BY MOUTH EVERY DAY  Dispense: 90 tablet; Refill: 1 - Lipid panel  3. COPD with chronic bronchitis (HCC) USE INHALERS DAILY - tiotropium (SPIRIVA HANDIHALER) 18 MCG inhalation capsule; Place 1 capsule (18 mcg total) into inhaler  and inhale daily.  Dispense: 30 capsule; Refill: 5 - fluticasone-salmeterol (ADVAIR DISKUS) 250-50 MCG/ACT AEPB; Inhale 1 puff into the lungs in the morning and at bedtime.  Dispense: 3 each; Refill: 1  4. Gastroesophageal reflux disease without esophagitis Avoid spicy foods Do not eat 2 hours prior to bedtime - omeprazole (PRILOSEC) 20 MG capsule; Take 1 capsule (20 mg total) by mouth 2 (two) times daily before a meal.  Dispense: 180 capsule; Refill: 1  5. Graves disease Labs oending - methimazole (TAPAZOLE) 5 MG tablet; TAKE 1/2 TABLET BY MOUTH DAILY  Dispense: 90 tablet; Refill: 1 - TSH  6. Urinary urgency - tolterodine (DETROL LA) 4 MG 24 hr capsule; Take 1 capsule (4 mg total) by mouth daily.  Dispense: 90 capsule; Refill: 1  7. Recurrent major depressive disorder, in full remission ([place]) Stress management - escitalopram (LEXAPRO) 20 MG tablet; Take 1 tablet (20 mg total) by mouth daily.  Dispense: 90 tablet; Refill: 1  8. Primary insomnia Bedtime routine  9. Vitamin D deficiency Continue daily vitamin d supplement  10. Infiltrating ductal carcinoma of breast, stage 1, left (HCC) Continue breast self exams- she says she has implants and they do not do mammograms on her [place].  11. Osteopenia of lumbar spine Weight bearing exercise  12. Smoker Smoking cessation encouraged  13. BMI 32.0-32.9,adult Discussed diet and exercise for person with BMI >25 Will recheck weight in 3-6 months    Labs pending Health Maintenance reviewed Diet and exercise encouraged  Follow up plan: 6 months   [name], FNP

## 2021-11-26 NOTE — Patient Instructions (Signed)

## 2021-11-26 NOTE — Addendum Note (Signed)
Addended by: Chevis Pretty on: 11/26/2021 11:14 AM   Modules accepted: Orders

## 2021-11-27 LAB — CMP14+EGFR
ALT: 12 IU/L (ref 0–32)
AST: 21 IU/L (ref 0–40)
Albumin/Globulin Ratio: 2 (ref 1.2–2.2)
Albumin: 4.3 g/dL (ref 3.8–4.8)
Alkaline Phosphatase: 82 IU/L (ref 44–121)
BUN/Creatinine Ratio: 10 — ABNORMAL LOW (ref 12–28)
BUN: 7 mg/dL — ABNORMAL LOW (ref 8–27)
Bilirubin Total: 0.3 mg/dL (ref 0.0–1.2)
CO2: 26 mmol/L (ref 20–29)
Calcium: 9.2 mg/dL (ref 8.7–10.3)
Chloride: 92 mmol/L — ABNORMAL LOW (ref 96–106)
Creatinine, Ser: 0.72 mg/dL (ref 0.57–1.00)
Globulin, Total: 2.2 g/dL (ref 1.5–4.5)
Glucose: 104 mg/dL — ABNORMAL HIGH (ref 70–99)
Potassium: 5 mmol/L (ref 3.5–5.2)
Sodium: 131 mmol/L — ABNORMAL LOW (ref 134–144)
Total Protein: 6.5 g/dL (ref 6.0–8.5)
eGFR: 89 mL/min/{1.73_m2} (ref >59)

## 2021-11-27 LAB — LIPID PANEL
Chol/HDL Ratio: 2.4 ratio (ref 0.0–4.4)
Cholesterol, Total: 149 mg/dL (ref 100–199)
HDL: 61 mg/dL (ref >39)
LDL Chol Calc (NIH): 76 mg/dL (ref 0–99)
Triglycerides: 58 mg/dL (ref 0–149)
VLDL Cholesterol Cal: 12 mg/dL (ref 5–40)

## 2021-11-27 LAB — CBC WITH DIFFERENTIAL/PLATELET
Basophils Absolute: 0 10*3/uL (ref 0.0–0.2)
Basos: 1 %
EOS (ABSOLUTE): 0.2 10*3/uL (ref 0.0–0.4)
Eos: 5 %
Hematocrit: 42.2 % (ref 34.0–46.6)
Hemoglobin: 14.2 g/dL (ref 11.1–15.9)
Immature Grans (Abs): 0 10*3/uL (ref 0.0–0.1)
Immature Granulocytes: 0 %
Lymphocytes Absolute: 1.5 10*3/uL (ref 0.7–3.1)
Lymphs: 31 %
MCH: 32.6 pg (ref 26.6–33.0)
MCHC: 33.6 g/dL (ref 31.5–35.7)
MCV: 97 fL (ref 79–97)
Monocytes Absolute: 0.6 10*3/uL (ref 0.1–0.9)
Monocytes: 12 %
Neutrophils Absolute: 2.5 10*3/uL (ref 1.4–7.0)
Neutrophils: 51 %
Platelets: 317 10*3/uL (ref 150–450)
RBC: 4.36 x10E6/uL (ref 3.77–5.28)
RDW: 12.3 % (ref 11.7–15.4)
WBC: 4.9 10*3/uL (ref 3.4–10.8)

## 2021-11-27 LAB — TSH: TSH: 2.69 u[IU]/mL (ref 0.450–4.500)

## 2021-11-27 LAB — VITAMIN D 25 HYDROXY (VIT D DEFICIENCY, FRACTURES): Vit D, 25-Hydroxy: 19.6 ng/mL — ABNORMAL LOW (ref 30.0–100.0)

## 2021-11-27 LAB — VITAMIN B12: Vitamin B-12: 803 pg/mL (ref 232–1245)

## 2021-11-27 MED ORDER — VITAMIN D (ERGOCALCIFEROL) 1.25 MG (50000 UNIT) PO CAPS: 50000.0000 [IU] | ORAL_CAPSULE | ORAL | 1 refills | Status: DC

## 2021-11-27 NOTE — Addendum Note (Signed)
Addended by: Chevis Pretty on: 11/27/2021 07:27 AM   Modules accepted: Orders

## 2021-11-28 ENCOUNTER — Other Ambulatory Visit: Payer: Self-pay | Admitting: Nurse Practitioner

## 2021-11-28 DIAGNOSIS — G43001 Migraine without aura, not intractable, with status migrainosus: Secondary | ICD-10-CM

## 2021-12-16 ENCOUNTER — Other Ambulatory Visit: Payer: Self-pay | Admitting: Nurse Practitioner

## 2021-12-30 ENCOUNTER — Other Ambulatory Visit: Payer: Self-pay | Admitting: Nurse Practitioner

## 2021-12-30 DIAGNOSIS — G43001 Migraine without aura, not intractable, with status migrainosus: Secondary | ICD-10-CM

## 2022-03-14 ENCOUNTER — Other Ambulatory Visit: Payer: Self-pay | Admitting: Nurse Practitioner

## 2022-05-30 ENCOUNTER — Encounter: Payer: Self-pay | Admitting: Nurse Practitioner

## 2022-05-30 ENCOUNTER — Ambulatory Visit: Payer: Medicare Other | Admitting: Nurse Practitioner

## 2022-05-30 VITALS — BP 137/80 | HR 75 | Temp 97.5°F | Resp 20 | Ht 62.0 in | Wt 178.0 lb

## 2022-05-30 DIAGNOSIS — K219 Gastro-esophageal reflux disease without esophagitis: Secondary | ICD-10-CM

## 2022-05-30 DIAGNOSIS — F3342 Major depressive disorder, recurrent, in full remission: Secondary | ICD-10-CM

## 2022-05-30 DIAGNOSIS — I10 Essential (primary) hypertension: Secondary | ICD-10-CM | POA: Diagnosis not present

## 2022-05-30 DIAGNOSIS — E782 Mixed hyperlipidemia: Secondary | ICD-10-CM

## 2022-05-30 DIAGNOSIS — J4489 Other specified chronic obstructive pulmonary disease: Secondary | ICD-10-CM

## 2022-05-30 DIAGNOSIS — E0500 Thyrotoxicosis with diffuse goiter without thyrotoxic crisis or storm: Secondary | ICD-10-CM

## 2022-05-30 DIAGNOSIS — E559 Vitamin D deficiency, unspecified: Secondary | ICD-10-CM

## 2022-05-30 DIAGNOSIS — Z23 Encounter for immunization: Secondary | ICD-10-CM

## 2022-05-30 DIAGNOSIS — R3915 Urgency of urination: Secondary | ICD-10-CM

## 2022-05-30 DIAGNOSIS — C50912 Malignant neoplasm of unspecified site of left female breast: Secondary | ICD-10-CM

## 2022-05-30 DIAGNOSIS — Z6830 Body mass index (BMI) 30.0-30.9, adult: Secondary | ICD-10-CM

## 2022-05-30 DIAGNOSIS — G43001 Migraine without aura, not intractable, with status migrainosus: Secondary | ICD-10-CM

## 2022-05-30 DIAGNOSIS — F17200 Nicotine dependence, unspecified, uncomplicated: Secondary | ICD-10-CM

## 2022-05-30 DIAGNOSIS — F5101 Primary insomnia: Secondary | ICD-10-CM

## 2022-05-30 DIAGNOSIS — M8588 Other specified disorders of bone density and structure, other site: Secondary | ICD-10-CM

## 2022-05-30 LAB — LIPID PANEL

## 2022-05-30 MED ORDER — METHIMAZOLE 5 MG PO TABS: ORAL_TABLET | ORAL | 1 refills | Status: DC

## 2022-05-30 MED ORDER — ZOLPIDEM TARTRATE 10 MG PO TABS: 10.0000 mg | ORAL_TABLET | Freq: Every evening | ORAL | 1 refills | Status: DC | PRN

## 2022-05-30 MED ORDER — OMEPRAZOLE 20 MG PO CPDR: 20.0000 mg | DELAYED_RELEASE_CAPSULE | Freq: Two times a day (BID) | ORAL | 1 refills | Status: DC

## 2022-05-30 MED ORDER — ESCITALOPRAM OXALATE 20 MG PO TABS: 20.0000 mg | ORAL_TABLET | Freq: Every day | ORAL | 1 refills | Status: DC

## 2022-05-30 MED ORDER — HYDROCHLOROTHIAZIDE 25 MG PO TABS
25.0000 mg | ORAL_TABLET | Freq: Every day | ORAL | 1 refills | Status: DC
Start: 2022-05-30 — End: 2022-11-28

## 2022-05-30 MED ORDER — VITAMIN D (ERGOCALCIFEROL) 1.25 MG (50000 UNIT) PO CAPS: 50000.0000 [IU] | ORAL_CAPSULE | ORAL | 1 refills | Status: DC

## 2022-05-30 MED ORDER — SUMATRIPTAN SUCCINATE 100 MG PO TABS: ORAL_TABLET | ORAL | 3 refills | Status: AC

## 2022-05-30 MED ORDER — TOLTERODINE TARTRATE ER 4 MG PO CP24: 4.0000 mg | ORAL_CAPSULE | Freq: Every day | ORAL | 1 refills | Status: DC

## 2022-05-30 MED ORDER — SIMVASTATIN 40 MG PO TABS: ORAL_TABLET | ORAL | 1 refills | Status: DC

## 2022-05-30 NOTE — Patient Instructions (Signed)
Insomnia Insomnia is a sleep disorder that makes it difficult to fall asleep or stay asleep. Insomnia can cause fatigue, low energy, difficulty concentrating, mood swings, and poor performance at work or school. There are three different ways to classify insomnia: Difficulty falling asleep. Difficulty staying asleep. Waking up too early in the morning. Any type of insomnia can be long-term (chronic) or short-term (acute). Both are common. Short-term insomnia usually lasts for 3 months or less. Chronic insomnia occurs at least three times a week for longer than 3 months. What are the causes? Insomnia may be caused by another condition, situation, or substance, such as: Having certain mental health conditions, such as anxiety and depression. Using caffeine, alcohol, tobacco, or drugs. Having gastrointestinal conditions, such as gastroesophageal reflux disease (GERD). Having certain medical conditions. These include: Asthma. Alzheimer's disease. Stroke. Chronic pain. An overactive thyroid gland (hyperthyroidism). Other sleep disorders, such as restless legs syndrome and sleep apnea. Menopause. Sometimes, the cause of insomnia may not be known. What increases the risk? Risk factors for insomnia include: Gender. Females are affected more often than males. Age. Insomnia is more common as people get older. Stress and certain medical and mental health conditions. Lack of exercise. Having an irregular work schedule. This may include working night shifts and traveling between different time zones. What are the signs or symptoms? If you have insomnia, the main symptom is having trouble falling asleep or having trouble staying asleep. This may lead to other symptoms, such as: Feeling tired or having low energy. Feeling nervous about going to sleep. Not feeling rested in the morning. Having trouble concentrating. Feeling irritable, anxious, or depressed. How is this diagnosed? This condition  may be diagnosed based on: Your symptoms and medical history. Your health care provider may ask about: Your sleep habits. Any medical conditions you have. Your mental health. A physical exam. How is this treated? Treatment for insomnia depends on the cause. Treatment may focus on treating an underlying condition that is causing the insomnia. Treatment may also include: Medicines to help you sleep. Counseling or therapy. Lifestyle adjustments to help you sleep better. Follow these instructions at home: Eating and drinking  Limit or avoid alcohol, caffeinated beverages, and products that contain nicotine and tobacco, especially close to bedtime. These can disrupt your sleep. Do not eat a large meal or eat spicy foods right before bedtime. This can lead to digestive discomfort that can make it hard for you to sleep. Sleep habits  Keep a sleep diary to help you and your health care provider figure out what could be causing your insomnia. Write down: When you sleep. When you wake up during the night. How well you sleep and how rested you feel the next day. Any side effects of medicines you are taking. What you eat and drink. Make your bedroom a dark, comfortable place where it is easy to fall asleep. Put up shades or blackout curtains to block light from outside. Use a white noise machine to block noise. Keep the temperature cool. Limit screen use before bedtime. This includes: Not watching TV. Not using your smartphone, tablet, or computer. Stick to a routine that includes going to bed and waking up at the same times every day and night. This can help you fall asleep faster. Consider making a quiet activity, such as reading, part of your nighttime routine. Try to avoid taking naps during the day so that you sleep better at night. Get out of bed if you are still awake after   15 minutes of trying to sleep. Keep the lights down, but try reading or doing a quiet activity. When you feel  sleepy, go back to bed. General instructions Take over-the-counter and prescription medicines only as told by your health care provider. Exercise regularly as told by your health care provider. However, avoid exercising in the hours right before bedtime. Use relaxation techniques to manage stress. Ask your health care provider to suggest some techniques that may work well for you. These may include: Breathing exercises. Routines to release muscle tension. Visualizing peaceful scenes. Make sure that you drive carefully. Do not drive if you feel very sleepy. Keep all follow-up visits. This is important. Contact a health care provider if: You are tired throughout the day. You have trouble in your daily routine due to sleepiness. You continue to have sleep problems, or your sleep problems get worse. Get help right away if: You have thoughts about hurting yourself or someone else. Get help right away if you feel like you may hurt yourself or others, or have thoughts about taking your own life. Go to your nearest emergency room or: Call 911. Call the National Suicide Prevention Lifeline at 1-800-273-8255 or 988. This is open 24 hours a day. Text the Crisis Text Line at 741741. Summary Insomnia is a sleep disorder that makes it difficult to fall asleep or stay asleep. Insomnia can be long-term (chronic) or short-term (acute). Treatment for insomnia depends on the cause. Treatment may focus on treating an underlying condition that is causing the insomnia. Keep a sleep diary to help you and your health care provider figure out what could be causing your insomnia. This information is not intended to replace advice given to you by your health care provider. Make sure you discuss any questions you have with your health care provider. Document Revised: 04/16/2021 Document Reviewed: 04/16/2021 Elsevier Patient Education  2023 [employer]  

## 2022-05-30 NOTE — Progress Notes (Signed)
Subjective:    Patient ID: Barbara Boyd, female    DOB: [birth year], 72 y.o.   MRN: [id number]   Chief Complaint: medical management of chronic issues     HPI:  Barbara Boyd is a 72 y.o. who identifies as a female who was assigned female at birth.   Social history: Lives with: husband Work history: retired   Scientist, forensic in today for follow up of the following chronic medical issues:  1. Essential hypertension No c/o chest pain, sob or headache. Does not check blood pressure at home. BP Readings from Last 3 Encounters:  11/26/21 130/73  07/25/21 128/63  05/28/21 131/70     2. Mixed hyperlipidemia Doe snot watch diet and does no dedicated exercise. Lab Results  Component Value Date   CHOL 149 11/26/2021   HDL 61 11/26/2021   LDLCALC 76 11/26/2021   TRIG 58 11/26/2021   CHOLHDL 2.4 11/26/2021     3. COPD with chronic bronchitis Is on Advair and spiriva daily. Says she thinks she is doing ok. Doe snot do inhalers regularly  4. Gastroesophageal reflux disease without esophagitis Is on omeprazole daily and is doing well  5. Graves disease No problems that she is aware of. Lab Results  Component Value Date   TSH 2.690 11/26/2021     6. Infiltrating ductal carcinoma of breast, stage 1, left (HCC) No reoccurence. Does not do mammograms because she had bil mastectomy  7. Urinary urgency Takes detrol LA daily. She says it helps keep her form having to go to the restroom so often.  8. Recurrent major depressive disorder, in full remission ([place]) Has been on lexapro for some time. Is doing well.  9. Vitamin D deficiency Is on weekly vitamin d supplement  10. Osteopenia of lumbar spine Last dexascan was done on 05/23/20. Her t score was -2.3  11. Smoker Has no desire to quit. Smokes over apack a day  12. BMI 30.0-30.9,adult No recent weight changes Wt Readings from Last 3 Encounters:  05/30/22 178 lb (80.7 kg)  11/26/21 176 lb (79.8 kg)  07/25/21 180 lb  (81.6 kg)   BMI Readings from Last 3 Encounters:  05/30/22 32.56 kg/m  11/26/21 32.19 kg/m  07/25/21 32.92 kg/m   12. Insomnia Is ambien to sleep- doe snot take every night. Wake sup frequently  New complaints: None today  Allergies  Allergen Reactions   Sulfa Antibiotics Hives   Aspirin Swelling and Rash    Swelling of face and throat   Penicillins Hives and Rash   Outpatient Encounter Medications as of 05/30/2022  Medication Sig   escitalopram (LEXAPRO) 20 MG tablet Take 1 tablet (20 mg total) by mouth daily.   fluticasone-salmeterol (ADVAIR DISKUS) 250-50 MCG/ACT AEPB Inhale 1 puff into the lungs in the morning and at bedtime.   hydrochlorothiazide (HYDRODIURIL) 25 MG tablet Take 1 tablet (25 mg total) by mouth daily.   MELATONIN PO Take by mouth.   meloxicam (MOBIC) 15 MG tablet TAKE 1 TABLET (15 MG TOTAL) BY MOUTH DAILY.   methimazole (TAPAZOLE) 5 MG tablet TAKE 1/2 TABLET BY MOUTH DAILY   omeprazole (PRILOSEC) 20 MG capsule Take 1 capsule (20 mg total) by mouth 2 (two) times daily before a meal.   simvastatin (ZOCOR) 40 MG tablet TAKE 1 TABLET BY MOUTH EVERY DAY   SUMAtriptan (IMITREX) 100 MG tablet TAKE 1 TABLET (100 MG TOTAL) AS NEEDED. MAY REPEAT IN 2 HOURS IF HEADACHE PERSISTS OR RECURS.   tiotropium (SPIRIVA  HANDIHALER) 18 MCG inhalation capsule Place 1 capsule (18 mcg total) into inhaler and inhale daily.   tolterodine (DETROL LA) 4 MG 24 hr capsule Take 1 capsule (4 mg total) by mouth daily.   triamcinolone ointment (KENALOG) 0.1 % APPLY TWICE A DAY FOR 2 WEEKS   Vitamin D, Ergocalciferol, (DRISDOL) 1.25 MG (50000 UNIT) CAPS capsule Take 1 capsule (50,000 Units total) by mouth every 7 (seven) days.   zolpidem (AMBIEN) 10 MG tablet Take 1 tablet (10 mg total) by mouth at bedtime as needed for sleep.   No facility-administered encounter medications on file as of 05/30/2022.    Past Surgical History:  Procedure Laterality Date   BILATERAL TOTAL MASTECTOMY WITH  AXILLARY LYMPH NODE DISSECTION Bilateral    BREAST RECONSTRUCTION     COLONOSCOPY     POLYPECTOMY      Family History  Problem Relation Age of Onset   Lung cancer Father    Arthritis Mother    Osteoporosis Mother    Dementia Mother    Breast cancer Maternal Aunt    Breast cancer Paternal Aunt    Breast cancer Maternal Aunt    Osteoporosis Maternal Grandmother    Breast cancer Maternal Grandmother    Colon cancer Neg Hx    Rectal cancer Neg Hx    Stomach cancer Neg Hx    Colon polyps Neg Hx    Heart disease Neg Hx       Controlled substance contract: n/a     Review of Systems  Constitutional:  Negative for diaphoresis.  Eyes:  Negative for pain.  Respiratory:  Negative for shortness of breath.   Cardiovascular:  Negative for chest pain, palpitations and leg swelling.  Gastrointestinal:  Negative for abdominal pain.  Endocrine: Negative for polydipsia.  Skin:  Negative for rash.  Neurological:  Negative for dizziness, weakness and headaches.  Hematological:  Does not bruise/bleed easily.  All other systems reviewed and are negative.      Objective:   Physical Exam Vitals and nursing note reviewed.  Constitutional:      General: She is not in acute distress.    Appearance: Normal appearance. She is well-developed.  HENT:     Head: Normocephalic.     Right Ear: Tympanic membrane normal.     Left Ear: Tympanic membrane normal.     Nose: Nose normal.     Mouth/Throat:     Mouth: Mucous membranes are moist.  Eyes:     Pupils: Pupils are equal, round, and reactive to light.  Neck:     Vascular: No carotid bruit or JVD.  Cardiovascular:     Rate and Rhythm: Normal rate and regular rhythm.     Heart sounds: Normal heart sounds.  Pulmonary:     Effort: Pulmonary effort is normal. No respiratory distress.     Breath sounds: Normal breath sounds. No wheezing or rales.  Chest:     Chest wall: No tenderness.  Abdominal:     General: Bowel sounds are normal.  There is no distension or abdominal bruit.     Palpations: Abdomen is soft. There is no hepatomegaly, splenomegaly, mass or pulsatile mass.     Tenderness: There is no abdominal tenderness.  Musculoskeletal:        General: Normal range of motion.     Cervical back: Normal range of motion and neck supple.     Right lower leg: Edema (1+) present.     Left lower leg: Edema (1+) present.  Lymphadenopathy:     Cervical: No cervical adenopathy.  Skin:    General: Skin is warm and dry.  Neurological:     Mental Status: She is alert and oriented to person, place, and time.     Deep Tendon Reflexes: Reflexes are normal and symmetric.  Psychiatric:        Behavior: Behavior normal.        Thought Content: Thought content normal.        Judgment: Judgment normal.     BP 137/80   Pulse 75   Temp (!) 97.5 F (36.4 C) (Temporal)   Resp 20   Ht '5\' 2"'$  (1.575 m)   Wt 178 lb (80.7 kg)   SpO2 95%   BMI 32.56 kg/m        Assessment & Plan:   Deanza Upperman Alderman comes in today with chief complaint of Medical Management of Chronic Issues   Diagnosis and orders addressed:  1. Essential hypertension Low sodium diet - hydrochlorothiazide (HYDRODIURIL) 25 MG tablet; Take 1 tablet (25 mg total) by mouth daily.  Dispense: 90 tablet; Refill: 1 - CBC with Differential/Platelet - CMP14+EGFR  2. Mixed hyperlipidemia Low fat diet - simvastatin (ZOCOR) 40 MG tablet; TAKE 1 TABLET BY MOUTH EVERY DAY  Dispense: 90 tablet; Refill: 1 - Lipid panel  3. COPD with chronic bronchitis Continue advair and spirivia- encouraged to use daily  4. Gastroesophageal reflux disease without esophagitis Avoid spicy foods Do not eat 2 hours prior to bedtime - omeprazole (PRILOSEC) 20 MG capsule; Take 1 capsule (20 mg total) by mouth 2 (two) times daily before a meal.  Dispense: 180 capsule; Refill: 1  5. Graves disease Lab soending - methimazole (TAPAZOLE) 5 MG tablet; TAKE 1/2 TABLET BY MOUTH DAILY  Dispense:  90 tablet; Refill: 1 - Thyroid Panel With TSH  6. Infiltrating ductal carcinoma of breast, stage 1, left ([place])  7. Urinary urgency - tolterodine (DETROL LA) 4 MG 24 hr capsule; Take 1 capsule (4 mg total) by mouth daily.  Dispense: 90 capsule; Refill: 1  8. Recurrent major depressive disorder, in full remission ([place]) Stress management - escitalopram (LEXAPRO) 20 MG tablet; Take 1 tablet (20 mg total) by mouth daily.  Dispense: 90 tablet; Refill: 1  9. Vitamin D deficiency Continue vitamin d suplement - Vitamin D, Ergocalciferol, (DRISDOL) 1.25 MG (50000 UNIT) CAPS capsule; Take 1 capsule (50,000 Units total) by mouth every 7 (seven) days.  Dispense: 12 capsule; Refill: 1  10. Osteopenia of lumbar spine Weight bearing exercises encouraged  11. Smoker Smoking cessation encouraged  12. BMI 30.0-30.9,adult Discussed diet and exercise for person with BMI >25 Will recheck weight in 3-6 months   13. Migraine without aura and with status migrainosus, not intractable - SUMAtriptan (IMITREX) 100 MG tablet; TAKE 1 TABLET (100 MG TOTAL) AS NEEDED. MAY REPEAT IN 2 HOURS IF HEADACHE PERSISTS OR RECURS.  Dispense: 9 tablet; Refill: 3  14. Primary insomnia Bedtime routine - zolpidem (AMBIEN) 10 MG tablet; Take 1 tablet (10 mg total) by mouth at bedtime as needed for sleep.  Dispense: 15 tablet; Refill: 1   Labs pending Health Maintenance reviewed Diet and exercise encouraged  Follow up plan: 6 months   [name], FNP

## 2022-05-31 ENCOUNTER — Other Ambulatory Visit: Payer: Self-pay

## 2022-05-31 DIAGNOSIS — Z122 Encounter for screening for malignant neoplasm of respiratory organs: Secondary | ICD-10-CM

## 2022-05-31 DIAGNOSIS — Z87891 Personal history of nicotine dependence: Secondary | ICD-10-CM

## 2022-05-31 LAB — CBC WITH DIFFERENTIAL/PLATELET
Basophils Absolute: 0.1 10*3/uL (ref 0.0–0.2)
Basos: 1 %
EOS (ABSOLUTE): 0.3 10*3/uL (ref 0.0–0.4)
Eos: 5 %
Hematocrit: 40.6 % (ref 34.0–46.6)
Hemoglobin: 14.1 g/dL (ref 11.1–15.9)
Immature Grans (Abs): 0 10*3/uL (ref 0.0–0.1)
Immature Granulocytes: 0 %
Lymphocytes Absolute: 1.9 10*3/uL (ref 0.7–3.1)
Lymphs: 29 %
MCH: 32.9 pg (ref 26.6–33.0)
MCHC: 34.7 g/dL (ref 31.5–35.7)
MCV: 95 fL (ref 79–97)
Monocytes Absolute: 0.7 10*3/uL (ref 0.1–0.9)
Monocytes: 11 %
Neutrophils Absolute: 3.5 10*3/uL (ref 1.4–7.0)
Neutrophils: 54 %
Platelets: 325 10*3/uL (ref 150–450)
RBC: 4.29 x10E6/uL (ref 3.77–5.28)
RDW: 12 % (ref 11.7–15.4)
WBC: 6.5 10*3/uL (ref 3.4–10.8)

## 2022-05-31 LAB — CMP14+EGFR
ALT: 15 IU/L (ref 0–32)
AST: 24 IU/L (ref 0–40)
Albumin/Globulin Ratio: 1.8 (ref 1.2–2.2)
Albumin: 4.2 g/dL (ref 3.8–4.8)
Alkaline Phosphatase: 78 IU/L (ref 44–121)
BUN/Creatinine Ratio: 21 (ref 12–28)
BUN: 18 mg/dL (ref 8–27)
Bilirubin Total: 0.3 mg/dL (ref 0.0–1.2)
CO2: 24 mmol/L (ref 20–29)
Calcium: 9.2 mg/dL (ref 8.7–10.3)
Chloride: 93 mmol/L — ABNORMAL LOW (ref 96–106)
Creatinine, Ser: 0.86 mg/dL (ref 0.57–1.00)
Globulin, Total: 2.4 g/dL (ref 1.5–4.5)
Glucose: 96 mg/dL (ref 70–99)
Potassium: 4.4 mmol/L (ref 3.5–5.2)
Sodium: 131 mmol/L — ABNORMAL LOW (ref 134–144)
Total Protein: 6.6 g/dL (ref 6.0–8.5)
eGFR: 72 mL/min/{1.73_m2} (ref >59)

## 2022-05-31 LAB — THYROID PANEL WITH TSH
Free Thyroxine Index: 2.5 (ref 1.2–4.9)
T3 Uptake Ratio: 28 % (ref 24–39)
T4, Total: 9 ug/dL (ref 4.5–12.0)
TSH: 2.35 u[IU]/mL (ref 0.450–4.500)

## 2022-05-31 LAB — LIPID PANEL
Chol/HDL Ratio: 2.4 ratio (ref 0.0–4.4)
Cholesterol, Total: 160 mg/dL (ref 100–199)
HDL: 67 mg/dL (ref >39)
LDL Chol Calc (NIH): 82 mg/dL (ref 0–99)
Triglycerides: 50 mg/dL (ref 0–149)
VLDL Cholesterol Cal: 11 mg/dL (ref 5–40)

## 2022-05-31 NOTE — Progress Notes (Signed)
LDCT order placed per protocol. Scan scheduled for 03/28 at 1245

## 2022-06-04 ENCOUNTER — Ambulatory Visit (INDEPENDENT_AMBULATORY_CARE_PROVIDER_SITE_OTHER): Payer: Medicare Other

## 2022-06-04 VITALS — Ht 62.0 in | Wt 178.0 lb

## 2022-06-04 DIAGNOSIS — Z0000 Encounter for general adult medical examination without abnormal findings: Secondary | ICD-10-CM

## 2022-06-04 NOTE — Progress Notes (Signed)
Subjective:   Barbara Boyd is a 72 y.o. female who presents for Medicare Annual (Subsequent) preventive examination. I connected with  Tierney A Skilton on 06/04/22 by a audio enabled telemedicine application and verified that I am speaking with the correct person using two identifiers.  Patient Location: Home  Provider Location: Home Office  I discussed the limitations of evaluation and management by telemedicine. The patient expressed understanding and agreed to proceed.  Review of Systems     Cardiac Risk Factors include: advanced age (>82mn, >>10women);hypertension     Objective:    Today's Vitals   06/04/22 1320  Weight: 178 lb (80.7 kg)  Height: '5\' 2"'$  (1.575 m)   Body mass index is 32.56 kg/m.     06/04/2022    1:23 PM 05/16/2021   10:50 AM 04/18/2014   10:35 AM  Advanced Directives  Does Patient Have a Medical Advance Directive? No No No  Would patient like information on creating a medical advance directive? No - Patient declined No - Patient declined     Current Medications (verified) Outpatient Encounter Medications as of 06/04/2022  Medication Sig   escitalopram (LEXAPRO) 20 MG tablet Take 1 tablet (20 mg total) by mouth daily.   fluticasone-salmeterol (ADVAIR DISKUS) 250-50 MCG/ACT AEPB Inhale 1 puff into the lungs in the morning and at bedtime.   hydrochlorothiazide (HYDRODIURIL) 25 MG tablet Take 1 tablet (25 mg total) by mouth daily.   meloxicam (MOBIC) 15 MG tablet TAKE 1 TABLET (15 MG TOTAL) BY MOUTH DAILY.   methimazole (TAPAZOLE) 5 MG tablet TAKE 1/2 TABLET BY MOUTH DAILY   omeprazole (PRILOSEC) 20 MG capsule Take 1 capsule (20 mg total) by mouth 2 (two) times daily before a meal.   simvastatin (ZOCOR) 40 MG tablet TAKE 1 TABLET BY MOUTH EVERY DAY   SUMAtriptan (IMITREX) 100 MG tablet TAKE 1 TABLET (100 MG TOTAL) AS NEEDED. MAY REPEAT IN 2 HOURS IF HEADACHE PERSISTS OR RECURS.   tiotropium (SPIRIVA HANDIHALER) 18 MCG inhalation capsule Place 1  capsule (18 mcg total) into inhaler and inhale daily.   tolterodine (DETROL LA) 4 MG 24 hr capsule Take 1 capsule (4 mg total) by mouth daily.   Vitamin D, Ergocalciferol, (DRISDOL) 1.25 MG (50000 UNIT) CAPS capsule Take 1 capsule (50,000 Units total) by mouth every 7 (seven) days.   zolpidem (AMBIEN) 10 MG tablet Take 1 tablet (10 mg total) by mouth at bedtime as needed for sleep.   No facility-administered encounter medications on file as of 06/04/2022.    Allergies (verified) Sulfa antibiotics, Aspirin, and Penicillins   History: Past Medical History:  Diagnosis Date   Arthritis    Asthma    Cancer ([facility] 09-2013   left Br. CA, had bilat mastectomy and doing reconstruction now   Cataract    removed both    COPD (chronic obstructive pulmonary disease) (HCC)    GERD (gastroesophageal reflux disease)    Hyperlipidemia    Osteopenia    borderline   Thyroid disease    Past Surgical History:  Procedure Laterality Date   BILATERAL TOTAL MASTECTOMY WITH AXILLARY LYMPH NODE DISSECTION Bilateral    BREAST RECONSTRUCTION     COLONOSCOPY     POLYPECTOMY     Family History  Problem Relation Age of Onset   Lung cancer Father    Arthritis Mother    Osteoporosis Mother    Dementia Mother    Breast cancer Maternal Aunt    Breast cancer Paternal Aunt  Breast cancer Maternal Aunt    Osteoporosis Maternal Grandmother    Breast cancer Maternal Grandmother    Colon cancer Neg Hx    Rectal cancer Neg Hx    Stomach cancer Neg Hx    Colon polyps Neg Hx    Heart disease Neg Hx    Social History   Socioeconomic History   Marital status: Divorced    Spouse name: Not on file   Number of children: 2   Years of education: Not on file   Highest education level: Not on file  Occupational History   Not on file  Tobacco Use   Smoking status: Every Day    Packs/day: 2.00    Types: Cigarettes    Start date: 01/28/1973   Smokeless tobacco: Never  Vaping Use   Vaping Use: Never used   Substance and Sexual Activity   Alcohol use: No    Alcohol/week: 0.0 standard drinks of alcohol   Drug use: No   Sexual activity: Not on file  Other Topics Concern   Not on file  Social History Narrative   1 son died in 08-01-20 at age 71. Had Muscular Dystrophy.   1 son living and lives close by.    1 grandson.   Social Determinants of Health   Financial Resource Strain: Low Risk  (06/04/2022)   Overall Financial Resource Strain (CARDIA)    Difficulty of Paying Living Expenses: Not hard at all  Food Insecurity: No Food Insecurity (06/04/2022)   Hunger Vital Sign    Worried About Running Out of Food in the Last Year: Never true    Ran Out of Food in the Last Year: Never true  Transportation Needs: No Transportation Needs (06/04/2022)   PRAPARE - Hydrologist (Medical): No    Lack of Transportation (Non-Medical): No  Physical Activity: Inactive (06/04/2022)   Exercise Vital Sign    Days of Exercise per Week: 0 days    Minutes of Exercise per Session: 0 min  Stress: No Stress Concern Present (06/04/2022)   [place]    Feeling of Stress : Not at all  Social Connections: Moderately Isolated (06/04/2022)   Social Connection and Isolation Panel [NHANES]    Frequency of Communication with Friends and Family: More than three times a week    Frequency of Social Gatherings with Friends and Family: More than three times a week    Attends Religious Services: More than 4 times per year    Active Member of [employer] or Organizations: No    Attends Music therapist: Never    Marital Status: Divorced    Tobacco Counseling Ready to quit: Not Answered Counseling given: Not Answered   Clinical Intake:  Pre-visit preparation completed: Yes  Pain : No/denies pain     Nutritional Risks: None Diabetes: No  How often do you need to have someone help you when you read  instructions, pamphlets, or other written materials from your doctor or pharmacy?: 1 - Never  Diabetic?no   Interpreter Needed?: No  Information entered by :: Jadene Pierini, LPN   Activities of Daily Living    06/04/2022    1:23 PM  In your present state of health, do you have any difficulty performing the following activities:  Hearing? 0  Vision? 0  Difficulty concentrating or making decisions? 0  Walking or climbing stairs? 0  Dressing or bathing? 0  Doing errands,  shopping? 0  Preparing Food and eating ? N  Using the Toilet? N  In the past six months, have you accidently leaked urine? N  Do you have problems with loss of bowel control? N  Managing your Medications? N  Managing your Finances? N  Housekeeping or managing your Housekeeping? N    Patient Care Team: Chevis Pretty, FNP as PCP - General (Family Medicine)  Indicate any recent Medical Services you may have received from other than [facility] providers in the past year (date may be approximate).     Assessment:   This is a routine wellness examination for Mee.  Hearing/Vision screen Vision Screening - Comments:: Wears rx glasses - up to date with routine eye exams with  Dr.Davis  Dietary issues and exercise activities discussed: Current Exercise Habits: The patient does not participate in regular exercise at present   Goals Addressed             This Visit's Progress    DIET - REDUCE CALORIE INTAKE   On track    Pt would like to lose weight       Depression Screen    06/04/2022    1:22 PM 05/30/2022   10:06 AM 11/26/2021   10:45 AM 07/25/2021   11:32 AM 05/28/2021   10:15 AM 05/16/2021   10:43 AM 11/23/2020   10:48 AM  PHQ 2/9 Scores  PHQ - 2 Score 0 0 0 0 0 1 0  PHQ- 9 Score 0 '2 4  2  2    '$ Fall Risk    06/04/2022    1:21 PM 05/30/2022   10:06 AM 11/26/2021   10:45 AM 07/25/2021   11:31 AM 05/28/2021   10:16 AM  Fall Risk   Falls in the past year? 0 0 0 1 1  Number falls in past yr: 0   0    Injury with Fall? 0   0   Risk for fall due to : No Fall Risks   History of fall(s)   Follow up Falls prevention discussed   Falls evaluation completed     [place]:  Any stairs in or around the home? Yes  If so, are there any without handrails? No  Home free of loose throw rugs in walkways, pet beds, electrical cords, etc? Yes  Adequate lighting in your home to reduce risk of falls? Yes   ASSISTIVE DEVICES UTILIZED TO PREVENT FALLS:  Life alert? No  Use of a cane, walker or w/c? No  Grab bars in the bathroom? Yes  Shower chair or bench in shower? No  Elevated toilet seat or a handicapped toilet? No          06/04/2022    1:23 PM  6CIT Screen  What Year? 0 points  What month? 0 points  What time? 0 points  Count back from 20 0 points  Months in reverse 0 points  Repeat phrase 0 points  Total Score 0 points    Immunizations Immunization History  Administered Date(s) Administered   Fluad Quad(high Dose 65+) 03/10/2019, 05/17/2020, 05/30/2022   Influenza, High Dose Seasonal PF 03/13/2016, 01/27/2018, 05/07/2021   Influenza,inj,Quad PF,6+ Mos 03/17/2015   Influenza-Unspecified 01/27/2018   Moderna SARS-COV2 Booster Vaccination 05/17/2020   Moderna Sars-Covid-2 Vaccination 07/22/2019, 08/27/2019   Pneumococcal Conjugate-13 07/24/2015   Pneumococcal Polysaccharide-23 08/26/2016   Tdap 04/27/2019   Zoster Recombinat (Shingrix) 05/07/2021   Zoster, Live 07/24/2015    TDAP status: Up  to date  Flu Vaccine status: Up to date  Pneumococcal vaccine status: Up to date  Covid-19 vaccine status: Completed vaccines  Qualifies for Shingles Vaccine? Yes   Zostavax completed Yes   Shingrix Completed?: Yes  Screening Tests Health Maintenance  Topic Date Due   COVID-19 Vaccine (3 - Moderna risk series) 06/15/2022 (Originally 06/14/2020)   Lung Cancer Screening  08/14/2022   Medicare Annual Wellness (AWV)  06/05/2023   COLONOSCOPY (Pts  45-1yr Insurance coverage will need to be confirmed)  01/12/2025   DTaP/Tdap/Td (2 - Td or Tdap) 04/26/2029   Pneumonia Vaccine 72 Years old  Completed   INFLUENZA VACCINE  Completed   DEXA SCAN  Completed   Hepatitis C Screening  Completed   Zoster Vaccines- Shingrix  Completed   HPV VACCINES  Aged Out    Health Maintenance  There are no preventive care reminders to display for this patient.   Colorectal cancer screening: Type of screening: Colonoscopy. Completed 01/13/2020. Repeat every 5 years  Mammogram status: No longer required due to Double mastectomy .  Bone Density status: Completed 05/23/2020. Results reflect: Bone density results: OSTEOPENIA. Repeat every 5 years.  Lung Cancer Screening: (Low Dose CT Chest recommended if Age 72-80years, 30 pack-year currently smoking OR have quit w/in 15years.) does qualify.   Lung Cancer Screening Referral: Scheduled 08/15/2022  Additional Screening:  Hepatitis C Screening: does not qualify; Completed 03/17/2015  Vision Screening: Recommended annual ophthalmology exams for early detection of glaucoma and other disorders of the eye. Is the patient up to date with their annual eye exam?  Yes  Who is the provider or what is the name of the office in which the patient attends annual eye exams? Dr.Davis  If pt is not established with a provider, would they like to be referred to a provider to establish care? No .   Dental Screening: Recommended annual dental exams for proper oral hygiene  Community Resource Referral / Chronic Care Management: CRR required this visit?  No   CCM required this visit?  No      Plan:     I have personally reviewed and noted the following in the patient's chart:   Medical and social history Use of alcohol, tobacco or illicit drugs  Current medications and supplements including opioid prescriptions. Patient is not currently taking opioid prescriptions. Functional ability and status Nutritional  status Physical activity Advanced directives List of other physicians Hospitalizations, surgeries, and ER visits in previous 12 months Vitals Screenings to include cognitive, depression, and falls Referrals and appointments  In addition, I have reviewed and discussed with patient certain preventive protocols, quality metrics, and best practice recommendations. A written personalized care plan for preventive services as well as general preventive health recommendations were provided to patient.     LDaphane Shepherd LPN   [id number]  Nurse Notes: Scheduled Lung Cancer screening 08/15/2022

## 2022-06-04 NOTE — Patient Instructions (Signed)
[name] , Thank you for taking time to come for your Medicare Wellness Visit. I appreciate your ongoing commitment to your health goals. Please review the following plan we discussed and let me know if I can assist you in the future.   These are the goals we discussed:  Goals      DIET - REDUCE CALORIE INTAKE     Pt would like to lose weight        This is a list of the screening recommended for you and due dates:  Health Maintenance  Topic Date Due   COVID-19 Vaccine (3 - Moderna risk series) 06/15/2022*   Screening for Lung Cancer  08/14/2022   Medicare Annual Wellness Visit  06/05/2023   Colon Cancer Screening  01/12/2025   DTaP/Tdap/Td vaccine (2 - Td or Tdap) 04/26/2029   Pneumonia Vaccine  Completed   Flu Shot  Completed   DEXA scan (bone density measurement)  Completed   Hepatitis C Screening: USPSTF Recommendation to screen - Ages 72-79 yo.  Completed   Zoster (Shingles) Vaccine  Completed   HPV Vaccine  Aged Out  *Topic was postponed. The date shown is not the original due date.    Advanced directives: Advance directive discussed with you today. I have provided a copy for you to complete at home and have notarized. Once this is complete please bring a copy in to our office so we can scan it into your chart.   Conditions/risks identified: Aim for 30 minutes of exercise or brisk walking, 6-8 glasses of water, and 5 servings of fruits and vegetables each day.   Next appointment: Follow up in one year for your annual wellness visit    Preventive Care 72 Years and Older, Female Preventive care refers to lifestyle choices and visits with your health care provider that can promote health and wellness. What does preventive care include? A yearly physical exam. This is also called an annual well check. Dental exams once or twice a year. Routine eye exams. Ask your health care provider how often you should have your eyes checked. Personal lifestyle choices,  including: Daily care of your teeth and gums. Regular physical activity. Eating a healthy diet. Avoiding tobacco and drug use. Limiting alcohol use. Practicing safe sex. Taking low-dose aspirin every day. Taking vitamin and mineral supplements as recommended by your health care provider. What happens during an annual well check? The services and screenings done by your health care provider during your annual well check will depend on your age, overall health, lifestyle risk factors, and family history of disease. Counseling  Your health care provider may ask you questions about your: Alcohol use. Tobacco use. Drug use. Emotional well-being. Home and relationship well-being. Sexual activity. Eating habits. History of falls. Memory and ability to understand (cognition). Work and work Statistician. Reproductive health. Screening  You may have the following tests or measurements: Height, weight, and BMI. Blood pressure. Lipid and cholesterol levels. These may be checked every 5 years, or more frequently if you are over 54 years old. Skin check. Lung cancer screening. You may have this screening every year starting at age 72 if you have a 30-pack-year history of smoking and currently smoke or have quit within the past 15 years. Fecal occult blood test (FOBT) of the stool. You may have this test every year starting at age 72. Flexible sigmoidoscopy or colonoscopy. You may have a sigmoidoscopy every 5 years or a colonoscopy every 10 years starting at age 72.  Hepatitis C blood test. Hepatitis B blood test. Sexually transmitted disease (STD) testing. Diabetes screening. This is done by checking your blood sugar (glucose) after you have not eaten for a while (fasting). You may have this done every 1-3 years. Bone density scan. This is done to screen for osteoporosis. You may have this done starting at age 72. Mammogram. This may be done every 1-2 years. Talk to your health care provider  about how often you should have regular mammograms. Talk with your health care provider about your test results, treatment options, and if necessary, the need for more tests. Vaccines  Your health care provider may recommend certain vaccines, such as: Influenza vaccine. This is recommended every year. Tetanus, diphtheria, and acellular pertussis (Tdap, Td) vaccine. You may need a Td booster every 10 years. Zoster vaccine. You may need this after age 80. Pneumococcal 13-valent conjugate (PCV13) vaccine. One dose is recommended after age 72. Pneumococcal polysaccharide (PPSV23) vaccine. One dose is recommended after age 5. Talk to your health care provider about which screenings and vaccines you need and how often you need them. This information is not intended to replace advice given to you by your health care provider. Make sure you discuss any questions you have with your health care provider. Document Released: 06/02/2015 Document Revised: 01/24/2016 Document Reviewed: 03/07/2015 Elsevier Interactive Patient Education  2017 [place] Prevention in the Home Falls can cause injuries. They can happen to people of all ages. There are many things you can do to make your home safe and to help prevent falls. What can I do on the outside of my home? Regularly fix the edges of walkways and driveways and fix any cracks. Remove anything that might make you trip as you walk through a door, such as a raised step or threshold. Trim any bushes or trees on the path to your home. Use bright outdoor lighting. Clear any walking paths of anything that might make someone trip, such as rocks or tools. Regularly check to see if handrails are loose or broken. Make sure that both sides of any steps have handrails. Any raised decks and porches should have guardrails on the edges. Have any leaves, snow, or ice cleared regularly. Use sand or salt on walking paths during winter. Clean up any spills in  your garage right away. This includes oil or grease spills. What can I do in the bathroom? Use night lights. Install grab bars by the toilet and in the tub and shower. Do not use towel bars as grab bars. Use non-skid mats or decals in the tub or shower. If you need to sit down in the shower, use a plastic, non-slip stool. Keep the floor dry. Clean up any water that spills on the floor as soon as it happens. Remove soap buildup in the tub or shower regularly. Attach bath mats securely with double-sided non-slip rug tape. Do not have throw rugs and other things on the floor that can make you trip. What can I do in the bedroom? Use night lights. Make sure that you have a light by your bed that is easy to reach. Do not use any sheets or blankets that are too big for your bed. They should not hang down onto the floor. Have a firm chair that has side arms. You can use this for support while you get dressed. Do not have throw rugs and other things on the floor that can make you trip. What can I do in  the kitchen? Clean up any spills right away. Avoid walking on wet floors. Keep items that you use a lot in easy-to-reach places. If you need to reach something above you, use a strong step stool that has a grab bar. Keep electrical cords out of the way. Do not use floor polish or wax that makes floors slippery. If you must use wax, use non-skid floor wax. Do not have throw rugs and other things on the floor that can make you trip. What can I do with my stairs? Do not leave any items on the stairs. Make sure that there are handrails on both sides of the stairs and use them. Fix handrails that are broken or loose. Make sure that handrails are as long as the stairways. Check any carpeting to make sure that it is firmly attached to the stairs. Fix any carpet that is loose or worn. Avoid having throw rugs at the top or bottom of the stairs. If you do have throw rugs, attach them to the floor with carpet  tape. Make sure that you have a light switch at the top of the stairs and the bottom of the stairs. If you do not have them, ask someone to add them for you. What else can I do to help prevent falls? Wear shoes that: Do not have high heels. Have rubber bottoms. Are comfortable and fit you well. Are closed at the toe. Do not wear sandals. If you use a stepladder: Make sure that it is fully opened. Do not climb a closed stepladder. Make sure that both sides of the stepladder are locked into place. Ask someone to hold it for you, if possible. Clearly mark and make sure that you can see: Any grab bars or handrails. First and last steps. Where the edge of each step is. Use tools that help you move around (mobility aids) if they are needed. These include: Canes. Walkers. Scooters. Crutches. Turn on the lights when you go into a dark area. Replace any light bulbs as soon as they burn out. Set up your furniture so you have a clear path. Avoid moving your furniture around. If any of your floors are uneven, fix them. If there are any pets around you, be aware of where they are. Review your medicines with your doctor. Some medicines can make you feel dizzy. This can increase your chance of falling. Ask your doctor what other things that you can do to help prevent falls. This information is not intended to replace advice given to you by your health care provider. Make sure you discuss any questions you have with your health care provider. Document Released: 03/02/2009 Document Revised: 10/12/2015 Document Reviewed: 06/10/2014 Elsevier Interactive Patient Education  2017 [employer].

## 2022-06-19 ENCOUNTER — Other Ambulatory Visit: Payer: Self-pay | Admitting: Nurse Practitioner

## 2022-08-15 ENCOUNTER — Ambulatory Visit (HOSPITAL_COMMUNITY)
Admission: RE | Admit: 2022-08-15 | Discharge: 2022-08-15 | Disposition: A | Discharge status: Home / Self Care | Payer: Medicare Other | Source: Ambulatory Visit | Attending: Physician Assistant | Admitting: Physician Assistant

## 2022-08-15 DIAGNOSIS — Z87891 Personal history of nicotine dependence: Secondary | ICD-10-CM | POA: Insufficient documentation

## 2022-08-15 DIAGNOSIS — Z122 Encounter for screening for malignant neoplasm of respiratory organs: Secondary | ICD-10-CM | POA: Insufficient documentation

## 2022-08-23 NOTE — Progress Notes (Signed)
Patient notified of LDCT Lung Cancer Screening Results via mail with the recommendation to follow-up in 12 months. Patient's referring provider has been sent a copy of results. Results are as follows: IMPRESSION: 1. Lung-RADS 2, benign appearance or behavior. Continue annual screening with low-dose chest CT without contrast in 12 months. 2. Aortic Atherosclerosis (ICD10-I70.0) and Emphysema (ICD10-J43.9). Coronary artery atherosclerosis.

## 2022-09-12 ENCOUNTER — Other Ambulatory Visit: Payer: Self-pay | Admitting: Nurse Practitioner

## 2022-11-28 ENCOUNTER — Encounter: Payer: Self-pay | Admitting: Nurse Practitioner

## 2022-11-28 ENCOUNTER — Ambulatory Visit: Payer: Medicare Other | Admitting: Nurse Practitioner

## 2022-11-28 VITALS — BP 132/82 | HR 110 | Temp 97.8°F | Resp 20 | Ht 62.0 in | Wt 178.0 lb

## 2022-11-28 DIAGNOSIS — E782 Mixed hyperlipidemia: Secondary | ICD-10-CM | POA: Diagnosis not present

## 2022-11-28 DIAGNOSIS — F3342 Major depressive disorder, recurrent, in full remission: Secondary | ICD-10-CM

## 2022-11-28 DIAGNOSIS — K219 Gastro-esophageal reflux disease without esophagitis: Secondary | ICD-10-CM

## 2022-11-28 DIAGNOSIS — F5101 Primary insomnia: Secondary | ICD-10-CM

## 2022-11-28 DIAGNOSIS — Z6830 Body mass index (BMI) 30.0-30.9, adult: Secondary | ICD-10-CM

## 2022-11-28 DIAGNOSIS — Z716 Tobacco abuse counseling: Secondary | ICD-10-CM

## 2022-11-28 DIAGNOSIS — J4489 Other specified chronic obstructive pulmonary disease: Secondary | ICD-10-CM

## 2022-11-28 DIAGNOSIS — E559 Vitamin D deficiency, unspecified: Secondary | ICD-10-CM

## 2022-11-28 DIAGNOSIS — R3915 Urgency of urination: Secondary | ICD-10-CM

## 2022-11-28 DIAGNOSIS — I10 Essential (primary) hypertension: Secondary | ICD-10-CM

## 2022-11-28 DIAGNOSIS — E0500 Thyrotoxicosis with diffuse goiter without thyrotoxic crisis or storm: Secondary | ICD-10-CM

## 2022-11-28 DIAGNOSIS — M8588 Other specified disorders of bone density and structure, other site: Secondary | ICD-10-CM

## 2022-11-28 MED ORDER — ZOLPIDEM TARTRATE 10 MG PO TABS
10.0000 mg | ORAL_TABLET | Freq: Every evening | ORAL | 1 refills | Status: DC | PRN
Start: 2022-11-28 — End: 2023-06-02

## 2022-11-28 MED ORDER — TOLTERODINE TARTRATE ER 4 MG PO CP24
4.0000 mg | ORAL_CAPSULE | Freq: Every day | ORAL | 1 refills | Status: DC
Start: 2022-11-28 — End: 2023-06-02

## 2022-11-28 MED ORDER — HYDROCHLOROTHIAZIDE 25 MG PO TABS: 25.0000 mg | ORAL_TABLET | Freq: Every day | ORAL | 1 refills | Status: DC

## 2022-11-28 MED ORDER — SIMVASTATIN 40 MG PO TABS
ORAL_TABLET | ORAL | 1 refills | Status: DC
Start: 2022-11-28 — End: 2023-06-02

## 2022-11-28 MED ORDER — TIOTROPIUM BROMIDE MONOHYDRATE 18 MCG IN CAPS: 18.0000 ug | ORAL_CAPSULE | Freq: Every day | RESPIRATORY_TRACT | 5 refills | Status: DC

## 2022-11-28 MED ORDER — ESCITALOPRAM OXALATE 20 MG PO TABS
20.0000 mg | ORAL_TABLET | Freq: Every day | ORAL | 1 refills | Status: DC
Start: 2022-11-28 — End: 2023-06-02

## 2022-11-28 MED ORDER — METHIMAZOLE 5 MG PO TABS: ORAL_TABLET | ORAL | 1 refills | Status: DC

## 2022-11-28 MED ORDER — OMEPRAZOLE 20 MG PO CPDR: 20.0000 mg | DELAYED_RELEASE_CAPSULE | Freq: Two times a day (BID) | ORAL | 1 refills | Status: DC

## 2022-11-28 MED ORDER — FLUTICASONE-SALMETEROL 250-50 MCG/ACT IN AEPB: 1.0000 | INHALATION_SPRAY | Freq: Two times a day (BID) | RESPIRATORY_TRACT | 1 refills | Status: DC

## 2022-11-28 NOTE — Patient Instructions (Signed)
Fall Prevention in the Home, Adult Falls can cause injuries and can happen to people of all ages. There are many things you can do to make your home safer and to help prevent falls. What actions can I take to prevent falls? General information Use good lighting in all rooms. Make sure to: Replace any light bulbs that burn out. Turn on the lights in dark areas and use night-lights. Keep items that you use often in easy-to-reach places. Lower the shelves around your home if needed. Move furniture so that there are clear paths around it. Do not use throw rugs or other things on the floor that can make you trip. If any of your floors are uneven, fix them. Add color or contrast paint or tape to clearly mark and help you see: Grab bars or handrails. First and last steps of staircases. Where the edge of each step is. If you use a ladder or stepladder: Make sure that it is fully opened. Do not climb a closed ladder. Make sure the sides of the ladder are locked in place. Have someone hold the ladder while you use it. Know where your pets are as you move through your home. What can I do in the bathroom?     Keep the floor dry. Clean up any water on the floor right away. Remove soap buildup in the bathtub or shower. Buildup makes bathtubs and showers slippery. Use non-skid mats or decals on the floor of the bathtub or shower. Attach bath mats securely with double-sided, non-slip rug tape. If you need to sit down in the shower, use a non-slip stool. Install grab bars by the toilet and in the bathtub and shower. Do not use towel bars as grab bars. What can I do in the bedroom? Make sure that you have a light by your bed that is easy to reach. Do not use any sheets or blankets on your bed that hang to the floor. Have a firm chair or bench with side arms that you can use for support when you get dressed. What can I do in the kitchen? Clean up any spills right away. If you need to reach something  above you, use a step stool with a grab bar. Keep electrical cords out of the way. Do not use floor polish or wax that makes floors slippery. What can I do with my stairs? Do not leave anything on the stairs. Make sure that you have a light switch at the top and the bottom of the stairs. Make sure that there are handrails on both sides of the stairs. Fix handrails that are broken or loose. Install non-slip stair treads on all your stairs if they do not have carpet. Avoid having throw rugs at the top or bottom of the stairs. Choose a carpet that does not hide the edge of the steps on the stairs. Make sure that the carpet is firmly attached to the stairs. Fix carpet that is loose or worn. What can I do on the outside of my home? Use bright outdoor lighting. Fix the edges of walkways and driveways and fix any cracks. Clear paths of anything that can make you trip, such as tools or rocks. Add color or contrast paint or tape to clearly mark and help you see anything that might make you trip as you walk through a door, such as a raised step or threshold. Trim any bushes or trees on paths to your home. Check to see if handrails are loose   or broken and that both sides of all steps have handrails. Install guardrails along the edges of any raised decks and porches. Have leaves, snow, or ice cleared regularly. Use sand, salt, or ice melter on paths if you live where there is ice and snow during the winter. Clean up any spills in your garage right away. This includes grease or oil spills. What other actions can I take? Review your medicines with your doctor. Some medicines can cause dizziness or changes in blood pressure, which increase your risk of falling. Wear shoes that: Have a low heel. Do not wear high heels. Have rubber bottoms and are closed at the toe. Feel good on your feet and fit well. Use tools that help you move around if needed. These include: Canes. Walkers. Scooters. Crutches. Ask  your doctor what else you can do to help prevent falls. This may include seeing a physical therapist to learn to do exercises to move better and get stronger. Where to find more information Centers for Disease Control and Prevention, [employer]: cdc.gov National Institute on Aging: nia.nih.gov National Institute on Aging: nia.nih.gov Contact a doctor if: You are afraid of falling at home. You feel weak, drowsy, or dizzy at home. You fall at home. Get help right away if you: Lose consciousness or have trouble moving after a fall. Have a fall that causes a head injury. These symptoms may be an emergency. Get help right away. Call 911. Do not wait to see if the symptoms will go away. Do not drive yourself to the hospital. This information is not intended to replace advice given to you by your health care provider. Make sure you discuss any questions you have with your health care provider. Document Revised: 01/07/2022 Document Reviewed: 01/07/2022 Elsevier Patient Education  2024 Elsevier Inc.  

## 2022-11-28 NOTE — Addendum Note (Signed)
Addended by: Cleda Daub on: 11/28/2022 12:00 PM   Modules accepted: Orders

## 2022-11-28 NOTE — Progress Notes (Signed)
Subjective:    Patient ID: Barbara Boyd, female    DOB: 1950-09-08, 72 y.o.   MRN: [id number]   Chief Complaint: medical management of chronic issues     HPI:  Barbara Boyd is a 72 y.o. who identifies as a female who was assigned female at birth.   Social history: Lives with: husband Work history: retired   Water engineer in today for follow up of the following chronic medical issues:  1. Mixed hyperlipidemia Does try to watch diet. Does no dedicated exercise. Lab Results  Component Value Date   CHOL 160 05/30/2022   HDL 67 05/30/2022   LDLCALC 82 05/30/2022   TRIG 50 05/30/2022   CHOLHDL 2.4 05/30/2022     2. Gastroesophageal reflux disease without esophagitis Is on omeprazole daily and is doing well.  3. COPD with chronic bronchitis Is on spirivia daily as well as advair. She wheezes a lot but refuses to quit smoking. Has not been using spirivia much lately  4. Graves disease No issues that she is aware of. Is ion tapazole daily Lab Results  Component Value Date   TSH 2.350 05/30/2022     5. Urinary urgency Is on detrol L A which helps some.  6. Tobacco abuse counseling No desire  to quit smoking. Smokes over 1 pack a day. Last low dose CT scan was done  on 08/15/22 and was clear  7. Vitamin D deficiency Is on weekly vitamin d supplement  8. Osteopenia of lumbar spine Last dexascan was done on   9. Recurrent major depressive disorder, in full remission (HCC) Has been on lexapro for several years. Says she is doing well.  10. Primary insomnia Is on ambien to sleep and is not able to sleep without taking.  11. BMI 30.0-30.9,adult No recent weight changes. Wt Readings from Last 3 Encounters:  11/28/22 178 lb (80.7 kg)  06/04/22 178 lb (80.7 kg)  05/30/22 178 lb (80.7 kg)   BMI Readings from Last 3 Encounters:  11/28/22 32.56 kg/m  06/04/22 32.56 kg/m  05/30/22 32.56 kg/m     New complaints: None today  Allergies  Allergen Reactions    Sulfa Antibiotics Hives   Aspirin Swelling and Rash    Swelling of face and throat   Penicillins Hives and Rash   Outpatient Encounter Medications as of 11/28/2022  Medication Sig   escitalopram (LEXAPRO) 20 MG tablet Take 1 tablet (20 mg total) by mouth daily.   fluticasone-salmeterol (ADVAIR DISKUS) 250-50 MCG/ACT AEPB Inhale 1 puff into the lungs in the morning and at bedtime.   hydrochlorothiazide (HYDRODIURIL) 25 MG tablet Take 1 tablet (25 mg total) by mouth daily.   meloxicam (MOBIC) 15 MG tablet TAKE 1 TABLET (15 MG TOTAL) BY MOUTH DAILY.   methimazole (TAPAZOLE) 5 MG tablet TAKE 1/2 TABLET BY MOUTH DAILY   omeprazole (PRILOSEC) 20 MG capsule Take 1 capsule (20 mg total) by mouth 2 (two) times daily before a meal.   simvastatin (ZOCOR) 40 MG tablet TAKE 1 TABLET BY MOUTH EVERY DAY   SUMAtriptan (IMITREX) 100 MG tablet TAKE 1 TABLET (100 MG TOTAL) AS NEEDED. MAY REPEAT IN 2 HOURS IF HEADACHE PERSISTS OR RECURS.   tiotropium (SPIRIVA HANDIHALER) 18 MCG inhalation capsule Place 1 capsule (18 mcg total) into inhaler and inhale daily.   tolterodine (DETROL LA) 4 MG 24 hr capsule Take 1 capsule (4 mg total) by mouth daily.   Vitamin D, Ergocalciferol, (DRISDOL) 1.25 MG (50000 UNIT) CAPS capsule Take  1 capsule (50,000 Units total) by mouth every 7 (seven) days.   zolpidem (AMBIEN) 10 MG tablet Take 1 tablet (10 mg total) by mouth at bedtime as needed for sleep.   No facility-administered encounter medications on file as of 11/28/2022.    Past Surgical History:  Procedure Laterality Date   BILATERAL TOTAL MASTECTOMY WITH AXILLARY LYMPH NODE DISSECTION Bilateral    BREAST RECONSTRUCTION     COLONOSCOPY     POLYPECTOMY      Family History  Problem Relation Age of Onset   Lung cancer Father    Arthritis Mother    Osteoporosis Mother    Dementia Mother    Breast cancer Maternal Aunt    Breast cancer Paternal Aunt    Breast cancer Maternal Aunt    Osteoporosis Maternal Grandmother     Breast cancer Maternal Grandmother    Colon cancer Neg Hx    Rectal cancer Neg Hx    Stomach cancer Neg Hx    Colon polyps Neg Hx    Heart disease Neg Hx       Controlled substance contract: 11/28/22- drug screen today     Review of Systems  Constitutional:  Negative for diaphoresis.  Eyes:  Negative for pain.  Respiratory:  Negative for shortness of breath.   Cardiovascular:  Negative for chest pain, palpitations and leg swelling.  Gastrointestinal:  Negative for abdominal pain.  Endocrine: Negative for polydipsia.  Skin:  Negative for rash.  Neurological:  Negative for dizziness, weakness and headaches.  Hematological:  Does not bruise/bleed easily.  All other systems reviewed and are negative.      Objective:   Physical Exam Vitals and nursing note reviewed.  Constitutional:      General: She is not in acute distress.    Appearance: Normal appearance. She is well-developed.  HENT:     Head: Normocephalic.     Right Ear: Tympanic membrane normal.     Left Ear: Tympanic membrane normal.     Nose: Nose normal.     Mouth/Throat:     Mouth: Mucous membranes are moist.  Eyes:     Pupils: Pupils are equal, round, and reactive to light.  Neck:     Vascular: No carotid bruit or JVD.  Cardiovascular:     Rate and Rhythm: Normal rate and regular rhythm.     Heart sounds: Normal heart sounds.  Pulmonary:     Effort: Pulmonary effort is normal. No respiratory distress.     Breath sounds: Normal breath sounds. No wheezing or rales.  Chest:     Chest wall: No tenderness.  Abdominal:     General: Bowel sounds are normal. There is no distension or abdominal bruit.     Palpations: Abdomen is soft. There is no hepatomegaly, splenomegaly, mass or pulsatile mass.     Tenderness: There is no abdominal tenderness.  Musculoskeletal:        General: Normal range of motion.     Cervical back: Normal range of motion and neck supple.  Lymphadenopathy:     Cervical: No cervical  adenopathy.  Skin:    General: Skin is warm and dry.  Neurological:     Mental Status: She is alert and oriented to person, place, and time.     Deep Tendon Reflexes: Reflexes are normal and symmetric.  Psychiatric:        Behavior: Behavior normal.        Thought Content: Thought content normal.  Judgment: Judgment normal.    BP 132/82   Pulse (!) 110   Temp 97.8 F (36.6 C) (Temporal)   Resp 20   Ht 5\' 2"  (1.575 m)   Wt 178 lb (80.7 kg)   SpO2 96%   BMI 32.56 kg/m           Assessment & Plan:  Barbara Boyd comes in today with chief complaint of Medical Management of Chronic Issues   Diagnosis and orders addressed:  1. Mixed hyperlipidemia Low fat diet - simvastatin (ZOCOR) 40 MG tablet; TAKE 1 TABLET BY MOUTH EVERY DAY  Dispense: 90 tablet; Refill: 1  2. Gastroesophageal reflux disease without esophagitis Avoid spicy foods Do not eat 2 hours prior to bedtime - omeprazole (PRILOSEC) 20 MG capsule; Take 1 capsule (20 mg total) by mouth 2 (two) times daily before a meal.  Dispense: 180 capsule; Refill: 1  3. COPD with chronic bronchitis Back on spirivia - fluticasone-salmeterol (ADVAIR DISKUS) 250-50 MCG/ACT AEPB; Inhale 1 puff into the lungs in the morning and at bedtime.  Dispense: 3 each; Refill: 1 - tiotropium (SPIRIVA HANDIHALER) 18 MCG inhalation capsule; Place 1 capsule (18 mcg total) into inhaler and inhale daily.  Dispense: 30 capsule; Refill: 5  4. Graves disease Labs pending - methimazole (TAPAZOLE) 5 MG tablet; TAKE 1/2 TABLET BY MOUTH DAILY  Dispense: 90 tablet; Refill: 1  5. Urinary urgency - tolterodine (DETROL LA) 4 MG 24 hr capsule; Take 1 capsule (4 mg total) by mouth daily.  Dispense: 90 capsule; Refill: 1  6. Tobacco abuse counseling Smoking cessation encouraged  7. Vitamin D deficiency Continue vitamin d supplement  8. Osteopenia of lumbar spine Weight bearing exercise  9. Recurrent major depressive disorder, in full  remission (HCC) Stress management - escitalopram (LEXAPRO) 20 MG tablet; Take 1 tablet (20 mg total) by mouth daily.  Dispense: 90 tablet; Refill: 1  10. Primary insomnia Bedtime routine - ToxASSURE Select 13 (MW), Urine - zolpidem (AMBIEN) 10 MG tablet; Take 1 tablet (10 mg total) by mouth at bedtime as needed for sleep.  Dispense: 15 tablet; Refill: 1  11. BMI 30.0-30.9,adult Discussed diet and exercise for person with BMI >25 Will recheck weight in 3-6 months   12. Essential hypertension Low sodium diet - hydrochlorothiazide (HYDRODIURIL) 25 MG tablet; Take 1 tablet (25 mg total) by mouth daily.  Dispense: 90 tablet; Refill: 1   Labs pending Health Maintenance reviewed Diet and exercise encouraged  Follow up plan: 6 months   Mary-Margaret Daphine Deutscher, FNP

## 2022-11-29 LAB — CMP14+EGFR
ALT: 13 IU/L (ref 0–32)
AST: 20 IU/L (ref 0–40)
Albumin: 4.1 g/dL (ref 3.8–4.8)
Alkaline Phosphatase: 79 IU/L (ref 44–121)
BUN/Creatinine Ratio: 16 (ref 12–28)
BUN: 12 mg/dL (ref 8–27)
Bilirubin Total: 0.3 mg/dL (ref 0.0–1.2)
CO2: 25 mmol/L (ref 20–29)
Calcium: 9.2 mg/dL (ref 8.7–10.3)
Chloride: 95 mmol/L — ABNORMAL LOW (ref 96–106)
Creatinine, Ser: 0.74 mg/dL (ref 0.57–1.00)
Globulin, Total: 2.1 g/dL (ref 1.5–4.5)
Glucose: 84 mg/dL (ref 70–99)
Potassium: 5.2 mmol/L (ref 3.5–5.2)
Sodium: 133 mmol/L — ABNORMAL LOW (ref 134–144)
Total Protein: 6.2 g/dL (ref 6.0–8.5)
eGFR: 86 mL/min/{1.73_m2} (ref >59)

## 2022-11-29 LAB — CBC WITH DIFFERENTIAL/PLATELET
Basophils Absolute: 0.1 10*3/uL (ref 0.0–0.2)
Basos: 1 %
EOS (ABSOLUTE): 0.3 10*3/uL (ref 0.0–0.4)
Eos: 6 %
Hematocrit: 39.9 % (ref 34.0–46.6)
Hemoglobin: 13.6 g/dL (ref 11.1–15.9)
Immature Grans (Abs): 0 10*3/uL (ref 0.0–0.1)
Immature Granulocytes: 0 %
Lymphocytes Absolute: 1.8 10*3/uL (ref 0.7–3.1)
Lymphs: 30 %
MCH: 33.7 pg — ABNORMAL HIGH (ref 26.6–33.0)
MCHC: 34.1 g/dL (ref 31.5–35.7)
MCV: 99 fL — ABNORMAL HIGH (ref 79–97)
Monocytes Absolute: 0.9 10*3/uL (ref 0.1–0.9)
Monocytes: 14 %
Neutrophils Absolute: 3 10*3/uL (ref 1.4–7.0)
Neutrophils: 49 %
Platelets: 308 10*3/uL (ref 150–450)
RBC: 4.04 x10E6/uL (ref 3.77–5.28)
RDW: 11.8 % (ref 11.7–15.4)
WBC: 6.1 10*3/uL (ref 3.4–10.8)

## 2022-11-29 LAB — LIPID PANEL
Chol/HDL Ratio: 2.6 ratio (ref 0.0–4.4)
Cholesterol, Total: 151 mg/dL (ref 100–199)
HDL: 57 mg/dL (ref >39)
LDL Chol Calc (NIH): 81 mg/dL (ref 0–99)
Triglycerides: 63 mg/dL (ref 0–149)
VLDL Cholesterol Cal: 13 mg/dL (ref 5–40)

## 2022-12-02 LAB — TOXASSURE SELECT 13 (MW), URINE

## 2022-12-09 ENCOUNTER — Other Ambulatory Visit: Payer: Self-pay | Admitting: Nurse Practitioner

## 2022-12-15 ENCOUNTER — Other Ambulatory Visit: Payer: Self-pay | Admitting: Nurse Practitioner

## 2022-12-15 DIAGNOSIS — E559 Vitamin D deficiency, unspecified: Secondary | ICD-10-CM

## 2022-12-24 ENCOUNTER — Ambulatory Visit: Payer: Medicare Other | Admitting: Family

## 2022-12-24 ENCOUNTER — Encounter: Payer: Self-pay | Admitting: Family

## 2022-12-24 ENCOUNTER — Ambulatory Visit (INDEPENDENT_AMBULATORY_CARE_PROVIDER_SITE_OTHER): Payer: Medicare Other

## 2022-12-24 VITALS — BP 134/74 | HR 84 | Temp 98.1°F | Ht 62.0 in | Wt 178.0 lb

## 2022-12-24 DIAGNOSIS — S300XXA Contusion of lower back and pelvis, initial encounter: Secondary | ICD-10-CM | POA: Diagnosis not present

## 2022-12-24 DIAGNOSIS — M545 Low back pain, unspecified: Secondary | ICD-10-CM | POA: Diagnosis not present

## 2022-12-24 DIAGNOSIS — W19XXXA Unspecified fall, initial encounter: Secondary | ICD-10-CM | POA: Diagnosis not present

## 2022-12-24 DIAGNOSIS — Y92009 Unspecified place in unspecified non-institutional (private) residence as the place of occurrence of the external cause: Secondary | ICD-10-CM

## 2022-12-24 MED ORDER — BACLOFEN 10 MG PO TABS: 10.0000 mg | ORAL_TABLET | Freq: Three times a day (TID) | ORAL | 0 refills | Status: DC

## 2022-12-24 NOTE — Patient Instructions (Signed)
Acute Back Pain, Adult Acute back pain is sudden and usually short-lived. It is often caused by an injury to the muscles and tissues in the back. The injury may result from: A muscle, tendon, or ligament getting overstretched or torn. Ligaments are tissues that connect bones to each other. Lifting something improperly can cause a back strain. Wear and tear (degeneration) of the spinal disks. Spinal disks are circular tissue that provide cushioning between the bones of the spine (vertebrae). Twisting motions, such as while playing sports or doing yard work. A hit to the back. Arthritis. You may have a physical exam, lab tests, and imaging tests to find the cause of your pain. Acute back pain usually goes away with rest and home care. Follow these instructions at home: Managing pain, stiffness, and swelling Take over-the-counter and prescription medicines only as told by your health care provider. Treatment may include medicines for pain and inflammation that are taken by mouth or applied to the skin, or muscle relaxants. Your health care provider may recommend applying ice during the first 24-48 hours after your pain starts. To do this: Put ice in a plastic bag. Place a towel between your skin and the bag. Leave the ice on for 20 minutes, 2-3 times a day. Remove the ice if your skin turns bright red. This is very important. If you cannot feel pain, heat, or cold, you have a greater risk of damage to the area. If directed, apply heat to the affected area as often as told by your health care provider. Use the heat source that your health care provider recommends, such as a moist heat pack or a heating pad. Place a towel between your skin and the heat source. Leave the heat on for 20-30 minutes. Remove the heat if your skin turns bright red. This is especially important if you are unable to feel pain, heat, or cold. You have a greater risk of getting burned. Activity  Do not stay in bed. Staying in  bed for more than 1-2 days can delay your recovery. Sit up and stand up straight. Avoid leaning forward when you sit or hunching over when you stand. If you work at a desk, sit close to it so you do not need to lean over. Keep your chin tucked in. Keep your neck drawn back, and keep your elbows bent at a 90-degree angle (right angle). Sit high and close to the steering wheel when you drive. Add lower back (lumbar) support to your car seat, if needed. Take short walks on even surfaces as soon as you are able. Try to increase the length of time you walk each day. Do not sit, drive, or stand in one place for more than 30 minutes at a time. Sitting or standing for long periods of time can put stress on your back. Do not drive or use heavy machinery while taking prescription pain medicine. Use proper lifting techniques. When you bend and lift, use positions that put less stress on your back: Bend your knees. Keep the load close to your body. Avoid twisting. Exercise regularly as told by your health care provider. Exercising helps your back heal faster and helps prevent back injuries by keeping muscles strong and flexible. Work with a physical therapist to make a safe exercise program, as recommended by your health care provider. Do any exercises as told by your physical therapist. Lifestyle Maintain a healthy weight. Extra weight puts stress on your back and makes it difficult to have good   posture. Avoid activities or situations that make you feel anxious or stressed. Stress and anxiety increase muscle tension and can make back pain worse. Learn ways to manage anxiety and stress, such as through exercise. General instructions Sleep on a firm mattress in a comfortable position. Try lying on your side with your knees slightly bent. If you lie on your back, put a pillow under your knees. Keep your head and neck in a straight line with your spine (neutral position) when using electronic equipment like  smartphones or pads. To do this: Raise your smartphone or pad to look at it instead of bending your head or neck to look down. Put the smartphone or pad at the level of your face while looking at the screen. Follow your treatment plan as told by your health care provider. This may include: Cognitive or behavioral therapy. Acupuncture or massage therapy. Meditation or yoga. Contact a health care provider if: You have pain that is not relieved with rest or medicine. You have increasing pain going down into your legs or buttocks. Your pain does not improve after 2 weeks. You have pain at night. You lose weight without trying. You have a fever or chills. You develop nausea or vomiting. You develop abdominal pain. Get help right away if: You develop new bowel or bladder control problems. You have unusual weakness or numbness in your arms or legs. You feel faint. These symptoms may represent a serious problem that is an emergency. Do not wait to see if the symptoms will go away. Get medical help right away. Call your local emergency services (911 in the [place] Do not drive yourself to the hospital. Summary Acute back pain is sudden and usually short-lived. Use proper lifting techniques. When you bend and lift, use positions that put less stress on your back. Take over-the-counter and prescription medicines only as told by your health care provider, and apply heat or ice as told. This information is not intended to replace advice given to you by your health care provider. Make sure you discuss any questions you have with your health care provider. Document Revised: 07/28/2020 Document Reviewed: 07/28/2020 Elsevier Patient Education  2024 Elsevier Inc.  

## 2022-12-24 NOTE — Progress Notes (Addendum)
Subjective:    Patient ID: Barbara Boyd, female    DOB: [birth year], 72 y.o.   MRN: [id number]  Chief Complaint  Patient presents with   Fall    Flat on back around 6 days ago last Wednesday.whole back wrose on left    PT presents to the office today for back pain after a fall 6 days ago. States she was walking and slipped and l landed on her lower back.  Fall She landed on Hard floor. The pain is present in the buttocks and back. The pain is at a severity of 10/10 (at certain positions). The pain is moderate. Pertinent negatives include no fever, headaches, hematuria, loss of consciousness, nausea, numbness, tingling or vomiting. She has tried rest and acetaminophen for the symptoms. The treatment provided mild relief.      Review of Systems  Constitutional:  Negative for fever.  Gastrointestinal:  Negative for nausea and vomiting.  Genitourinary:  Negative for hematuria.  Neurological:  Negative for tingling, loss of consciousness, numbness and headaches.  All other systems reviewed and are negative.      Objective:   Physical Exam Vitals reviewed.  Constitutional:      General: She is not in acute distress.    Appearance: She is well-developed.  HENT:     Head: Normocephalic and atraumatic.  Eyes:     Pupils: Pupils are equal, round, and reactive to light.  Neck:     Thyroid: No thyromegaly.  Cardiovascular:     Rate and Rhythm: Normal rate and regular rhythm.     Heart sounds: Normal heart sounds. No murmur heard. Pulmonary:     Effort: Pulmonary effort is normal. No respiratory distress.     Breath sounds: Wheezing and rhonchi present.  Abdominal:     General: Bowel sounds are normal. There is no distension.     Palpations: Abdomen is soft.     Tenderness: There is no abdominal tenderness.  Musculoskeletal:        General: Tenderness present. Normal range of motion.     Cervical back: Normal range of motion and neck supple.     Comments: Pain lower back pain,  ecchymosis   Skin:    General: Skin is warm and dry.     Findings: Bruising and ecchymosis present.       Neurological:     Mental Status: She is alert and oriented to person, place, and time.     Cranial Nerves: No cranial nerve deficit.     Deep Tendon Reflexes: Reflexes are normal and symmetric.  Psychiatric:        Behavior: Behavior normal.        Thought Content: Thought content normal.        Judgment: Judgment normal.      BP 134/74   Pulse 84   Temp 98.1 F (36.7 C) (Temporal)   Ht 5\' 2"  (1.575 m)   Wt 178 lb (80.7 kg)   SpO2 96%   BMI 32.56 kg/m       Assessment & Plan:  Barbara Boyd comes in today with chief complaint of Fall (Flat on back around 6 days ago last Wednesday.whole back wrose on left )   Diagnosis and orders addressed:  1. Acute bilateral low back pain without sciatica - DG Lumbar Spine 2-3 Views - baclofen (LIORESAL) 10 MG tablet; Take 1 tablet (10 mg total) by mouth 3 (three) times daily.  Dispense: 30 each; Refill: 0  2.  Fall in home, initial encounter - DG Lumbar Spine 2-3 Views   3. Contusion of lower back, initial encounter   Continue Mobic Baclofen as needed Rest Ice Keep follow up with PCP  Jannifer Rodney, FNP

## 2022-12-30 ENCOUNTER — Other Ambulatory Visit: Payer: Self-pay | Admitting: Family

## 2022-12-30 DIAGNOSIS — M545 Low back pain, unspecified: Secondary | ICD-10-CM

## 2022-12-30 DIAGNOSIS — M1990 Unspecified osteoarthritis, unspecified site: Secondary | ICD-10-CM

## 2023-01-07 ENCOUNTER — Ambulatory Visit: Payer: Medicare Other | Admitting: Orthopedic Surgery

## 2023-01-07 ENCOUNTER — Encounter: Payer: Self-pay | Admitting: Orthopedic Surgery

## 2023-01-07 VITALS — BP 143/83 | HR 84 | Ht 62.0 in | Wt 178.0 lb

## 2023-01-07 DIAGNOSIS — M545 Low back pain, unspecified: Secondary | ICD-10-CM | POA: Diagnosis not present

## 2023-01-07 MED ORDER — BACLOFEN 10 MG PO TABS: 10.0000 mg | ORAL_TABLET | Freq: Two times a day (BID) | ORAL | 0 refills | Status: DC | PRN

## 2023-01-07 NOTE — Progress Notes (Signed)
New Patient Visit  Assessment: Barbara Boyd is a 72 y.o. female with the following: 1. Acute left-sided low back pain without sciatica  Plan: Barbara Boyd fell, and landed on her backside a few weeks ago.  Gradual improvement in her pain.  Tenderness primarily in the left lower back, which is slowly resolving.  We reviewed radiographs in clinic today, which do not demonstrate an acute injury.  She has some signs of degenerative change.  She has very mild anterolisthesis at L4-5.  No concerning signs for nerve compression at this time.  Encouraged her to remain active.  Provided a refill of baclofen.  If she continues to have issues, I would recommend formal physical therapy.  She states her understanding.  She will return to clinic as needed.  Follow-up: Return if symptoms worsen or fail to improve.  Subjective:  Chief Complaint  Patient presents with   Back Pain    LBP after fall 12/18/22 not getting any better. No radiation down leg but L > R.     History of Present Illness: Barbara Boyd is a 72 y.o. female who has been referred by  Jannifer Rodney, FNP for evaluation of low back pain.  She fell, 2-3 weeks ago.  At that time, she had worse pain in the lower back.  The left is worse than the right.  She has no pain radiating into her legs.  No numbness or tingling.  She has been taking some baclofen, which is helping with her pain.  No prior injuries to her low back.  She does have some concerns about her posture changing, as she notes that her mother and grandmother had postural issues as they get older.   Review of Systems: No fevers or chills No numbness or tingling No chest pain No shortness of breath No bowel or bladder dysfunction No GI distress No headaches   Medical History:  Past Medical History:  Diagnosis Date   Arthritis    Asthma    Cancer (HCC) 09-2013   left Br. CA, had bilat mastectomy and doing reconstruction now   Cataract    removed both    COPD  (chronic obstructive pulmonary disease) (HCC)    GERD (gastroesophageal reflux disease)    Hyperlipidemia    Osteopenia    borderline   Thyroid disease     Past Surgical History:  Procedure Laterality Date   BILATERAL TOTAL MASTECTOMY WITH AXILLARY LYMPH NODE DISSECTION Bilateral    BREAST RECONSTRUCTION     COLONOSCOPY     POLYPECTOMY      Family History  Problem Relation Age of Onset   Lung cancer Father    Arthritis Mother    Osteoporosis Mother    Dementia Mother    Breast cancer Maternal Aunt    Breast cancer Paternal Aunt    Breast cancer Maternal Aunt    Osteoporosis Maternal Grandmother    Breast cancer Maternal Grandmother    Colon cancer Neg Hx    Rectal cancer Neg Hx    Stomach cancer Neg Hx    Colon polyps Neg Hx    Heart disease Neg Hx    Social History   Tobacco Use   Smoking status: Every Day    Current packs/day: 2.00    Average packs/day: 2.0 packs/day for 49.9 years (99.9 ttl pk-yrs)    Types: Cigarettes    Start date: 01/28/1973   Smokeless tobacco: Never  Vaping Use   Vaping status: Never Used  Substance Use Topics   Alcohol use: No    Alcohol/week: 0.0 standard drinks of alcohol   Drug use: No    Allergies  Allergen Reactions   Sulfa Antibiotics Hives   Aspirin Swelling and Rash    Swelling of face and throat   Penicillins Hives and Rash    Current Meds  Medication Sig   baclofen (LIORESAL) 10 MG tablet Take 1 tablet (10 mg total) by mouth every 12 (twelve) hours as needed for muscle spasms.    Objective: BP (!) 143/83   Pulse 84   Ht 5\' 2"  (1.575 m)   Wt 178 lb (80.7 kg)   BMI 32.56 kg/m   Physical Exam:  General: Elderly female., Alert and oriented., and No acute distress. Gait: Slow, steady gait.  Low back without deformity.  Tenderness to palpation over the left side, ranging into her flank.  Negative straight leg raise bilaterally.  She has excellent lower body strength.  No numbness or tingling.  Sensation intact  throughout bilateral lower extremities.  1+ patellar tendon reflexes bilaterally.  IMAGING: I personally reviewed images previously obtained in clinic  Lumbar spine x-rays were previously obtained.  No acute injuries.  Well-maintained disc height.  There are some associated osteophytes.  Mild, less than 25% anterolisthesis at L4-5.   New Medications:  Meds ordered this encounter  Medications   baclofen (LIORESAL) 10 MG tablet    Sig: Take 1 tablet (10 mg total) by mouth every 12 (twelve) hours as needed for muscle spasms.    Dispense:  30 each    Refill:  0      Oliver Barre, MD  01/07/2023 2:57 PM

## 2023-01-17 ENCOUNTER — Other Ambulatory Visit: Payer: Self-pay | Admitting: Orthopedic Surgery

## 2023-03-08 ENCOUNTER — Other Ambulatory Visit: Payer: Self-pay | Admitting: Nurse Practitioner

## 2023-06-02 ENCOUNTER — Encounter: Payer: Self-pay | Admitting: Nurse Practitioner

## 2023-06-02 ENCOUNTER — Ambulatory Visit (INDEPENDENT_AMBULATORY_CARE_PROVIDER_SITE_OTHER): Payer: PPO | Admitting: Nurse Practitioner

## 2023-06-02 VITALS — BP 110/70 | HR 76 | Temp 97.9°F | Resp 20 | Ht 62.0 in | Wt 178.0 lb

## 2023-06-02 DIAGNOSIS — I10 Essential (primary) hypertension: Secondary | ICD-10-CM | POA: Diagnosis not present

## 2023-06-02 DIAGNOSIS — Z8249 Family history of ischemic heart disease and other diseases of the circulatory system: Secondary | ICD-10-CM

## 2023-06-02 DIAGNOSIS — R3915 Urgency of urination: Secondary | ICD-10-CM | POA: Diagnosis not present

## 2023-06-02 DIAGNOSIS — E559 Vitamin D deficiency, unspecified: Secondary | ICD-10-CM

## 2023-06-02 DIAGNOSIS — Z716 Tobacco abuse counseling: Secondary | ICD-10-CM | POA: Diagnosis not present

## 2023-06-02 DIAGNOSIS — Z6830 Body mass index (BMI) 30.0-30.9, adult: Secondary | ICD-10-CM | POA: Diagnosis not present

## 2023-06-02 DIAGNOSIS — Z23 Encounter for immunization: Secondary | ICD-10-CM | POA: Diagnosis not present

## 2023-06-02 DIAGNOSIS — K219 Gastro-esophageal reflux disease without esophagitis: Secondary | ICD-10-CM

## 2023-06-02 DIAGNOSIS — E782 Mixed hyperlipidemia: Secondary | ICD-10-CM | POA: Diagnosis not present

## 2023-06-02 DIAGNOSIS — E0500 Thyrotoxicosis with diffuse goiter without thyrotoxic crisis or storm: Secondary | ICD-10-CM | POA: Diagnosis not present

## 2023-06-02 DIAGNOSIS — F3342 Major depressive disorder, recurrent, in full remission: Secondary | ICD-10-CM | POA: Diagnosis not present

## 2023-06-02 DIAGNOSIS — J4489 Other specified chronic obstructive pulmonary disease: Secondary | ICD-10-CM | POA: Diagnosis not present

## 2023-06-02 DIAGNOSIS — M8588 Other specified disorders of bone density and structure, other site: Secondary | ICD-10-CM

## 2023-06-02 DIAGNOSIS — F5101 Primary insomnia: Secondary | ICD-10-CM

## 2023-06-02 LAB — LIPID PANEL

## 2023-06-02 MED ORDER — METHIMAZOLE 5 MG PO TABS
ORAL_TABLET | ORAL | 1 refills | Status: DC
Start: 2023-06-02 — End: 2023-11-28

## 2023-06-02 MED ORDER — ZOLPIDEM TARTRATE 10 MG PO TABS: 10.0000 mg | ORAL_TABLET | Freq: Every evening | ORAL | 5 refills | Status: DC | PRN

## 2023-06-02 MED ORDER — SIMVASTATIN 40 MG PO TABS
ORAL_TABLET | ORAL | 1 refills | Status: DC
Start: 2023-06-02 — End: 2023-11-28

## 2023-06-02 MED ORDER — TIOTROPIUM BROMIDE MONOHYDRATE 18 MCG IN CAPS
18.0000 ug | ORAL_CAPSULE | Freq: Every day | RESPIRATORY_TRACT | 5 refills | Status: DC
Start: 2023-06-02 — End: 2023-11-28

## 2023-06-02 MED ORDER — ESCITALOPRAM OXALATE 20 MG PO TABS: 20.0000 mg | ORAL_TABLET | Freq: Every day | ORAL | 1 refills | Status: DC

## 2023-06-02 MED ORDER — OMEPRAZOLE 20 MG PO CPDR
20.0000 mg | DELAYED_RELEASE_CAPSULE | Freq: Two times a day (BID) | ORAL | 1 refills | Status: DC
Start: 2023-06-02 — End: 2023-11-24

## 2023-06-02 MED ORDER — FLUTICASONE-SALMETEROL 250-50 MCG/ACT IN AEPB
1.0000 | INHALATION_SPRAY | Freq: Two times a day (BID) | RESPIRATORY_TRACT | 5 refills | Status: DC
Start: 2023-06-02 — End: 2023-11-28

## 2023-06-02 MED ORDER — TOLTERODINE TARTRATE ER 4 MG PO CP24: 4.0000 mg | ORAL_CAPSULE | Freq: Every day | ORAL | 1 refills | Status: DC

## 2023-06-02 MED ORDER — HYDROCHLOROTHIAZIDE 25 MG PO TABS
25.0000 mg | ORAL_TABLET | Freq: Every day | ORAL | 1 refills | Status: DC
Start: 2023-06-02 — End: 2023-11-28

## 2023-06-02 NOTE — Addendum Note (Signed)
 Addended by: Bennie Pierini on: 06/02/2023 12:06 PM   Modules accepted: Level of Service

## 2023-06-02 NOTE — Progress Notes (Signed)
 Subjective:    Patient ID: Barbara Boyd, female    DOB: [birth year], 73 y.o.   MRN: [id number]   Chief Complaint: medical management of chronic issues     HPI:  Barbara Boyd is a 73 y.o. who identifies as a female who was assigned female at birth.   Social history: Lives with: husband Work history: retired   Water Engineer in today for follow up of the following chronic medical issues:  1. Primary hypertension No c/o chest pain, sob or headache. Does not check blood pressure at home. BP Readings from Last 3 Encounters:  01/07/23 (!) 143/83  12/24/22 134/74  11/28/22 132/82      2. Mixed hyperlipidemia Does try to watch diet. Does no dedicated exercise. Lab Results  Component Value Date   CHOL 151 11/28/2022   HDL 57 11/28/2022   LDLCALC 81 11/28/2022   TRIG 63 11/28/2022   CHOLHDL 2.6 11/28/2022   The 10-year ASCVD risk score (Arnett DK, et al., 2019) is: 16.7%    3. Gastroesophageal reflux disease without esophagitis Is on omeprazole  daily and is doing well.  4. COPD with chronic bronchitis Is on spirivia daily as well as advair. She wheezes a lot but refuses to quit smoking. Has not been using spirivia much lately  5. Graves disease No issues that she is aware of. Is ion tapazole  daily Lab Results  Component Value Date   TSH 2.350 05/30/2022     6. Urinary urgency Is on detrol  L A which helps some.  7. Tobacco abuse counseling No desire  to quit smoking. Smokes over 1 pack a day. Last low dose CT scan was done  on 08/15/22 and was clear  8. Vitamin D  deficiency Is on weekly vitamin d  supplement  9. Osteopenia of lumbar spine Last dexascan was done on   10. Recurrent major depressive disorder, in full remission (HCC) Has been on lexapro  for several years. Says she is doing well.    06/02/2023   11:42 AM 12/24/2022    8:01 AM 11/28/2022   11:22 AM  Depression screen PHQ 2/9  Decreased Interest 0 0 0  Down, Depressed, Hopeless 0 0 0  PHQ - 2 Score  0 0 0  Altered sleeping  1 1  Tired, decreased energy  1 1  Change in appetite  1 0  Feeling bad or failure about yourself   0 1  Trouble concentrating  0 0  Moving slowly or fidgety/restless  0 0  Suicidal thoughts  0 0  PHQ-9 Score  3 3  Difficult doing work/chores  Somewhat difficult Somewhat difficult    11. Primary insomnia Is on ambien  to sleep and is not able to sleep without taking.  12. BMI 30.0-30.9,adult No recent weight changes.  Wt Readings from Last 3 Encounters:  06/02/23 178 lb (80.7 kg)  01/07/23 178 lb (80.7 kg)  12/24/22 178 lb (80.7 kg)   BMI Readings from Last 3 Encounters:  06/02/23 32.56 kg/m  01/07/23 32.56 kg/m  12/24/22 32.56 kg/m      New complaints: Family history of cardiac issues. Would like to see cardiology. Has sob and sleeps all the time.  Allergies  Allergen Reactions   Sulfa Antibiotics Hives   Aspirin Swelling and Rash    Swelling of face and throat   Penicillins Hives and Rash   Outpatient Encounter Medications as of 06/02/2023  Medication Sig   baclofen  (LIORESAL ) 10 MG tablet TAKE 1 TABLET (10 MG TOTAL)  BY MOUTH EVERY 12 (TWELVE) HOURS AS NEEDED FOR MUSCLE SPASMS.   escitalopram  (LEXAPRO ) 20 MG tablet Take 1 tablet (20 mg total) by mouth daily.   fluticasone -salmeterol (ADVAIR DISKUS) 250-50 MCG/ACT AEPB Inhale 1 puff into the lungs in the morning and at bedtime.   hydrochlorothiazide  (HYDRODIURIL ) 25 MG tablet Take 1 tablet (25 mg total) by mouth daily.   meloxicam  (MOBIC ) 15 MG tablet TAKE 1 TABLET (15 MG TOTAL) BY MOUTH DAILY.   methimazole  (TAPAZOLE ) 5 MG tablet TAKE 1/2 TABLET BY MOUTH DAILY   omeprazole  (PRILOSEC) 20 MG capsule Take 1 capsule (20 mg total) by mouth 2 (two) times daily before a meal.   simvastatin  (ZOCOR ) 40 MG tablet TAKE 1 TABLET BY MOUTH EVERY DAY   SUMAtriptan  (IMITREX ) 100 MG tablet TAKE 1 TABLET (100 MG TOTAL) AS NEEDED. MAY REPEAT IN 2 HOURS IF HEADACHE PERSISTS OR RECURS.   tiotropium (SPIRIVA   HANDIHALER) 18 MCG inhalation capsule Place 1 capsule (18 mcg total) into inhaler and inhale daily.   tolterodine  (DETROL  LA) 4 MG 24 hr capsule Take 1 capsule (4 mg total) by mouth daily.   Vitamin D , Ergocalciferol , (DRISDOL ) 1.25 MG (50000 UNIT) CAPS capsule TAKE 1 CAPSULE (50,000 UNITS TOTAL) BY MOUTH EVERY 7 (SEVEN) DAYS   zolpidem  (AMBIEN ) 10 MG tablet Take 1 tablet (10 mg total) by mouth at bedtime as needed for sleep.   No facility-administered encounter medications on file as of 06/02/2023.    Past Surgical History:  Procedure Laterality Date   BILATERAL TOTAL MASTECTOMY WITH AXILLARY LYMPH NODE DISSECTION Bilateral    BREAST RECONSTRUCTION     COLONOSCOPY     POLYPECTOMY      Family History  Problem Relation Age of Onset   Lung cancer Father    Arthritis Mother    Osteoporosis Mother    Dementia Mother    Breast cancer Maternal Aunt    Breast cancer Paternal Aunt    Breast cancer Maternal Aunt    Osteoporosis Maternal Grandmother    Breast cancer Maternal Grandmother    Colon cancer Neg Hx    Rectal cancer Neg Hx    Stomach cancer Neg Hx    Colon polyps Neg Hx    Heart disease Neg Hx       Controlled substance contract: 11/28/22- drug screen today     Review of Systems  Constitutional:  Negative for diaphoresis.  Eyes:  Negative for pain.  Respiratory:  Negative for shortness of breath.   Cardiovascular:  Negative for chest pain, palpitations and leg swelling.  Gastrointestinal:  Negative for abdominal pain.  Endocrine: Negative for polydipsia.  Skin:  Negative for rash.  Neurological:  Negative for dizziness, weakness and headaches.  Hematological:  Does not bruise/bleed easily.  All other systems reviewed and are negative.      Objective:   Physical Exam Vitals and nursing note reviewed.  Constitutional:      General: She is not in acute distress.    Appearance: Normal appearance. She is well-developed.  HENT:     Head: Normocephalic.     Right  Ear: Tympanic membrane normal.     Left Ear: Tympanic membrane normal.     Nose: Nose normal.     Mouth/Throat:     Mouth: Mucous membranes are moist.  Eyes:     Pupils: Pupils are equal, round, and reactive to light.  Neck:     Vascular: No carotid bruit or JVD.  Cardiovascular:  Rate and Rhythm: Normal rate and regular rhythm.     Heart sounds: Normal heart sounds.  Pulmonary:     Effort: Pulmonary effort is normal. No respiratory distress.     Breath sounds: Rhonchi (that clear with coughing) present. No wheezing or rales.  Chest:     Chest wall: No tenderness.  Abdominal:     General: Bowel sounds are normal. There is no distension or abdominal bruit.     Palpations: Abdomen is soft. There is no hepatomegaly, splenomegaly, mass or pulsatile mass.     Tenderness: There is no abdominal tenderness.  Musculoskeletal:        General: Normal range of motion.     Cervical back: Normal range of motion and neck supple.  Lymphadenopathy:     Cervical: No cervical adenopathy.  Skin:    General: Skin is warm and dry.  Neurological:     Mental Status: She is alert and oriented to person, place, and time.     Deep Tendon Reflexes: Reflexes are normal and symmetric.  Psychiatric:        Behavior: Behavior normal.        Thought Content: Thought content normal.        Judgment: Judgment normal.    BP 110/70   Pulse 76   Temp 97.9 F (36.6 C) (Temporal)   Resp 20   Ht 5' 2 (1.575 m)   Wt 178 lb (80.7 kg)   SpO2 95%   BMI 32.56 kg/m        Assessment & Plan:  Fujiko Picazo Killings comes in today with chief complaint of Medical Management of Chronic Issues   Diagnosis and orders addressed:  1. Essential hypertension (Primary) Low sodium diet - hydrochlorothiazide  (HYDRODIURIL ) 25 MG tablet; Take 1 tablet (25 mg total) by mouth daily.  Dispense: 90 tablet; Refill: 1 - CBC with Differential/Platelet - CMP14+EGFR  2. Mixed hyperlipidemia Low fat diet - simvastatin  (ZOCOR )  40 MG tablet; TAKE 1 TABLET BY MOUTH EVERY DAY  Dispense: 90 tablet; Refill: 1 - Lipid panel  3. Gastroesophageal reflux disease without esophagitis Avoid spicy foods Do not eat 2 hours prior to bedtime - omeprazole  (PRILOSEC) 20 MG capsule; Take 1 capsule (20 mg total) by mouth 2 (two) times daily before a meal.  Dispense: 180 capsule; Refill: 1  4. COPD with chronic bronchitis (HCC) Smoking cessation - fluticasone -salmeterol (ADVAIR DISKUS) 250-50 MCG/ACT AEPB; Inhale 1 puff into the lungs in the morning and at bedtime.  Dispense: 3 each; Refill: 5 - tiotropium (SPIRIVA  HANDIHALER) 18 MCG inhalation capsule; Place 1 capsule (18 mcg total) into inhaler and inhale daily.  Dispense: 30 capsule; Refill: 5  5. Graves disease Labs pending - methimazole  (TAPAZOLE ) 5 MG tablet; TAKE 1/2 TABLET BY MOUTH DAILY  Dispense: 90 tablet; Refill: 1 - Thyroid  Panel With TSH  6. Urinary urgency - tolterodine  (DETROL  LA) 4 MG 24 hr capsule; Take 1 capsule (4 mg total) by mouth daily.  Dispense: 90 capsule; Refill: 1  7. Tobacco abuse counseling Smoking cessation encuraged  8. Vitamin D  deficiency Continue vitamin d  supplement  9. Osteopenia of lumbar spine Weight bearing exercises  10. Recurrent major depressive disorder, in full remission (HCC) Stress management - escitalopram  (LEXAPRO ) 20 MG tablet; Take 1 tablet (20 mg total) by mouth daily.  Dispense: 90 tablet; Refill: 1  11. Primary insomnia Bedtime routine - zolpidem  (AMBIEN ) 10 MG tablet; Take 1 tablet (10 mg total) by mouth at bedtime as needed for  sleep.  Dispense: 30 tablet; Refill: 5  12. BMI 30.0-30.9,adult Discussed diet and exercise for person with BMI >25 Will recheck weight in 3-6 months   13. Family history of heart disease - Ambulatory referral to Cardiology   Labs pending Health Maintenance reviewed Diet and exercise encouraged  Follow up plan: 6 months   Mary-Margaret Gladis, FNP

## 2023-06-02 NOTE — Patient Instructions (Signed)

## 2023-06-03 LAB — CMP14+EGFR
ALT: 12 IU/L (ref 0–32)
AST: 21 IU/L (ref 0–40)
Albumin: 4.2 g/dL (ref 3.8–4.8)
Alkaline Phosphatase: 92 IU/L (ref 44–121)
BUN/Creatinine Ratio: 19 (ref 12–28)
BUN: 13 mg/dL (ref 8–27)
Bilirubin Total: 0.3 mg/dL (ref 0.0–1.2)
CO2: 23 mmol/L (ref 20–29)
Calcium: 9 mg/dL (ref 8.7–10.3)
Chloride: 91 mmol/L — ABNORMAL LOW (ref 96–106)
Creatinine, Ser: 0.68 mg/dL (ref 0.57–1.00)
Globulin, Total: 2.2 g/dL (ref 1.5–4.5)
Glucose: 90 mg/dL (ref 70–99)
Potassium: 4.3 mmol/L (ref 3.5–5.2)
Sodium: 130 mmol/L — ABNORMAL LOW (ref 134–144)
Total Protein: 6.4 g/dL (ref 6.0–8.5)
eGFR: 92 mL/min/{1.73_m2} (ref >59)

## 2023-06-03 LAB — CBC WITH DIFFERENTIAL/PLATELET
Basophils Absolute: 0.1 10*3/uL (ref 0.0–0.2)
Basos: 1 %
EOS (ABSOLUTE): 0.3 10*3/uL (ref 0.0–0.4)
Eos: 4 %
Hematocrit: 41.4 % (ref 34.0–46.6)
Hemoglobin: 14.1 g/dL (ref 11.1–15.9)
Immature Grans (Abs): 0 10*3/uL (ref 0.0–0.1)
Immature Granulocytes: 0 %
Lymphocytes Absolute: 1.8 10*3/uL (ref 0.7–3.1)
Lymphs: 26 %
MCH: 33.3 pg — ABNORMAL HIGH (ref 26.6–33.0)
MCHC: 34.1 g/dL (ref 31.5–35.7)
MCV: 98 fL — ABNORMAL HIGH (ref 79–97)
Monocytes Absolute: 0.8 10*3/uL (ref 0.1–0.9)
Monocytes: 11 %
Neutrophils Absolute: 3.9 10*3/uL (ref 1.4–7.0)
Neutrophils: 58 %
Platelets: 312 10*3/uL (ref 150–450)
RBC: 4.23 x10E6/uL (ref 3.77–5.28)
RDW: 11.8 % (ref 11.7–15.4)
WBC: 6.7 10*3/uL (ref 3.4–10.8)

## 2023-06-03 LAB — LIPID PANEL
Cholesterol, Total: 150 mg/dL (ref 100–199)
HDL: 59 mg/dL (ref >39)
LDL CALC COMMENT:: 2.5 ratio (ref 0.0–4.4)
LDL Chol Calc (NIH): 77 mg/dL (ref 0–99)
Triglycerides: 70 mg/dL (ref 0–149)
VLDL Cholesterol Cal: 14 mg/dL (ref 5–40)

## 2023-06-03 LAB — THYROID PANEL WITH TSH
Free Thyroxine Index: 2.4 (ref 1.2–4.9)
T3 Uptake Ratio: 28 % (ref 24–39)
T4, Total: 8.5 ug/dL (ref 4.5–12.0)
TSH: 2.02 u[IU]/mL (ref 0.450–4.500)

## 2023-06-07 ENCOUNTER — Other Ambulatory Visit: Payer: Self-pay | Admitting: Nurse Practitioner

## 2023-06-12 ENCOUNTER — Other Ambulatory Visit: Payer: Self-pay

## 2023-06-12 DIAGNOSIS — Z87891 Personal history of nicotine dependence: Secondary | ICD-10-CM

## 2023-06-12 DIAGNOSIS — Z122 Encounter for screening for malignant neoplasm of respiratory organs: Secondary | ICD-10-CM

## 2023-08-07 DIAGNOSIS — H43813 Vitreous degeneration, bilateral: Secondary | ICD-10-CM | POA: Diagnosis not present

## 2023-08-07 DIAGNOSIS — H5203 Hypermetropia, bilateral: Secondary | ICD-10-CM | POA: Diagnosis not present

## 2023-08-12 DIAGNOSIS — I10 Essential (primary) hypertension: Secondary | ICD-10-CM | POA: Diagnosis not present

## 2023-08-12 DIAGNOSIS — F17210 Nicotine dependence, cigarettes, uncomplicated: Secondary | ICD-10-CM | POA: Diagnosis not present

## 2023-08-12 DIAGNOSIS — M1990 Unspecified osteoarthritis, unspecified site: Secondary | ICD-10-CM | POA: Diagnosis not present

## 2023-08-12 DIAGNOSIS — J449 Chronic obstructive pulmonary disease, unspecified: Secondary | ICD-10-CM | POA: Diagnosis not present

## 2023-08-12 DIAGNOSIS — E669 Obesity, unspecified: Secondary | ICD-10-CM | POA: Diagnosis not present

## 2023-08-12 DIAGNOSIS — G4700 Insomnia, unspecified: Secondary | ICD-10-CM | POA: Diagnosis not present

## 2023-08-12 DIAGNOSIS — I739 Peripheral vascular disease, unspecified: Secondary | ICD-10-CM | POA: Diagnosis not present

## 2023-08-12 DIAGNOSIS — E559 Vitamin D deficiency, unspecified: Secondary | ICD-10-CM | POA: Diagnosis not present

## 2023-08-12 DIAGNOSIS — K219 Gastro-esophageal reflux disease without esophagitis: Secondary | ICD-10-CM | POA: Diagnosis not present

## 2023-08-12 DIAGNOSIS — F3342 Major depressive disorder, recurrent, in full remission: Secondary | ICD-10-CM | POA: Diagnosis not present

## 2023-08-12 DIAGNOSIS — E0590 Thyrotoxicosis, unspecified without thyrotoxic crisis or storm: Secondary | ICD-10-CM | POA: Diagnosis not present

## 2023-08-12 DIAGNOSIS — E785 Hyperlipidemia, unspecified: Secondary | ICD-10-CM | POA: Diagnosis not present

## 2023-09-02 ENCOUNTER — Telehealth: Payer: Self-pay | Admitting: Nurse Practitioner

## 2023-09-02 NOTE — Telephone Encounter (Unsigned)
 Copied from CRM [id number]. Topic: Referral - Prior Authorization Question >> Sep 02, 2023 12:03 PM Felizardo Hotter wrote: Reason for CRM: Pt calling regarding the prior authorization for referral# [id number]. Pt is scheduled for 09/10/2023. Please call pt at  [phone].

## 2023-09-02 NOTE — Telephone Encounter (Signed)
 I have R/C to Patient - If Patient calls back please make her aware her Medicare insurance does not req a Referral on file to be able to see a Specialty.

## 2023-09-10 ENCOUNTER — Ambulatory Visit: Payer: Medicare Other | Admitting: Internal Medicine

## 2023-09-10 ENCOUNTER — Encounter: Payer: Self-pay | Admitting: Internal Medicine

## 2023-09-10 ENCOUNTER — Ambulatory Visit (HOSPITAL_COMMUNITY)
Admission: RE | Admit: 2023-09-10 | Discharge: 2023-09-10 | Disposition: A | Discharge status: Home / Self Care | Payer: Medicare Other | Source: Ambulatory Visit | Attending: Oncology | Admitting: Oncology

## 2023-09-10 VITALS — BP 120/66 | HR 89 | Ht 62.0 in | Wt 179.0 lb

## 2023-09-10 DIAGNOSIS — Z8249 Family history of ischemic heart disease and other diseases of the circulatory system: Secondary | ICD-10-CM | POA: Insufficient documentation

## 2023-09-10 DIAGNOSIS — F17210 Nicotine dependence, cigarettes, uncomplicated: Secondary | ICD-10-CM | POA: Diagnosis not present

## 2023-09-10 DIAGNOSIS — R0609 Other forms of dyspnea: Secondary | ICD-10-CM | POA: Insufficient documentation

## 2023-09-10 DIAGNOSIS — I10 Essential (primary) hypertension: Secondary | ICD-10-CM | POA: Insufficient documentation

## 2023-09-10 DIAGNOSIS — Z87891 Personal history of nicotine dependence: Secondary | ICD-10-CM

## 2023-09-10 DIAGNOSIS — R0602 Shortness of breath: Secondary | ICD-10-CM

## 2023-09-10 DIAGNOSIS — Z122 Encounter for screening for malignant neoplasm of respiratory organs: Secondary | ICD-10-CM

## 2023-09-10 NOTE — Progress Notes (Signed)
 Cardiology Office Note  Date: 09/10/2023   ID: Barbara Boyd, DOB 1950-10-05, MRN [id number]  PCP:  Barbara Feller, FNP  Cardiologist:  Barbara Pointer, MD Electrophysiologist:  None   History of Present Illness: Barbara Boyd is a 73 y.o. female known to have HTN, HLD, COPD/asthma was referred to cardiology clinic for evaluation of SOB.  Her son and grandson are diagnosed with heart failure recently with ejection fraction less than 20%.  She is worried if she has heart failure as well.  Ongoing DOE for many years but worsening in the last 1 year.  Occasional chest pains, once per month.  No angina.  No dizziness, palpitations, leg swelling, syncope.  Past Medical History:  Diagnosis Date   Arthritis    Asthma    Cancer (HCC) 09-2013   left Br. CA, had bilat mastectomy and doing reconstruction now   Cataract    removed both    COPD (chronic obstructive pulmonary disease) (HCC)    GERD (gastroesophageal reflux disease)    Hyperlipidemia    Osteopenia    borderline   Thyroid  disease     Past Surgical History:  Procedure Laterality Date   BILATERAL TOTAL MASTECTOMY WITH AXILLARY LYMPH NODE DISSECTION Bilateral    BREAST RECONSTRUCTION     COLONOSCOPY     POLYPECTOMY      Current Outpatient Medications  Medication Sig Dispense Refill   escitalopram  (LEXAPRO ) 20 MG tablet Take 1 tablet (20 mg total) by mouth daily. 90 tablet 1   fluticasone -salmeterol (ADVAIR DISKUS) 250-50 MCG/ACT AEPB Inhale 1 puff into the lungs in the morning and at bedtime. 3 each 5   hydrochlorothiazide  (HYDRODIURIL ) 25 MG tablet Take 1 tablet (25 mg total) by mouth daily. 90 tablet 1   meloxicam  (MOBIC ) 15 MG tablet TAKE 1 TABLET (15 MG TOTAL) BY MOUTH DAILY. 90 tablet 1   methimazole  (TAPAZOLE ) 5 MG tablet TAKE 1/2 TABLET BY MOUTH DAILY 90 tablet 1   omeprazole  (PRILOSEC) 20 MG capsule Take 1 capsule (20 mg total) by mouth 2 (two) times daily before a meal. 180 capsule 1    simvastatin  (ZOCOR ) 40 MG tablet TAKE 1 TABLET BY MOUTH EVERY DAY 90 tablet 1   SUMAtriptan  (IMITREX ) 100 MG tablet TAKE 1 TABLET (100 MG TOTAL) AS NEEDED. MAY REPEAT IN 2 HOURS IF HEADACHE PERSISTS OR RECURS. 9 tablet 3   tiotropium (SPIRIVA  HANDIHALER) 18 MCG inhalation capsule Place 1 capsule (18 mcg total) into inhaler and inhale daily. 30 capsule 5   tolterodine  (DETROL  LA) 4 MG 24 hr capsule Take 1 capsule (4 mg total) by mouth daily. 90 capsule 1   Vitamin D , Ergocalciferol , (DRISDOL ) 1.25 MG (50000 UNIT) CAPS capsule TAKE 1 CAPSULE (50,000 UNITS TOTAL) BY MOUTH EVERY 7 (SEVEN) DAYS 12 capsule 1   zolpidem  (AMBIEN ) 10 MG tablet Take 1 tablet (10 mg total) by mouth at bedtime as needed for sleep. 30 tablet 5   No current facility-administered medications for this visit.   Allergies:  Sulfa antibiotics, Aspirin, and Penicillins   Social History: The patient  reports that she has been smoking cigarettes. She started smoking about 50 years ago. She has a 101.2 pack-year smoking history. She has never used smokeless tobacco. She reports that she does not drink alcohol and does not use drugs.   Family History: The patient's family history includes Arthritis in her mother; Breast cancer in her maternal aunt, maternal aunt, maternal grandmother, and paternal aunt; Dementia in her  mother; Lung cancer in her father; Osteoporosis in her maternal grandmother and mother.   ROS:  Please see the history of present illness. Otherwise, complete review of systems is positive for none  All other systems are reviewed and negative.   Physical Exam: VS:  BP 120/66 (BP Location: Right Arm, Patient Position: Sitting, Cuff Size: Large)   Pulse 89   Ht 5\' 2"  (1.575 m)   Wt 179 lb (81.2 kg)   SpO2 98%   BMI 32.74 kg/m , BMI Body mass index is 32.74 kg/m.  Wt Readings from Last 3 Encounters:  09/10/23 179 lb (81.2 kg)  06/02/23 178 lb (80.7 kg)  01/07/23 178 lb (80.7 kg)    General: Patient appears  comfortable at rest. HEENT: Conjunctiva and lids normal, oropharynx clear with moist mucosa. Neck: Supple, no elevated JVP or carotid bruits, no thyromegaly. Lungs: Bilateral wheezing and rhonchi present Cardiac: Regular rate and rhythm, no S3 or significant systolic murmur, no pericardial rub. Abdomen: Soft, nontender, no hepatomegaly, bowel sounds present, no guarding or rebound. Extremities: No pitting edema, distal pulses 2+. Skin: Warm and dry. Musculoskeletal: No kyphosis. Neuropsychiatric: Alert and oriented x3, affect grossly appropriate.  Recent Labwork: 06/02/2023: ALT 12; AST 21; BUN 13; Creatinine, Ser 0.68; Hemoglobin 14.1; Platelets 312; Potassium 4.3; Sodium 130; TSH 2.020     Component Value Date/Time   CHOL 150 06/02/2023 1228   CHOL 152 10/23/2012 1648   TRIG 70 06/02/2023 1228   TRIG 57 08/01/2014 0953   TRIG 67 10/23/2012 1648   HDL 59 06/02/2023 1228   HDL 66 08/01/2014 0953   HDL 59 10/23/2012 1648   CHOLHDL 2.5 06/02/2023 1228   LDLCALC 77 06/02/2023 1228   LDLCALC 114 (H) 01/31/2014 1158   LDLCALC 80 10/23/2012 1648     Assessment and Plan:  Family history of CHF: Her son and grandson are diagnosed with heart failure with ejection fraction less than 20%.  She has symptoms of DOE for many years/worsening in the last 1 year.  Obtain echocardiogram.  DOE: Ongoing for many years but worsening in the last 1 year.  Occasional chest pain.  DOE likely secondary to underlying COPD as she has bilateral rhonchi and wheezing on physical exam.  Cannot rule out CAD due to advanced age and cardiac risk factors and imaging evidence of three-vessel coronary artery calcification.  Will obtain Lexiscan .  HTN, controlled: Continue HCTZ 25 mg once daily.  Follow-up with PCP.  HLD, at goal: Continue simvastatin  40 mg nightly.  Follow with PCP.  Goal LDL less than 100.  Nicotine abuse: Currently smokes 1 pack/day.  Counseling provided.       Medication Adjustments/Labs  and Tests Ordered: Current medicines are reviewed at length with the patient today.  Concerns regarding medicines are outlined above.    Disposition:  Follow up  3 months  Signed Barbara Boyd Barbara Boyd Lia, MD, 09/10/2023 1:49 PM    [facility] [address] [place], [place], [place] [address]

## 2023-09-10 NOTE — Patient Instructions (Signed)
 Medication Instructions:  Your physician recommends that you continue on your current medications as directed. Please refer to the Current Medication list given to you today.  *If you need a refill on your cardiac medications before your next appointment, please call your pharmacy*  Lab Work: None If you have labs (blood work) drawn today and your tests are completely normal, you will receive your results only by: MyChart Message (if you have MyChart) OR A paper copy in the mail If you have any lab test that is abnormal or we need to change your treatment, we will call you to review the results.  Testing/Procedures: Your physician has requested that you have an echocardiogram. Echocardiography is a painless test that uses sound waves to create images of your heart. It provides your doctor with information about the size and shape of your heart and how well your heart's chambers and valves are working. This procedure takes approximately one hour. There are no restrictions for this procedure. Please do NOT wear cologne, perfume, aftershave, or lotions (deodorant is allowed). Please arrive 15 minutes prior to your appointment time.  Please note: We ask at that you not bring children with you during ultrasound (echo/ vascular) testing. Due to room size and safety concerns, children are not allowed in the ultrasound rooms during exams. Our front office staff cannot provide observation of children in our lobby area while testing is being conducted. An adult accompanying a patient to their appointment will only be allowed in the ultrasound room at the discretion of the ultrasound technician under special circumstances. We apologize for any inconvenience.  Your physician has requested that you have a lexiscan  myoview . For further information please visit [email or web address] Please follow instruction sheet, as given.   Follow-Up: At [facility], you and your health needs are our priority.   As part of our continuing mission to provide you with exceptional heart care, our providers are all part of one team.  This team includes your primary Cardiologist (physician) and Advanced Practice Providers or APPs (Physician Assistants and Nurse Practitioners) who all work together to provide you with the care you need, when you need it.  Your next appointment:   3 month(s)  Provider:   You may see Vishnu Mallipeddi, MD or one of the following Advanced Practice Providers on your designated Care Team:   [place], PA-C  Sc[place]ia [place], [place] Theotis Flake, [place]     We recommend signing up for the patient portal called "MyChart".  Sign up information is provided on this After Visit Summary.  MyChart is used to connect with patients for Virtual Visits (Telemedicine).  Patients are able to view lab/test results, encounter notes, upcoming appointments, etc.  Non-urgent messages can be sent to your provider as well.   To learn more about what you can do with MyChart, go to [email or web address].   Other Instructions

## 2023-09-15 ENCOUNTER — Telehealth: Payer: Self-pay

## 2023-09-15 NOTE — Telephone Encounter (Unsigned)
 Copied from CRM [id number]. Topic: Clinical - Lab/Test Results >> Sep 15, 2023  4:14 PM Donald Frost wrote: Reason for CRM: The patient called in inquiring about her CT Chest results as she hasn't seen anything yet on her my chart. Please assist patient further as soon as possible

## 2023-09-16 NOTE — Telephone Encounter (Signed)
 Called and advised patient that result was not available yet and we would contact her as soon as we have the results

## 2023-10-10 ENCOUNTER — Ambulatory Visit (HOSPITAL_COMMUNITY)
Admission: RE | Admit: 2023-10-10 | Discharge: 2023-10-10 | Disposition: A | Discharge status: Home / Self Care | Source: Ambulatory Visit | Attending: Internal Medicine | Admitting: Internal Medicine

## 2023-10-10 ENCOUNTER — Ambulatory Visit (HOSPITAL_BASED_OUTPATIENT_CLINIC_OR_DEPARTMENT_OTHER)
Admission: RE | Admit: 2023-10-10 | Discharge: 2023-10-10 | Disposition: A | Discharge status: Home / Self Care | Source: Ambulatory Visit | Attending: Internal Medicine

## 2023-10-10 DIAGNOSIS — R0602 Shortness of breath: Secondary | ICD-10-CM | POA: Insufficient documentation

## 2023-10-10 LAB — NM MYOCAR MULTI W/SPECT W/WALL MOTION / EF
LV dias vol: 69 mL (ref 46–106)
LV sys vol: 24 mL
Nuc Stress EF: 66 %
Peak HR: 87 {beats}/min
RATE: 0.6
Rest HR: 67 {beats}/min
Rest Nuclear Isotope Dose: 11 mCi
SDS: 1
SRS: 2
SSS: 3
ST Depression (mm): 0 mm
Stress Nuclear Isotope Dose: 31.4 mCi
TID: 1

## 2023-10-10 LAB — ECHOCARDIOGRAM COMPLETE
AR max vel: 1.69 cm2
AV Area VTI: 1.62 cm2
AV Area mean vel: 1.97 cm2
AV Mean grad: 4 mmHg
AV Peak grad: 7.5 mmHg
Ao pk vel: 1.37 m/s
Area-P 1/2: 3.48 cm2
S' Lateral: 2.8 cm

## 2023-10-10 MED ORDER — REGADENOSON 0.4 MG/5ML IV SOLN
INTRAVENOUS | Status: AC
Start: 2023-10-10 — End: 2023-10-10
  Administered 2023-10-10: 0.4 mg via INTRAVENOUS
  Filled 2023-10-10: qty 5

## 2023-10-10 MED ORDER — TECHNETIUM TC 99M TETROFOSMIN IV KIT
31.4000 | PACK | Freq: Once | INTRAVENOUS | Status: AC | PRN
  Administered 2023-10-10: 31.4 via INTRAVENOUS

## 2023-10-10 MED ORDER — SODIUM CHLORIDE FLUSH 0.9 % IV SOLN
INTRAVENOUS | Status: AC
  Filled 2023-10-10: qty 10

## 2023-10-10 MED ORDER — TECHNETIUM TC 99M TETROFOSMIN IV KIT
11.0000 | PACK | Freq: Once | INTRAVENOUS | Status: AC | PRN
  Administered 2023-10-10: 11 via INTRAVENOUS

## 2023-10-10 NOTE — Progress Notes (Signed)
*  PRELIMINARY RESULTS* Echocardiogram 2D Echocardiogram has been performed.  Barbara Boyd 10/10/2023, 9:22 AM

## 2023-10-23 ENCOUNTER — Ambulatory Visit: Payer: Self-pay | Admitting: Internal Medicine

## 2023-11-14 ENCOUNTER — Encounter: Payer: Self-pay | Admitting: *Deleted

## 2023-11-17 ENCOUNTER — Encounter: Payer: Self-pay | Admitting: *Deleted

## 2023-11-17 NOTE — Progress Notes (Signed)
Patient notified via mail of LDCT l ung cancer screening results with recommendations to follow up in 12 months.  Also notified of incidental findings and need to follow up with PCP.  Patient's referring provider was sent a copy of results.    IMPRESSION: Lung-RADS 2, benign appearance or behavior. Continue annual screening with low-dose chest CT without contrast in 12 months.  Aortic Atherosclerosis (ICD10-I70.0) and Emphysema (ICD10-J43.9).   

## 2023-11-23 ENCOUNTER — Other Ambulatory Visit: Payer: Self-pay | Admitting: Nurse Practitioner

## 2023-11-23 DIAGNOSIS — K219 Gastro-esophageal reflux disease without esophagitis: Secondary | ICD-10-CM

## 2023-11-28 ENCOUNTER — Ambulatory Visit (INDEPENDENT_AMBULATORY_CARE_PROVIDER_SITE_OTHER): Payer: PPO | Admitting: Nurse Practitioner

## 2023-11-28 ENCOUNTER — Encounter: Payer: Self-pay | Admitting: Nurse Practitioner

## 2023-11-28 ENCOUNTER — Ambulatory Visit (INDEPENDENT_AMBULATORY_CARE_PROVIDER_SITE_OTHER)

## 2023-11-28 VITALS — BP 134/76 | HR 73 | Temp 98.9°F | Ht 62.0 in | Wt 176.0 lb

## 2023-11-28 DIAGNOSIS — E0500 Thyrotoxicosis with diffuse goiter without thyrotoxic crisis or storm: Secondary | ICD-10-CM | POA: Diagnosis not present

## 2023-11-28 DIAGNOSIS — K219 Gastro-esophageal reflux disease without esophagitis: Secondary | ICD-10-CM

## 2023-11-28 DIAGNOSIS — M25561 Pain in right knee: Secondary | ICD-10-CM | POA: Diagnosis not present

## 2023-11-28 DIAGNOSIS — J4489 Other specified chronic obstructive pulmonary disease: Secondary | ICD-10-CM | POA: Diagnosis not present

## 2023-11-28 DIAGNOSIS — F3342 Major depressive disorder, recurrent, in full remission: Secondary | ICD-10-CM

## 2023-11-28 DIAGNOSIS — E782 Mixed hyperlipidemia: Secondary | ICD-10-CM | POA: Diagnosis not present

## 2023-11-28 DIAGNOSIS — R3915 Urgency of urination: Secondary | ICD-10-CM

## 2023-11-28 DIAGNOSIS — I10 Essential (primary) hypertension: Secondary | ICD-10-CM | POA: Diagnosis not present

## 2023-11-28 DIAGNOSIS — M1711 Unilateral primary osteoarthritis, right knee: Secondary | ICD-10-CM | POA: Diagnosis not present

## 2023-11-28 MED ORDER — HYDROCHLOROTHIAZIDE 25 MG PO TABS: 25.0000 mg | ORAL_TABLET | Freq: Every day | ORAL | 1 refills | Status: DC

## 2023-11-28 MED ORDER — SIMVASTATIN 40 MG PO TABS: ORAL_TABLET | ORAL | 1 refills | Status: DC

## 2023-11-28 MED ORDER — TOLTERODINE TARTRATE ER 4 MG PO CP24: 4.0000 mg | ORAL_CAPSULE | Freq: Every day | ORAL | 1 refills | Status: DC

## 2023-11-28 MED ORDER — FLUTICASONE-SALMETEROL 250-50 MCG/ACT IN AEPB: 1.0000 | INHALATION_SPRAY | Freq: Two times a day (BID) | RESPIRATORY_TRACT | 5 refills | Status: DC

## 2023-11-28 MED ORDER — ESCITALOPRAM OXALATE 20 MG PO TABS: 20.0000 mg | ORAL_TABLET | Freq: Every day | ORAL | 1 refills | Status: DC

## 2023-11-28 MED ORDER — METHIMAZOLE 5 MG PO TABS: ORAL_TABLET | ORAL | 1 refills | Status: DC

## 2023-11-28 MED ORDER — OMEPRAZOLE 20 MG PO CPDR: 20.0000 mg | DELAYED_RELEASE_CAPSULE | Freq: Two times a day (BID) | ORAL | 1 refills | Status: DC

## 2023-11-28 MED ORDER — TIOTROPIUM BROMIDE MONOHYDRATE 18 MCG IN CAPS: 18.0000 ug | ORAL_CAPSULE | Freq: Every day | RESPIRATORY_TRACT | 5 refills | Status: AC

## 2023-11-28 NOTE — Progress Notes (Signed)
 Subjective:    Patient ID: Barbara Boyd, female    DOB: 08-Feb-1951, 73 y.o.   MRN: [id number]   Chief Complaint: annual physical     HPI:  Barbara Boyd is a 73 y.o. who identifies as a female who was assigned female at birth.   Social history: Lives with: husband Work history: retired   Water engineer in today for follow up of the following chronic medical issues:  1. Primary hypertension No c/o chest pain, sob or headache. Does not check blood pressure at home. BP Readings from Last 3 Encounters:  09/10/23 120/66  06/02/23 110/70  01/07/23 (!) 143/83      2. Mixed hyperlipidemia Does try to watch diet. Does no dedicated exercise. Lab Results  Component Value Date   CHOL 150 06/02/2023   HDL 59 06/02/2023   LDLCALC 77 06/02/2023   TRIG 70 06/02/2023   CHOLHDL 2.5 06/02/2023   The 10-year ASCVD risk score (Arnett DK, et al., 2019) is: 21.4%    3. Gastroesophageal reflux disease without esophagitis Is on omeprazole  daily and is doing well.  4. COPD with chronic bronchitis Is on spirivia daily as well as advair. She wheezes a lot but refuses to quit smoking. Has not been using spirivia much lately  5. Graves disease No issues that she is aware of. Is ion tapazole  daily Lab Results  Component Value Date   TSH 2.020 06/02/2023     6. Urinary urgency Is on detrol  L A which helps some.  7. Tobacco abuse counseling No desire  to quit smoking. Smokes over 1 pack a day. Last low dose CT scan was done  on 09/10/23 and was clear.   8. Vitamin D  deficiency Is on weekly vitamin d  supplement  9. Osteopenia of lumbar spine Last dexascan was done on 05/23/20. Needs to be repeated. Wants to do at next visit  10. Recurrent major depressive disorder, in full remission (HCC) Has been on lexapro  for several years. Says she is doing well.    11/28/2023   11:20 AM 06/02/2023   11:42 AM 12/24/2022    8:01 AM  Depression screen PHQ 2/9  Decreased Interest 0 0 0  Down,  Depressed, Hopeless 0 0 0  PHQ - 2 Score 0 0 0  Altered sleeping   1  Tired, decreased energy   1  Change in appetite   1  Feeling bad or failure about yourself    0  Trouble concentrating   0  Moving slowly or fidgety/restless   0  Suicidal thoughts   0  PHQ-9 Score   3  Difficult doing work/chores   Somewhat difficult     11. Primary insomnia She has recently stopped taking ambien . She is only taking occasionally  12. BMI 30.0-30.9,adult No recent weight changes.   Wt Readings from Last 3 Encounters:  11/28/23 176 lb (79.8 kg)  09/10/23 179 lb (81.2 kg)  06/02/23 178 lb (80.7 kg)   BMI Readings from Last 3 Encounters:  11/28/23 32.19 kg/m  09/10/23 32.74 kg/m  06/02/23 32.56 kg/m       New complaints: Right knee pain- started several months ago. Has intermittent pain. Usually when walking. Occurs couple times a week and last less than an hour. Sitting helps.  Allergies  Allergen Reactions   Sulfa Antibiotics Hives   Aspirin Swelling and Rash    Swelling of face and throat   Penicillins Hives and Rash   Outpatient Encounter Medications as of  11/28/2023  Medication Sig   escitalopram  (LEXAPRO ) 20 MG tablet Take 1 tablet (20 mg total) by mouth daily.   fluticasone -salmeterol (ADVAIR DISKUS) 250-50 MCG/ACT AEPB Inhale 1 puff into the lungs in the morning and at bedtime.   hydrochlorothiazide  (HYDRODIURIL ) 25 MG tablet Take 1 tablet (25 mg total) by mouth daily.   meloxicam  (MOBIC ) 15 MG tablet TAKE 1 TABLET (15 MG TOTAL) BY MOUTH DAILY.   methimazole  (TAPAZOLE ) 5 MG tablet TAKE 1/2 TABLET BY MOUTH DAILY   omeprazole  (PRILOSEC) 20 MG capsule TAKE 1 CAPSULE (20 MG TOTAL) BY MOUTH 2 (TWO) TIMES DAILY BEFORE A MEAL.   simvastatin  (ZOCOR ) 40 MG tablet TAKE 1 TABLET BY MOUTH EVERY DAY   SUMAtriptan  (IMITREX ) 100 MG tablet TAKE 1 TABLET (100 MG TOTAL) AS NEEDED. MAY REPEAT IN 2 HOURS IF HEADACHE PERSISTS OR RECURS.   tiotropium (SPIRIVA  HANDIHALER) 18 MCG inhalation  capsule Place 1 capsule (18 mcg total) into inhaler and inhale daily.   tolterodine  (DETROL  LA) 4 MG 24 hr capsule Take 1 capsule (4 mg total) by mouth daily.   Vitamin D , Ergocalciferol , (DRISDOL ) 1.25 MG (50000 UNIT) CAPS capsule TAKE 1 CAPSULE (50,000 UNITS TOTAL) BY MOUTH EVERY 7 (SEVEN) DAYS   zolpidem  (AMBIEN ) 10 MG tablet Take 1 tablet (10 mg total) by mouth at bedtime as needed for sleep.   No facility-administered encounter medications on file as of 11/28/2023.    Past Surgical History:  Procedure Laterality Date   BILATERAL TOTAL MASTECTOMY WITH AXILLARY LYMPH NODE DISSECTION Bilateral    BREAST RECONSTRUCTION     COLONOSCOPY     POLYPECTOMY      Family History  Problem Relation Age of Onset   Arthritis Mother    Osteoporosis Mother    Dementia Mother    Lung cancer Father    Osteoporosis Maternal Grandmother    Breast cancer Maternal Grandmother    Breast cancer Maternal Aunt    Breast cancer Maternal Aunt    Breast cancer Paternal Aunt    Colon cancer Neg Hx    Rectal cancer Neg Hx    Stomach cancer Neg Hx    Colon polyps Neg Hx    Heart disease Neg Hx       Controlled substance contract: 11/28/22- drug screen today     Review of Systems  Constitutional:  Negative for diaphoresis.  Eyes:  Negative for pain.  Respiratory:  Negative for shortness of breath.   Cardiovascular:  Negative for chest pain, palpitations and leg swelling.  Gastrointestinal:  Negative for abdominal pain.  Endocrine: Negative for polydipsia.  Skin:  Negative for rash.  Neurological:  Negative for dizziness, weakness and headaches.  Hematological:  Does not bruise/bleed easily.  All other systems reviewed and are negative.      Objective:   Physical Exam Vitals and nursing note reviewed.  Constitutional:      General: She is not in acute distress.    Appearance: Normal appearance. She is well-developed.  HENT:     Head: Normocephalic.     Right Ear: Tympanic membrane  normal.     Left Ear: Tympanic membrane normal.     Nose: Nose normal.     Mouth/Throat:     Mouth: Mucous membranes are moist.  Eyes:     Pupils: Pupils are equal, round, and reactive to light.  Neck:     Vascular: No carotid bruit or JVD.  Cardiovascular:     Rate and Rhythm: Normal rate and regular  rhythm.     Heart sounds: Normal heart sounds.  Pulmonary:     Effort: Pulmonary effort is normal. No respiratory distress.     Breath sounds: Rhonchi (that clear with coughing) present. No wheezing or rales.  Chest:     Chest wall: No tenderness.  Abdominal:     General: Bowel sounds are normal. There is no distension or abdominal bruit.     Palpations: Abdomen is soft. There is no hepatomegaly, splenomegaly, mass or pulsatile mass.     Tenderness: There is no abdominal tenderness.  Musculoskeletal:        General: Normal range of motion.     Cervical back: Normal range of motion and neck supple.  Lymphadenopathy:     Cervical: No cervical adenopathy.  Skin:    General: Skin is warm and dry.  Neurological:     Mental Status: She is alert and oriented to person, place, and time.     Deep Tendon Reflexes: Reflexes are normal and symmetric.  Psychiatric:        Behavior: Behavior normal.        Thought Content: Thought content normal.        Judgment: Judgment normal.    BP 134/76   Pulse 73   Temp 98.9 F (37.2 C) (Temporal)   Ht 5' 2 (1.575 m)   Wt 176 lb (79.8 kg)   SpO2 96%   BMI 32.19 kg/m   Right knee pain- mild osteoarthritis of right inner-Preliminary reading by Ronal Lunger, FNP  [facility]       Assessment & Plan:  Barbara Boyd comes in today with chief complaint of annual physical  Diagnosis and orders addressed:  1. Essential hypertension (Primary) Low sodium diet - hydrochlorothiazide  (HYDRODIURIL ) 25 MG tablet; Take 1 tablet (25 mg total) by mouth daily.  Dispense: 90 tablet; Refill: 1 - CBC with Differential/Platelet - CMP14+EGFR  2. Mixed  hyperlipidemia Low fat diet - simvastatin  (ZOCOR ) 40 MG tablet; TAKE 1 TABLET BY MOUTH EVERY DAY  Dispense: 90 tablet; Refill: 1 - Lipid panel  3. Gastroesophageal reflux disease without esophagitis Avoid spicy foods Do not eat 2 hours prior to bedtime - omeprazole  (PRILOSEC) 20 MG capsule; Take 1 capsule (20 mg total) by mouth 2 (two) times daily before a meal.  Dispense: 180 capsule; Refill: 1  4. COPD with chronic bronchitis (HCC) Smoking cessation - fluticasone -salmeterol (ADVAIR DISKUS) 250-50 MCG/ACT AEPB; Inhale 1 puff into the lungs in the morning and at bedtime.  Dispense: 3 each; Refill: 5 - tiotropium (SPIRIVA  HANDIHALER) 18 MCG inhalation capsule; Place 1 capsule (18 mcg total) into inhaler and inhale daily.  Dispense: 30 capsule; Refill: 5  5. Graves disease Labs pending - methimazole  (TAPAZOLE ) 5 MG tablet; TAKE 1/2 TABLET BY MOUTH DAILY  Dispense: 90 tablet; Refill: 1 - Thyroid  Panel With TSH  6. Urinary urgency - tolterodine  (DETROL  LA) 4 MG 24 hr capsule; Take 1 capsule (4 mg total) by mouth daily.  Dispense: 90 capsule; Refill: 1  7. Tobacco abuse counseling Smoking cessation encuraged  8. Vitamin D  deficiency Continue vitamin d  supplement  9. Osteopenia of lumbar spine Weight bearing exercises Wants to repeat dexascan at next visit  10. Recurrent major depressive disorder, in full remission (HCC) Stress management - escitalopram  (LEXAPRO ) 20 MG tablet; Take 1 tablet (20 mg total) by mouth daily.  Dispense: 90 tablet; Refill: 1  11. Primary insomnia Bedtime routine - zolpidem  (AMBIEN ) 10 MG tablet; Take 1 tablet (10 mg  total) by mouth at bedtime as needed for sleep.  Dispense: 30 tablet; Refill: 5  12. BMI 30.0-30.9,adult Discussed diet and exercise for person with BMI >25 Will recheck weight in 3-6 months  13. Right knee pain Tylenol  as needed Ice Elevate RTO prn  Labs pending Health Maintenance reviewed Diet and exercise encouraged  Follow up  plan: 6 months   Mary-Margaret Gladis, FNP

## 2023-11-28 NOTE — Patient Instructions (Signed)
 Acute Knee Pain, Adult Many things can cause knee pain. Sometimes, knee pain is sudden (acute). It may be caused by damage, swelling, or irritation of the muscles and tissues that support your knee. Pain may come from: A fall. An injury to the knee from twisting motions. A hit to the knee. Infection. The pain often goes away on its own with time and rest. If the pain does not go away, tests may be done to find out what is causing the pain. These may include: Imaging tests, such as an X-ray, MRI, CT scan, or ultrasound. Joint aspiration. In this test, fluid is removed from the knee and checked. Arthroscopy. In this test, a lighted tube is put in the knee and an image is shown on a screen. A biopsy. In this test, a health care provider will remove a small piece of tissue for testing. Follow these instructions at home: If you have a knee sleeve or brace that can be taken off:  Wear the knee sleeve or brace as told by your provider. Take it off only if your provider says that you can. Check the skin around it every day. Tell your provider if you see problems. Loosen the knee sleeve or brace if your toes tingle, are numb, or turn cold and blue. Keep the knee sleeve or brace clean and dry. Bathing If the knee sleeve or brace is not waterproof: Do not let it get wet. Cover it when you take a bath or shower. Use a cover that does not let any water in. Managing pain, stiffness, and swelling  If told, put ice on the area. If you have a knee sleeve or brace that you can take off, remove it as told. Put ice in a plastic bag. Place a towel between your skin and the bag. Leave the ice on for 20 minutes, 2-3 times a day. If your skin turns bright red, take off the ice right away to prevent skin damage. The risk of damage is higher if you cannot feel pain, heat, or cold. Move your toes often to reduce stiffness and swelling. Raise the injured area above the level of your heart while you are sitting  or lying down. Use a pillow to support your foot as needed. If told, use an elastic bandage to put pressure (compression) on your injured knee. This may control swelling, give support, and help with discomfort. Sleep with a pillow under your knee. Activity Rest your knee. Do not do things that cause pain or make pain worse. Do not stand or walk on your injured knee until you're told it's okay. Use crutches as told. Avoid activities where both feet leave the ground at the same time and put stress on the joints. Avoid running, jumping rope, and doing jumping jacks. Work with a physical therapist to make a safe exercise program if told. Physical therapy helps your knee move better and get stronger. Exercise as told. General instructions Take your medicines only as told by your provider. If you are overweight, work with your provider and an expert in healthy eating, called a dietician, to set goals to lose weight. Being overweight can make your knee hurt more. Do not smoke, vape, or use products with nicotine or tobacco in them. If you need help quitting, talk with your provider. Return to normal activities when you are told. Ask what things are safe for you to do. Watch for any changes in your symptoms. Keep all follow-up visits. Your provider will check  your healing and adjust treatments if needed. Contact a health care provider if: The knee pain does not stop. The knee pain changes or gets worse. You have a fever along with knee pain. Your knee is red or feels warm when you touch it. Your knee gives out or locks up. Get help right away if: Your knee swells and the swelling gets worse. You cannot move your knee. You have very bad knee pain that does not get better with medicine. This information is not intended to replace advice given to you by your health care provider. Make sure you discuss any questions you have with your health care provider. Document Revised: 02/06/2023 Document  Reviewed: 07/01/2022 Elsevier Patient Education  2024 [employer].

## 2023-11-29 LAB — CMP14+EGFR
ALT: 11 IU/L (ref 0–32)
AST: 19 IU/L (ref 0–40)
Albumin: 3.7 g/dL — ABNORMAL LOW (ref 3.8–4.8)
Alkaline Phosphatase: 87 IU/L (ref 44–121)
BUN/Creatinine Ratio: 15 (ref 12–28)
BUN: 12 mg/dL (ref 8–27)
Bilirubin Total: 0.4 mg/dL (ref 0.0–1.2)
CO2: 25 mmol/L (ref 20–29)
Calcium: 9 mg/dL (ref 8.7–10.3)
Chloride: 93 mmol/L — ABNORMAL LOW (ref 96–106)
Creatinine, Ser: 0.8 mg/dL (ref 0.57–1.00)
Globulin, Total: 2.6 g/dL (ref 1.5–4.5)
Glucose: 88 mg/dL (ref 70–99)
Potassium: 4.9 mmol/L (ref 3.5–5.2)
Sodium: 131 mmol/L — ABNORMAL LOW (ref 134–144)
Total Protein: 6.3 g/dL (ref 6.0–8.5)
eGFR: 78 mL/min/1.73 (ref >59)

## 2023-11-29 LAB — CBC WITH DIFFERENTIAL/PLATELET
Basophils Absolute: 0 x10E3/uL (ref 0.0–0.2)
Basos: 1 %
EOS (ABSOLUTE): 0.2 x10E3/uL (ref 0.0–0.4)
Eos: 4 %
Hematocrit: 40.6 % (ref 34.0–46.6)
Hemoglobin: 13.3 g/dL (ref 11.1–15.9)
Immature Grans (Abs): 0 x10E3/uL (ref 0.0–0.1)
Immature Granulocytes: 0 %
Lymphocytes Absolute: 1.7 x10E3/uL (ref 0.7–3.1)
Lymphs: 30 %
MCH: 33.2 pg — ABNORMAL HIGH (ref 26.6–33.0)
MCHC: 32.8 g/dL (ref 31.5–35.7)
MCV: 101 fL — ABNORMAL HIGH (ref 79–97)
Monocytes Absolute: 0.7 x10E3/uL (ref 0.1–0.9)
Monocytes: 13 %
Neutrophils Absolute: 2.9 x10E3/uL (ref 1.4–7.0)
Neutrophils: 52 %
Platelets: 322 x10E3/uL (ref 150–450)
RBC: 4.01 x10E6/uL (ref 3.77–5.28)
RDW: 11.8 % (ref 11.7–15.4)
WBC: 5.5 x10E3/uL (ref 3.4–10.8)

## 2023-11-29 LAB — LIPID PANEL
Chol/HDL Ratio: 2.6 ratio (ref 0.0–4.4)
Cholesterol, Total: 140 mg/dL (ref 100–199)
HDL: 53 mg/dL (ref >39)
LDL Chol Calc (NIH): 75 mg/dL (ref 0–99)
Triglycerides: 56 mg/dL (ref 0–149)
VLDL Cholesterol Cal: 12 mg/dL (ref 5–40)

## 2023-12-01 ENCOUNTER — Ambulatory Visit: Payer: Self-pay | Admitting: Nurse Practitioner

## 2023-12-01 NOTE — Progress Notes (Signed)
 Spoke with patient on phone and reviewed all labs

## 2023-12-04 ENCOUNTER — Ambulatory Visit (INDEPENDENT_AMBULATORY_CARE_PROVIDER_SITE_OTHER)

## 2023-12-04 VITALS — BP 134/76 | HR 73 | Ht 62.0 in | Wt 176.0 lb

## 2023-12-04 DIAGNOSIS — Z0000 Encounter for general adult medical examination without abnormal findings: Secondary | ICD-10-CM | POA: Diagnosis not present

## 2023-12-04 NOTE — Patient Instructions (Signed)
 Barbara Boyd , Thank you for taking time out of your busy schedule to complete your Annual Wellness Visit with me. I enjoyed our conversation and look forward to speaking with you again next year. I, as well as your care team,  appreciate your ongoing commitment to your health goals. Please review the following plan we discussed and let me know if I can assist you in the future. Your Game plan/ To Do List    Follow up Visits: Next Medicare AWV with our clinical staff: 12/06/24 at 1:50p.m   Next Office Visit with your provider: 05/28/24 at 1:50p.m.  Clinician Recommendations:  Aim for 30 minutes of exercise or brisk walking, 6-8 glasses of water, and 5 servings of fruits and vegetables each day.       This is a list of the screening recommended for you and due dates:  Health Maintenance  Topic Date Due   Medicare Annual Wellness Visit  06/05/2023   COVID-19 Vaccine (3 - Moderna risk series) 12/14/2023*   Flu Shot  12/19/2023   Screening for Lung Cancer  09/09/2024   Colon Cancer Screening  01/12/2025   DTaP/Tdap/Td vaccine (2 - Td or Tdap) 04/26/2029   Pneumococcal Vaccine for age over 78  Completed   DEXA scan (bone density measurement)  Completed   Hepatitis C Screening  Completed   Zoster (Shingles) Vaccine  Completed   Hepatitis B Vaccine  Aged Out   HPV Vaccine  Aged Out   Meningitis B Vaccine  Aged Out  *Topic was postponed. The date shown is not the original due date.    Advanced directives: (Declined) Advance directive discussed with you today. Even though you declined this today, please call our office should you change your mind, and we can give you the proper paperwork for you to fill out. Advance Care Planning is important because it:  [x]  Makes sure you receive the medical care that is consistent with your values, goals, and preferences  [x]  It provides guidance to your family and loved ones and reduces their decisional burden about whether or not they are making the right  decisions based on your wishes.  Follow the link provided in your after visit summary or read over the paperwork we have mailed to you to help you started getting your Advance Directives in place. If you need assistance in completing these, please reach out to us  so that we can help you!  See attachments for Preventive Care and Fall Prevention Tips.

## 2023-12-04 NOTE — Progress Notes (Signed)
 Subjective:   Barbara Boyd is a 73 y.o. who presents for a Medicare Wellness preventive visit.  As a reminder, Annual Wellness Visits don't include a physical exam, and some assessments may be limited, especially if this visit is performed virtually. We may recommend an in-person follow-up visit with your provider if needed.  Visit Complete: Virtual I connected with  Barbara Boyd on 12/04/23 by a audio enabled telemedicine application and verified that I am speaking with the correct person using two identifiers.  Patient Location: Home  Provider Location: Home Office  I discussed the limitations of evaluation and management by telemedicine. The patient expressed understanding and agreed to proceed.  Vital Signs: Because this visit was a virtual/telehealth visit, some criteria may be missing or patient reported. Any vitals not documented were not able to be obtained and vitals that have been documented are patient reported.  VideoDeclined- This patient declined Librarian, academic. Therefore the visit was completed with audio only.  Persons Participating in Visit: Patient.  AWV Questionnaire: No: Patient Medicare AWV questionnaire was not completed prior to this visit.  Cardiac Risk Factors include: advanced age (>3men, >67 women);family history of premature cardiovascular disease;dyslipidemia;smoking/ tobacco exposure     Objective:    Today's Vitals   12/04/23 1322  BP: 134/76  Pulse: 73  Weight: 176 lb (79.8 kg)  Height: 5' 2 (1.575 m)   Body mass index is 32.19 kg/m.     12/04/2023    1:27 PM 06/04/2022    1:23 PM 05/16/2021   10:50 AM 04/18/2014   10:35 AM  Advanced Directives  Does Patient Have a Medical Advance Directive? No No No No   Would patient like information on creating a medical advance directive?  No - Patient declined No - Patient declined      Data saved with a previous flowsheet row definition    Current  Medications (verified) Outpatient Encounter Medications as of 12/04/2023  Medication Sig   escitalopram  (LEXAPRO ) 20 MG tablet Take 1 tablet (20 mg total) by mouth daily.   fluticasone -salmeterol (ADVAIR DISKUS) 250-50 MCG/ACT AEPB Inhale 1 puff into the lungs in the morning and at bedtime.   hydrochlorothiazide  (HYDRODIURIL ) 25 MG tablet Take 1 tablet (25 mg total) by mouth daily.   meloxicam  (MOBIC ) 15 MG tablet TAKE 1 TABLET (15 MG TOTAL) BY MOUTH DAILY.   methimazole  (TAPAZOLE ) 5 MG tablet TAKE 1/2 TABLET BY MOUTH DAILY   omeprazole  (PRILOSEC) 20 MG capsule Take 1 capsule (20 mg total) by mouth 2 (two) times daily before a meal.   simvastatin  (ZOCOR ) 40 MG tablet TAKE 1 TABLET BY MOUTH EVERY DAY   SUMAtriptan  (IMITREX ) 100 MG tablet TAKE 1 TABLET (100 MG TOTAL) AS NEEDED. MAY REPEAT IN 2 HOURS IF HEADACHE PERSISTS OR RECURS.   tiotropium (SPIRIVA  HANDIHALER) 18 MCG inhalation capsule Place 1 capsule (18 mcg total) into inhaler and inhale daily.   tolterodine  (DETROL  LA) 4 MG 24 hr capsule Take 1 capsule (4 mg total) by mouth daily.   Vitamin D , Ergocalciferol , (DRISDOL ) 1.25 MG (50000 UNIT) CAPS capsule TAKE 1 CAPSULE (50,000 UNITS TOTAL) BY MOUTH EVERY 7 (SEVEN) DAYS   zolpidem  (AMBIEN ) 10 MG tablet Take 1 tablet (10 mg total) by mouth at bedtime as needed for sleep.   No facility-administered encounter medications on file as of 12/04/2023.    Allergies (verified) Sulfa antibiotics, Aspirin, and Penicillins   History: Past Medical History:  Diagnosis Date   Arthritis  Asthma    Cancer (HCC) 09/2013   left Br. CA, had bilat mastectomy and doing reconstruction now   Cataract    removed both    COPD (chronic obstructive pulmonary disease) (HCC)    GERD (gastroesophageal reflux disease)    Hyperlipidemia    Hypertension    Osteopenia    borderline   Thyroid  disease    Past Surgical History:  Procedure Laterality Date   BILATERAL TOTAL MASTECTOMY WITH AXILLARY LYMPH NODE  DISSECTION Bilateral    BREAST RECONSTRUCTION     COLONOSCOPY     POLYPECTOMY     Family History  Problem Relation Age of Onset   Arthritis Mother    Osteoporosis Mother    Dementia Mother    Hyperlipidemia Mother    Lung cancer Father    Osteoporosis Maternal Grandmother    Breast cancer Maternal Grandmother    Hypertension Maternal Grandmother    Breast cancer Maternal Aunt    Breast cancer Maternal Aunt    Breast cancer Paternal Aunt    Heart failure Brother    Heart failure Brother    Hyperlipidemia Sister    Hypertension Sister    Hypertension Brother    Colon cancer Neg Hx    Rectal cancer Neg Hx    Stomach cancer Neg Hx    Colon polyps Neg Hx    Heart disease Neg Hx    Social History   Socioeconomic History   Marital status: Divorced    Spouse name: Not on file   Number of children: 2   Years of education: Not on file   Highest education level: Some college, no degree  Occupational History   Not on file  Tobacco Use   Smoking status: Every Day    Current packs/day: 2.00    Average packs/day: 2.0 packs/day for 50.8 years (101.7 ttl pk-yrs)    Types: Cigarettes    Start date: 01/28/1973   Smokeless tobacco: Never  Vaping Use   Vaping status: Never Used  Substance and Sexual Activity   Alcohol use: No    Alcohol/week: 0.0 standard drinks of alcohol   Drug use: No   Sexual activity: Not on file  Other Topics Concern   Not on file  Social History Narrative   1 son died in 07-25-2020 at age 104. Had Muscular Dystrophy.   1 son living and lives close by.    1 grandson.   Social Drivers of Corporate investment banker Strain: Low Risk  (12/04/2023)   Overall Financial Resource Strain (CARDIA)    Difficulty of Paying Living Expenses: Not hard at all  Food Insecurity: No Food Insecurity (12/04/2023)   Hunger Vital Sign    Worried About Running Out of Food in the Last Year: Never true    Ran Out of Food in the Last Year: Never true  Transportation  Needs: No Transportation Needs (12/04/2023)   PRAPARE - Administrator, Civil Service (Medical): No    Lack of Transportation (Non-Medical): No  Physical Activity: Insufficiently Active (12/04/2023)   Exercise Vital Sign    Days of Exercise per Week: 3 days    Minutes of Exercise per Session: 20 min  Stress: No Stress Concern Present (12/04/2023)   [employer] of Occupational Health - Occupational Stress Questionnaire    Feeling of Stress: Not at all  Social Connections: Moderately Isolated (12/04/2023)   Social Connection and Isolation Panel    Frequency of Communication with Friends  and Family: More than three times a week    Frequency of Social Gatherings with Friends and Family: More than three times a week    Attends Religious Services: 1 to 4 times per year    Active Member of [employer] or Organizations: No    Attends Engineer, structural: Never    Marital Status: Divorced    Tobacco Counseling Ready to quit: No Counseling given: Yes    Clinical Intake:  Pre-visit preparation completed: Yes  Pain : No/denies pain     BMI - recorded: 32.19 Nutritional Status: BMI > 30  Obese Nutritional Risks: None Diabetes: No  No results found for: HGBA1C   How often do you need to have someone help you when you read instructions, pamphlets, or other written materials from your doctor or pharmacy?: 1 - Never  Interpreter Needed?: No  Information entered by :: alia t/cma   Activities of Daily Living     12/04/2023    1:26 PM  In your present state of health, do you have any difficulty performing the following activities:  Hearing? 0  Vision? 0  Difficulty concentrating or making decisions? 0  Walking or climbing stairs? 1  Dressing or bathing? 0  Doing errands, shopping? 0  Preparing Food and eating ? N  Using the Toilet? N  In the past six months, have you accidently leaked urine? N  Do you have problems with loss of bowel control? N  Managing  your Medications? N  Managing your Finances? N  Housekeeping or managing your Housekeeping? N    Patient Care Team: Gladis Mustard, FNP as PCP - General (Family Medicine) Mallipeddi, Diannah SQUIBB, MD as PCP - Cardiology (Cardiology)  I have updated your Care Teams any recent Medical Services you may have received from other providers in the past year.     Assessment:   This is a routine wellness examination for Barbara Boyd.  Hearing/Vision screen Hearing Screening - Comments:: Pt denies hearing dif Vision Screening - Comments:: Pt denies vision dif/pt goes to [place]/couple years ago   Goals Addressed             This Visit's Progress    DIET - REDUCE CALORIE INTAKE   On track    Pt would like to lose weight       Depression Screen     12/04/2023    1:29 PM 11/28/2023   11:20 AM 06/02/2023   11:42 AM 12/24/2022    8:01 AM 11/28/2022   11:22 AM 06/04/2022    1:22 PM 05/30/2022   10:06 AM  PHQ 2/9 Scores  PHQ - 2 Score 0 0 0 0 0 0 0  PHQ- 9 Score    3 3 0 2    Fall Risk     12/04/2023    1:24 PM 11/28/2023   11:20 AM 06/02/2023   11:42 AM 12/24/2022    8:01 AM 11/28/2022   11:22 AM  Fall Risk   Falls in the past year? 0 0 1 1 0  Number falls in past yr: 0  0 0   Injury with Fall? 0  0 1   Risk for fall due to : No Fall Risks  History of fall(s) Impaired balance/gait   Follow up Falls evaluation completed  Education provided Falls evaluation completed;Education provided     MEDICARE RISK AT HOME:  Medicare Risk at Home Any stairs in or around the home?: No If so, are there any without handrails?:  No Home free of loose throw rugs in walkways, pet beds, electrical cords, etc?: Yes Adequate lighting in your home to reduce risk of falls?: Yes Life alert?: No Use of a cane, walker or w/c?: No Grab bars in the bathroom?: Yes Shower chair or bench in shower?: Yes Elevated toilet seat or a handicapped toilet?: Yes  TIMED UP AND GO:  Was the test performed?   no  Cognitive Function: 6CIT completed        12/04/2023    1:28 PM 06/04/2022    1:23 PM  6CIT Screen  What Year? 0 points 0 points  What month? 0 points 0 points  What time? 0 points 0 points  Count back from 20 0 points 0 points  Months in reverse 0 points 0 points  Repeat phrase 0 points 0 points  Total Score 0 points 0 points    Immunizations Immunization History  Administered Date(s) Administered   Fluad Quad(high Dose 65+) 03/10/2019, 05/17/2020, 05/30/2022   Fluad Trivalent(High Dose 65+) 06/02/2023   Influenza, High Dose Seasonal PF 03/13/2016, 01/27/2018, 05/07/2021   Influenza,inj,Quad PF,6+ Mos 03/17/2015   Influenza-Unspecified 01/27/2018   Moderna SARS-COV2 Booster Vaccination 05/17/2020   Moderna Sars-Covid-2 Vaccination 07/22/2019, 08/27/2019   Pneumococcal Conjugate-13 07/24/2015   Pneumococcal Polysaccharide-23 08/26/2016   Tdap 04/27/2019   Zoster Recombinant(Shingrix) 05/07/2021   Zoster, Live 07/24/2015    Screening Tests Health Maintenance  Topic Date Due   Zoster Vaccines- Shingrix (2 of 2) 07/02/2021   COVID-19 Vaccine (3 - Moderna risk series) 12/14/2023 (Originally 06/14/2020)   INFLUENZA VACCINE  12/19/2023   Lung Cancer Screening  09/09/2024   Medicare Annual Wellness (AWV)  12/03/2024   Colonoscopy  01/12/2025   DTaP/Tdap/Td (2 - Td or Tdap) 04/26/2029   Pneumococcal Vaccine: 50+ Years  Completed   DEXA SCAN  Completed   Hepatitis C Screening  Completed   Hepatitis B Vaccines  Aged Out   HPV VACCINES  Aged Out   Meningococcal B Vaccine  Aged Out    Health Maintenance  Health Maintenance Due  Topic Date Due   Zoster Vaccines- Shingrix (2 of 2) 07/02/2021   Health Maintenance Items Addressed: See Nurse Notes at the end of this note  Additional Screening:  Vision Screening: Recommended annual ophthalmology exams for early detection of glaucoma and other disorders of the eye. Would you like a referral to an eye doctor? No     Dental Screening: Recommended annual dental exams for proper oral hygiene  Community Resource Referral / Chronic Care Management: CRR required this visit?  No   CCM required this visit?  No   Plan:    I have personally reviewed and noted the following in the patient's chart:   Medical and social history Use of alcohol, tobacco or illicit drugs  Current medications and supplements including opioid prescriptions. Patient is not currently taking opioid prescriptions. Functional ability and status Nutritional status Physical activity Advanced directives List of other physicians Hospitalizations, surgeries, and ER visits in previous 12 months Vitals Screenings to include cognitive, depression, and falls Referrals and appointments  In addition, I have reviewed and discussed with patient certain preventive protocols, quality metrics, and best practice recommendations. A written personalized care plan for preventive services as well as general preventive health recommendations were provided to patient.   Barbara Boyd, CMA   12/04/2023   After Visit Summary: (MyChart) Due to this being a telephonic visit, the after visit summary with patients personalized plan was offered to  patient via MyChart   Notes: Pt is due and aware of 2nd shingles vaccine

## 2023-12-05 ENCOUNTER — Other Ambulatory Visit: Payer: Self-pay | Admitting: Nurse Practitioner

## 2023-12-22 ENCOUNTER — Encounter: Payer: Self-pay | Admitting: Internal Medicine

## 2023-12-22 ENCOUNTER — Ambulatory Visit: Attending: Internal Medicine | Admitting: Internal Medicine

## 2023-12-22 VITALS — BP 122/80 | HR 77 | Ht 62.0 in | Wt 176.8 lb

## 2023-12-22 DIAGNOSIS — F17200 Nicotine dependence, unspecified, uncomplicated: Secondary | ICD-10-CM

## 2023-12-22 DIAGNOSIS — I10 Essential (primary) hypertension: Secondary | ICD-10-CM | POA: Diagnosis not present

## 2023-12-22 DIAGNOSIS — R0609 Other forms of dyspnea: Secondary | ICD-10-CM | POA: Diagnosis not present

## 2023-12-22 DIAGNOSIS — Z8249 Family history of ischemic heart disease and other diseases of the circulatory system: Secondary | ICD-10-CM

## 2023-12-22 NOTE — Progress Notes (Signed)
 Cardiology Office Note  Date: 12/22/2023   ID: Barbara Boyd, DOB Jul 30, 1950, MRN [id number]  PCP:  Gladis Mustard, FNP  Cardiologist:  Diannah SHAUNNA Maywood, MD Electrophysiologist:  None   History of Present Illness: Barbara Boyd is a 73 y.o. female known to have HTN, HLD, COPD/asthma is here for follow-up visit.  Her son and grandson are diagnosed with heart failure recently with ejection fraction less than 20%.  She is worried if she has heart failure as well.  She has ongoing DOE due to which echocardiogram was obtained in May 2025 that showed normal LVEF, G1 DD with normal LVEDP, CVP 3-minute mercury, normal RV function and no valvular heart disease.  Lexiscan  was also normal.  She continues to have DOE.  But no angina, dizziness, syncope, palpitations or leg swelling.  Past Medical History:  Diagnosis Date   Arthritis    Asthma    Cancer (HCC) 09/2013   left Br. CA, had bilat mastectomy and doing reconstruction now   Cataract    removed both    COPD (chronic obstructive pulmonary disease) (HCC)    GERD (gastroesophageal reflux disease)    Hyperlipidemia    Hypertension    Osteopenia    borderline   Thyroid  disease     Past Surgical History:  Procedure Laterality Date   BILATERAL TOTAL MASTECTOMY WITH AXILLARY LYMPH NODE DISSECTION Bilateral    BREAST RECONSTRUCTION     COLONOSCOPY     POLYPECTOMY      Current Outpatient Medications  Medication Sig Dispense Refill   escitalopram  (LEXAPRO ) 20 MG tablet Take 1 tablet (20 mg total) by mouth daily. 90 tablet 1   fluticasone -salmeterol (ADVAIR DISKUS) 250-50 MCG/ACT AEPB Inhale 1 puff into the lungs in the morning and at bedtime. 3 each 5   hydrochlorothiazide  (HYDRODIURIL ) 25 MG tablet Take 1 tablet (25 mg total) by mouth daily. 90 tablet 1   meloxicam  (MOBIC ) 15 MG tablet TAKE 1 TABLET (15 MG TOTAL) BY MOUTH DAILY. 90 tablet 1   methimazole  (TAPAZOLE ) 5 MG tablet TAKE 1/2 TABLET BY MOUTH DAILY 90 tablet 1    omeprazole  (PRILOSEC) 20 MG capsule Take 1 capsule (20 mg total) by mouth 2 (two) times daily before a meal. 180 capsule 1   simvastatin  (ZOCOR ) 40 MG tablet TAKE 1 TABLET BY MOUTH EVERY DAY 90 tablet 1   SUMAtriptan  (IMITREX ) 100 MG tablet TAKE 1 TABLET (100 MG TOTAL) AS NEEDED. MAY REPEAT IN 2 HOURS IF HEADACHE PERSISTS OR RECURS. 9 tablet 3   tiotropium (SPIRIVA  HANDIHALER) 18 MCG inhalation capsule Place 1 capsule (18 mcg total) into inhaler and inhale daily. 30 capsule 5   tolterodine  (DETROL  LA) 4 MG 24 hr capsule Take 1 capsule (4 mg total) by mouth daily. 90 capsule 1   Vitamin D , Ergocalciferol , (DRISDOL ) 1.25 MG (50000 UNIT) CAPS capsule TAKE 1 CAPSULE (50,000 UNITS TOTAL) BY MOUTH EVERY 7 (SEVEN) DAYS 12 capsule 1   zolpidem  (AMBIEN ) 10 MG tablet Take 1 tablet (10 mg total) by mouth at bedtime as needed for sleep. 30 tablet 5   No current facility-administered medications for this visit.   Allergies:  Sulfa antibiotics, Aspirin, and Penicillins   Social History: The patient  reports that she has been smoking cigarettes. She started smoking about 50 years ago. She has a 101.8 pack-year smoking history. She has never used smokeless tobacco. She reports that she does not drink alcohol and does not use drugs.   Family History:  The patient's family history includes Arthritis in her mother; Breast cancer in her maternal aunt, maternal aunt, maternal grandmother, and paternal aunt; Dementia in her mother; Heart failure in her brother and brother; Hyperlipidemia in her mother and sister; Hypertension in her brother, maternal grandmother, and sister; Lung cancer in her father; Osteoporosis in her maternal grandmother and mother.   ROS:  Please see the history of present illness. Otherwise, complete review of systems is positive for none  All other systems are reviewed and negative.   Physical Exam: VS:  BP 122/80   Pulse 77   Ht 5' 2 (1.575 m)   Wt 176 lb 12.8 oz (80.2 kg)   SpO2 98%    BMI 32.34 kg/m , BMI Body mass index is 32.34 kg/m.  Wt Readings from Last 3 Encounters:  12/22/23 176 lb 12.8 oz (80.2 kg)  12/04/23 176 lb (79.8 kg)  11/28/23 176 lb (79.8 kg)    General: Patient appears comfortable at rest. HEENT: Conjunctiva and lids normal, oropharynx clear with moist mucosa. Neck: Supple, no elevated JVP or carotid bruits, no thyromegaly. Lungs: Bilateral wheezing and rhonchi present Cardiac: Regular rate and rhythm, no S3 or significant systolic murmur, no pericardial rub. Abdomen: Soft, nontender, no hepatomegaly, bowel sounds present, no guarding or rebound. Extremities: No pitting edema, distal pulses 2+. Skin: Warm and dry. Musculoskeletal: No kyphosis. Neuropsychiatric: Alert and oriented x3, affect grossly appropriate.  Recent Labwork: 06/02/2023: TSH 2.020 11/28/2023: ALT 11; AST 19; BUN 12; Creatinine, Ser 0.80; Hemoglobin 13.3; Platelets 322; Potassium 4.9; Sodium 131     Component Value Date/Time   CHOL 140 11/28/2023 1148   CHOL 152 10/23/2012 1648   TRIG 56 11/28/2023 1148   TRIG 57 08/01/2014 0953   TRIG 67 10/23/2012 1648   HDL 53 11/28/2023 1148   HDL 66 08/01/2014 0953   HDL 59 10/23/2012 1648   CHOLHDL 2.6 11/28/2023 1148   LDLCALC 75 11/28/2023 1148   LDLCALC 114 (H) 01/31/2014 1158   LDLCALC 80 10/23/2012 1648     Assessment and Plan:  Family history of CHF: Her son and grandson are diagnosed with heart failure with ejection fraction less than 20%.  She has symptoms of DOE for many years/worsening in the last 1 year.  Echocardiogram is unremarkable.  DOE: Ongoing for many years but worsening in the last 1 year.  Lexiscan  is normal.  DOE secondary lung pathology, COPD/asthma.  HTN, controlled: Continue HCTZ 25 mg once daily.  HLD, at goal: Continue simvastatin  40 mg nightly.  Goal LDL less than 100.  Nicotine abuse: Current smoker.  Counseling provided.       Medication Adjustments/Labs and Tests Ordered: Current  medicines are reviewed at length with the patient today.  Concerns regarding medicines are outlined above.    Disposition:  Follow up as needed  Signed Barbara Town Arleta Maywood, MD, 12/22/2023 1:15 PM    [facility] at [facility] [address] [place], [place], [place] [address]

## 2023-12-22 NOTE — Patient Instructions (Addendum)

## 2024-05-19 ENCOUNTER — Other Ambulatory Visit: Payer: Self-pay | Admitting: Nurse Practitioner

## 2024-05-28 ENCOUNTER — Encounter: Payer: Self-pay | Admitting: Nurse Practitioner

## 2024-05-28 ENCOUNTER — Ambulatory Visit: Payer: Self-pay | Admitting: Nurse Practitioner

## 2024-05-28 ENCOUNTER — Telehealth: Payer: Self-pay | Admitting: Nurse Practitioner

## 2024-05-28 VITALS — BP 124/70 | HR 74 | Temp 97.5°F | Ht 62.0 in | Wt 175.0 lb

## 2024-05-28 DIAGNOSIS — R3915 Urgency of urination: Secondary | ICD-10-CM

## 2024-05-28 DIAGNOSIS — J4489 Other specified chronic obstructive pulmonary disease: Secondary | ICD-10-CM

## 2024-05-28 DIAGNOSIS — I10 Essential (primary) hypertension: Secondary | ICD-10-CM

## 2024-05-28 DIAGNOSIS — E559 Vitamin D deficiency, unspecified: Secondary | ICD-10-CM | POA: Diagnosis not present

## 2024-05-28 DIAGNOSIS — E0500 Thyrotoxicosis with diffuse goiter without thyrotoxic crisis or storm: Secondary | ICD-10-CM | POA: Diagnosis not present

## 2024-05-28 DIAGNOSIS — E782 Mixed hyperlipidemia: Secondary | ICD-10-CM

## 2024-05-28 DIAGNOSIS — Z716 Tobacco abuse counseling: Secondary | ICD-10-CM | POA: Diagnosis not present

## 2024-05-28 DIAGNOSIS — K219 Gastro-esophageal reflux disease without esophagitis: Secondary | ICD-10-CM | POA: Diagnosis not present

## 2024-05-28 DIAGNOSIS — Z6830 Body mass index (BMI) 30.0-30.9, adult: Secondary | ICD-10-CM

## 2024-05-28 DIAGNOSIS — M8588 Other specified disorders of bone density and structure, other site: Secondary | ICD-10-CM

## 2024-05-28 DIAGNOSIS — R300 Dysuria: Secondary | ICD-10-CM

## 2024-05-28 DIAGNOSIS — F3342 Major depressive disorder, recurrent, in full remission: Secondary | ICD-10-CM | POA: Diagnosis not present

## 2024-05-28 DIAGNOSIS — F5101 Primary insomnia: Secondary | ICD-10-CM

## 2024-05-28 LAB — URINALYSIS, ROUTINE W REFLEX MICROSCOPIC
Bilirubin, UA: NEGATIVE
Glucose, UA: NEGATIVE
Ketones, UA: NEGATIVE
Leukocytes,UA: NEGATIVE
Nitrite, UA: NEGATIVE
Protein,UA: NEGATIVE
Specific Gravity, UA: 1.02 (ref 1.005–1.030)
Urobilinogen, Ur: 0.2 mg/dL (ref 0.2–1.0)
pH, UA: 7.5 (ref 5.0–7.5)

## 2024-05-28 LAB — MICROSCOPIC EXAMINATION
Epithelial Cells (non renal): NONE SEEN /HPF (ref 0–10)
Renal Epithel, UA: NONE SEEN /HPF
Yeast, UA: NONE SEEN

## 2024-05-28 MED ORDER — TIOTROPIUM BROMIDE 18 MCG IN CAPS: 18.0000 ug | ORAL_CAPSULE | Freq: Every day | RESPIRATORY_TRACT | 1 refills | Status: AC

## 2024-05-28 MED ORDER — ZOLPIDEM TARTRATE 10 MG PO TABS: 10.0000 mg | ORAL_TABLET | Freq: Every evening | ORAL | 5 refills | Status: AC | PRN

## 2024-05-28 MED ORDER — HYDROCHLOROTHIAZIDE 25 MG PO TABS: 25.0000 mg | ORAL_TABLET | Freq: Every day | ORAL | 1 refills | Status: AC

## 2024-05-28 MED ORDER — OMEPRAZOLE 20 MG PO CPDR: 20.0000 mg | DELAYED_RELEASE_CAPSULE | Freq: Two times a day (BID) | ORAL | 1 refills | Status: AC

## 2024-05-28 MED ORDER — TOLTERODINE TARTRATE ER 4 MG PO CP24: 4.0000 mg | ORAL_CAPSULE | Freq: Every day | ORAL | 1 refills | Status: AC

## 2024-05-28 MED ORDER — SIMVASTATIN 40 MG PO TABS: ORAL_TABLET | ORAL | 1 refills | Status: AC

## 2024-05-28 MED ORDER — METHIMAZOLE 5 MG PO TABS: ORAL_TABLET | ORAL | 1 refills | Status: AC

## 2024-05-28 MED ORDER — ESCITALOPRAM OXALATE 20 MG PO TABS: 20.0000 mg | ORAL_TABLET | Freq: Every day | ORAL | 1 refills | Status: AC

## 2024-05-28 MED ORDER — FLUTICASONE-SALMETEROL 250-50 MCG/ACT IN AEPB: 1.0000 | INHALATION_SPRAY | Freq: Two times a day (BID) | RESPIRATORY_TRACT | 5 refills | Status: AC

## 2024-05-28 NOTE — Addendum Note (Signed)
 Addended by: Barney Gertsch, MANDY G on: 05/28/2024 02:31 PM   Modules accepted: Orders

## 2024-05-28 NOTE — Patient Instructions (Signed)

## 2024-05-28 NOTE — Progress Notes (Signed)
 "  Subjective:    Patient ID: Barbara Boyd, female    DOB: 15-Feb-1951, 74 y.o.   MRN: [id number]   Chief Complaint: medical management of chronic issues     HPI:  Barbara Boyd is a 74 y.o. who identifies as a female who was assigned female at birth.   Social history: Lives with: husband Work history: retired   Water Engineer in today for follow up of the following chronic medical issues:  1. Primary hypertension No c/o chest pain, sob or headache. Does not check blood pressure at home. BP Readings from Last 3 Encounters:  05/28/24 124/70  12/22/23 122/80  12/04/23 134/76      2. Mixed hyperlipidemia Does try to watch diet. Does no dedicated exercise. Lab Results  Component Value Date   CHOL 140 11/28/2023   HDL 53 11/28/2023   LDLCALC 75 11/28/2023   TRIG 56 11/28/2023   CHOLHDL 2.6 11/28/2023   The 10-year ASCVD risk score (Arnett DK, et al., 2019) is: 22.6%    3. Gastroesophageal reflux disease without esophagitis Is on omeprazole  daily and is doing well.  4. COPD with chronic bronchitis Is on spirivia daily as well as advair. She wheezes a lot but refuses to quit smoking. Has not been using spirivia much lately  5. Graves disease No issues that she is aware of. Is ion tapazole  daily Lab Results  Component Value Date   TSH 2.020 06/02/2023     6. Urinary urgency Is on detrol  L A which helps some.  7. Tobacco abuse counseling No desire  to quit smoking. Smokes over 1 pack a day. Last low dose CT scan was done  on 09/10/23 and was clear  8. Vitamin D  deficiency Is on weekly vitamin d  supplement  9. Osteopenia of lumbar spine Last dexascan was done on   10. Recurrent major depressive disorder, in full remission (HCC) Has been on lexapro  for several years. Says she is doing well.    05/28/2024   12:01 PM 12/04/2023    1:29 PM 11/28/2023   11:20 AM  Depression screen PHQ 2/9  Decreased Interest 0 0 0  Down, Depressed, Hopeless 0 0 0  PHQ - 2 Score 0 0  0  Altered sleeping 2    Tired, decreased energy 1    Change in appetite 0    Feeling bad or failure about yourself  0    Trouble concentrating 0    Moving slowly or fidgety/restless 0    Suicidal thoughts 0    PHQ-9 Score 3    Difficult doing work/chores Not difficult at all      11. Primary insomnia Is on ambien  to sleep and is not able to sleep without taking.  12. BMI 30.0-30.9,adult No recent weight changes.  Wt Readings from Last 3 Encounters:  05/28/24 175 lb (79.4 kg)  12/22/23 176 lb 12.8 oz (80.2 kg)  12/04/23 176 lb (79.8 kg)   BMI Readings from Last 3 Encounters:  05/28/24 32.01 kg/m  12/22/23 32.34 kg/m  12/04/23 32.19 kg/m      New complaints: None today  Allergies  Allergen Reactions   Sulfa Antibiotics Hives   Aspirin Swelling and Rash    Swelling of face and throat   Penicillins Hives and Rash   Outpatient Encounter Medications as of 05/28/2024  Medication Sig   escitalopram  (LEXAPRO ) 20 MG tablet Take 1 tablet (20 mg total) by mouth daily.   fluticasone -salmeterol (ADVAIR DISKUS) 250-50 MCG/ACT AEPB Inhale  1 puff into the lungs in the morning and at bedtime.   hydrochlorothiazide  (HYDRODIURIL ) 25 MG tablet Take 1 tablet (25 mg total) by mouth daily.   meloxicam  (MOBIC ) 15 MG tablet TAKE 1 TABLET (15 MG TOTAL) BY MOUTH DAILY.   methimazole  (TAPAZOLE ) 5 MG tablet TAKE 1/2 TABLET BY MOUTH DAILY   omeprazole  (PRILOSEC) 20 MG capsule Take 1 capsule (20 mg total) by mouth 2 (two) times daily before a meal.   simvastatin  (ZOCOR ) 40 MG tablet TAKE 1 TABLET BY MOUTH EVERY DAY   SUMAtriptan  (IMITREX ) 100 MG tablet TAKE 1 TABLET (100 MG TOTAL) AS NEEDED. MAY REPEAT IN 2 HOURS IF HEADACHE PERSISTS OR RECURS.   tiotropium (SPIRIVA  HANDIHALER) 18 MCG inhalation capsule Place 1 capsule (18 mcg total) into inhaler and inhale daily.   tolterodine  (DETROL  LA) 4 MG 24 hr capsule Take 1 capsule (4 mg total) by mouth daily.   zolpidem  (AMBIEN ) 10 MG tablet Take 1  tablet (10 mg total) by mouth at bedtime as needed for sleep.   Vitamin D , Ergocalciferol , (DRISDOL ) 1.25 MG (50000 UNIT) CAPS capsule TAKE 1 CAPSULE (50,000 UNITS TOTAL) BY MOUTH EVERY 7 (SEVEN) DAYS (Patient not taking: Reported on 05/28/2024)   No facility-administered encounter medications on file as of 05/28/2024.    Past Surgical History:  Procedure Laterality Date   BILATERAL TOTAL MASTECTOMY WITH AXILLARY LYMPH NODE DISSECTION Bilateral    BREAST RECONSTRUCTION     COLONOSCOPY     POLYPECTOMY      Family History  Problem Relation Age of Onset   Arthritis Mother    Osteoporosis Mother    Dementia Mother    Hyperlipidemia Mother    Lung cancer Father    Osteoporosis Maternal Grandmother    Breast cancer Maternal Grandmother    Hypertension Maternal Grandmother    Breast cancer Maternal Aunt    Breast cancer Maternal Aunt    Breast cancer Paternal Aunt    Heart failure Brother    Heart failure Brother    Hyperlipidemia Sister    Hypertension Sister    Hypertension Brother    Colon cancer Neg Hx    Rectal cancer Neg Hx    Stomach cancer Neg Hx    Colon polyps Neg Hx    Heart disease Neg Hx       Controlled substance contract: 11/28/22- drug screen today     Review of Systems  Constitutional:  Negative for diaphoresis.  Eyes:  Negative for pain.  Respiratory:  Negative for shortness of breath.   Cardiovascular:  Negative for chest pain, palpitations and leg swelling.  Gastrointestinal:  Negative for abdominal pain.  Endocrine: Negative for polydipsia.  Skin:  Negative for rash.  Neurological:  Negative for dizziness, weakness and headaches.  Hematological:  Does not bruise/bleed easily.  All other systems reviewed and are negative.      Objective:   Physical Exam Vitals and nursing note reviewed.  Constitutional:      General: She is not in acute distress.    Appearance: Normal appearance. She is well-developed.  HENT:     Head: Normocephalic.      Right Ear: Tympanic membrane normal.     Left Ear: Tympanic membrane normal.     Nose: Nose normal.     Mouth/Throat:     Mouth: Mucous membranes are moist.  Eyes:     Pupils: Pupils are equal, round, and reactive to light.  Neck:     Vascular: No carotid bruit  or JVD.  Cardiovascular:     Rate and Rhythm: Normal rate and regular rhythm.     Heart sounds: Normal heart sounds.  Pulmonary:     Effort: Pulmonary effort is normal. No respiratory distress.     Breath sounds: Wheezing and rhonchi (that clear with coughing) present. No rales.  Chest:     Chest wall: No tenderness.  Abdominal:     General: Bowel sounds are normal. There is no distension or abdominal bruit.     Palpations: Abdomen is soft. There is no hepatomegaly, splenomegaly, mass or pulsatile mass.     Tenderness: There is no abdominal tenderness.  Musculoskeletal:        General: Normal range of motion.     Cervical back: Normal range of motion and neck supple.  Lymphadenopathy:     Cervical: No cervical adenopathy.  Skin:    General: Skin is warm and dry.  Neurological:     Mental Status: She is alert and oriented to person, place, and time.     Deep Tendon Reflexes: Reflexes are normal and symmetric.  Psychiatric:        Behavior: Behavior normal.        Thought Content: Thought content normal.        Judgment: Judgment normal.    BP 124/70   Pulse 74   Temp (!) 97.5 F (36.4 C) (Temporal)   Ht 5' 2 (1.575 m)   Wt 175 lb (79.4 kg)   SpO2 96%   BMI 32.01 kg/m        Assessment & Plan:  Liany Mumpower Treptow comes in today with chief complaint of Medical Management of Chronic Issues   Diagnosis and orders addressed:  1. Essential hypertension (Primary) Low sodium diet - hydrochlorothiazide  (HYDRODIURIL ) 25 MG tablet; Take 1 tablet (25 mg total) by mouth daily.  Dispense: 90 tablet; Refill: 1 - CBC with Differential/Platelet - CMP14+EGFR  2. Mixed hyperlipidemia Low fat diet - simvastatin  (ZOCOR )  40 MG tablet; TAKE 1 TABLET BY MOUTH EVERY DAY  Dispense: 90 tablet; Refill: 1 - Lipid panel  3. Gastroesophageal reflux disease without esophagitis Avoid spicy foods Do not eat 2 hours prior to bedtime - omeprazole  (PRILOSEC) 20 MG capsule; Take 1 capsule (20 mg total) by mouth 2 (two) times daily before a meal.  Dispense: 180 capsule; Refill: 1  4. COPD with chronic bronchitis (HCC) Smoking cessation - fluticasone -salmeterol (ADVAIR DISKUS) 250-50 MCG/ACT AEPB; Inhale 1 puff into the lungs in the morning and at bedtime.  Dispense: 3 each; Refill: 5 - tiotropium (SPIRIVA  HANDIHALER) 18 MCG inhalation capsule; Place 1 capsule (18 mcg total) into inhaler and inhale daily.  Dispense: 30 capsule; Refill: 5  5. Graves disease Labs pending - methimazole  (TAPAZOLE ) 5 MG tablet; TAKE 1/2 TABLET BY MOUTH DAILY  Dispense: 90 tablet; Refill: 1 - Thyroid  Panel With TSH  6. Urinary urgency - tolterodine  (DETROL  LA) 4 MG 24 hr capsule; Take 1 capsule (4 mg total) by mouth daily.  Dispense: 90 capsule; Refill: 1  7. Tobacco abuse counseling Smoking cessation encuraged  8. Vitamin D  deficiency Continue vitamin d  supplement  9. Osteopenia of lumbar spine Weight bearing exercises  10. Recurrent major depressive disorder, in full remission (HCC) Stress management - escitalopram  (LEXAPRO ) 20 MG tablet; Take 1 tablet (20 mg total) by mouth daily.  Dispense: 90 tablet; Refill: 1  11. Primary insomnia Bedtime routine - zolpidem  (AMBIEN ) 10 MG tablet; Take 1 tablet (10 mg total) by mouth  at bedtime as needed for sleep.  Dispense: 30 tablet; Refill: 5  12. BMI 30.0-30.9,adult Discussed diet and exercise for person with BMI >25 Will recheck weight in 3-6 months    Labs pending Health Maintenance reviewed Diet and exercise encouraged  Follow up plan: 6 months   Mary-Margaret Gladis, FNP  "

## 2024-05-28 NOTE — Telephone Encounter (Signed)
Dexa app't

## 2024-05-29 LAB — CBC WITH DIFFERENTIAL/PLATELET
Basophils Absolute: 0.1 x10E3/uL (ref 0.0–0.2)
Basos: 1 %
EOS (ABSOLUTE): 0.2 x10E3/uL (ref 0.0–0.4)
Eos: 3 %
Hematocrit: 41.8 % (ref 34.0–46.6)
Hemoglobin: 14.2 g/dL (ref 11.1–15.9)
Immature Grans (Abs): 0 x10E3/uL (ref 0.0–0.1)
Immature Granulocytes: 0 %
Lymphocytes Absolute: 2.2 x10E3/uL (ref 0.7–3.1)
Lymphs: 32 %
MCH: 33.7 pg — ABNORMAL HIGH (ref 26.6–33.0)
MCHC: 34 g/dL (ref 31.5–35.7)
MCV: 99 fL — ABNORMAL HIGH (ref 79–97)
Monocytes Absolute: 0.7 x10E3/uL (ref 0.1–0.9)
Monocytes: 10 %
Neutrophils Absolute: 3.7 x10E3/uL (ref 1.4–7.0)
Neutrophils: 54 %
Platelets: 305 x10E3/uL (ref 150–450)
RBC: 4.21 x10E6/uL (ref 3.77–5.28)
RDW: 11.7 % (ref 11.7–15.4)
WBC: 6.8 x10E3/uL (ref 3.4–10.8)

## 2024-05-29 LAB — THYROID PANEL WITH TSH
Free Thyroxine Index: 2.8 (ref 1.2–4.9)
T3 Uptake Ratio: 32 % (ref 24–39)
T4, Total: 8.6 ug/dL (ref 4.5–12.0)
TSH: 1.62 u[IU]/mL (ref 0.450–4.500)

## 2024-05-29 LAB — CMP14+EGFR
ALT: 14 IU/L (ref 0–32)
AST: 22 IU/L (ref 0–40)
Albumin: 4.1 g/dL (ref 3.8–4.8)
Alkaline Phosphatase: 95 IU/L (ref 49–135)
BUN/Creatinine Ratio: 23 (ref 12–28)
BUN: 19 mg/dL (ref 8–27)
Bilirubin Total: 0.4 mg/dL (ref 0.0–1.2)
CO2: 26 mmol/L (ref 20–29)
Calcium: 9.5 mg/dL (ref 8.7–10.3)
Chloride: 94 mmol/L — ABNORMAL LOW (ref 96–106)
Creatinine, Ser: 0.81 mg/dL (ref 0.57–1.00)
Globulin, Total: 2.4 g/dL (ref 1.5–4.5)
Glucose: 98 mg/dL (ref 70–99)
Potassium: 4.7 mmol/L (ref 3.5–5.2)
Sodium: 132 mmol/L — ABNORMAL LOW (ref 134–144)
Total Protein: 6.5 g/dL (ref 6.0–8.5)
eGFR: 77 mL/min/1.73

## 2024-05-29 LAB — LIPID PANEL
Chol/HDL Ratio: 2.7 ratio (ref 0.0–4.4)
Cholesterol, Total: 161 mg/dL (ref 100–199)
HDL: 60 mg/dL
LDL Chol Calc (NIH): 88 mg/dL (ref 0–99)
Triglycerides: 68 mg/dL (ref 0–149)
VLDL Cholesterol Cal: 13 mg/dL (ref 5–40)

## 2024-05-31 LAB — URINE CULTURE

## 2024-06-01 ENCOUNTER — Ambulatory Visit: Payer: Self-pay | Admitting: Nurse Practitioner

## 2024-06-01 MED ORDER — CIPROFLOXACIN HCL 500 MG PO TABS: 500.0000 mg | ORAL_TABLET | Freq: Two times a day (BID) | ORAL | 0 refills | Status: AC

## 2024-06-01 NOTE — Telephone Encounter (Signed)
 Appt made for July following next OV

## 2024-06-03 ENCOUNTER — Other Ambulatory Visit: Payer: Self-pay | Admitting: *Deleted

## 2024-06-03 DIAGNOSIS — F17210 Nicotine dependence, cigarettes, uncomplicated: Secondary | ICD-10-CM

## 2024-06-03 DIAGNOSIS — Z122 Encounter for screening for malignant neoplasm of respiratory organs: Secondary | ICD-10-CM

## 2024-06-03 DIAGNOSIS — Z87891 Personal history of nicotine dependence: Secondary | ICD-10-CM

## 2024-11-23 ENCOUNTER — Other Ambulatory Visit

## 2024-11-23 ENCOUNTER — Ambulatory Visit: Admitting: Nurse Practitioner

## 2024-12-06 ENCOUNTER — Ambulatory Visit: Payer: Self-pay
